# Patient Record
Sex: Female | Born: 1935
Health system: Southern US, Community
[De-identification: ages and names within clinical notes are randomized; demographics above are authoritative.]

## PROBLEM LIST (undated history)

## (undated) DIAGNOSIS — K635 Polyp of colon: Secondary | ICD-10-CM

## (undated) DIAGNOSIS — Z803 Family history of malignant neoplasm of breast: Secondary | ICD-10-CM

## (undated) DIAGNOSIS — I499 Cardiac arrhythmia, unspecified: Secondary | ICD-10-CM

## (undated) DIAGNOSIS — R0989 Other specified symptoms and signs involving the circulatory and respiratory systems: Secondary | ICD-10-CM

## (undated) DIAGNOSIS — I4719 Other supraventricular tachycardia: Secondary | ICD-10-CM

## (undated) DIAGNOSIS — E785 Hyperlipidemia, unspecified: Secondary | ICD-10-CM

## (undated) DIAGNOSIS — I10 Essential (primary) hypertension: Secondary | ICD-10-CM

## (undated) DIAGNOSIS — Z800 Family history of malignant neoplasm of digestive organs: Secondary | ICD-10-CM

## (undated) DIAGNOSIS — I4819 Other persistent atrial fibrillation: Secondary | ICD-10-CM

## (undated) DIAGNOSIS — I471 Supraventricular tachycardia: Secondary | ICD-10-CM

## (undated) DIAGNOSIS — R112 Nausea with vomiting, unspecified: Secondary | ICD-10-CM

## (undated) DIAGNOSIS — M1990 Unspecified osteoarthritis, unspecified site: Secondary | ICD-10-CM

## (undated) DIAGNOSIS — Z9889 Other specified postprocedural states: Secondary | ICD-10-CM

## (undated) DIAGNOSIS — T8859XA Other complications of anesthesia, initial encounter: Secondary | ICD-10-CM

## (undated) HISTORY — DX: Family history of malignant neoplasm of digestive organs: Z80.0

## (undated) HISTORY — DX: Other specified symptoms and signs involving the circulatory and respiratory systems: R09.89

## (undated) HISTORY — DX: Supraventricular tachycardia: I47.1

## (undated) HISTORY — DX: Essential (primary) hypertension: I10

## (undated) HISTORY — PX: MOUTH SURGERY: SHX715

## (undated) HISTORY — PX: BREAST LUMPECTOMY: SHX2

## (undated) HISTORY — DX: Polyp of colon: K63.5

## (undated) HISTORY — PX: DILATION AND CURETTAGE OF UTERUS: SHX78

## (undated) HISTORY — DX: Family history of malignant neoplasm of breast: Z80.3

## (undated) HISTORY — DX: Other persistent atrial fibrillation: I48.19

## (undated) HISTORY — DX: Other supraventricular tachycardia: I47.19

## (undated) HISTORY — DX: Hyperlipidemia, unspecified: E78.5

## (undated) SURGERY — Surgical Case
Anesthesia: *Unknown

---

## 1999-06-13 ENCOUNTER — Other Ambulatory Visit: Admission: RE | Admit: 1999-06-13 | Discharge: 1999-06-13 | Payer: Self-pay | Admitting: *Deleted

## 2003-10-31 DIAGNOSIS — R0989 Other specified symptoms and signs involving the circulatory and respiratory systems: Secondary | ICD-10-CM

## 2003-10-31 HISTORY — DX: Other specified symptoms and signs involving the circulatory and respiratory systems: R09.89

## 2004-01-18 ENCOUNTER — Other Ambulatory Visit: Admission: RE | Admit: 2004-01-18 | Discharge: 2004-01-18 | Payer: Self-pay | Admitting: *Deleted

## 2004-02-05 ENCOUNTER — Encounter: Admission: RE | Admit: 2004-02-05 | Discharge: 2004-02-05 | Payer: Self-pay | Admitting: Internal Medicine

## 2004-10-30 DIAGNOSIS — K635 Polyp of colon: Secondary | ICD-10-CM

## 2004-10-30 HISTORY — PX: COLONOSCOPY W/ POLYPECTOMY: SHX1380

## 2004-10-30 HISTORY — DX: Polyp of colon: K63.5

## 2004-12-23 ENCOUNTER — Ambulatory Visit: Payer: Self-pay | Admitting: Internal Medicine

## 2005-01-24 ENCOUNTER — Ambulatory Visit: Payer: Self-pay | Admitting: Internal Medicine

## 2005-06-22 ENCOUNTER — Ambulatory Visit: Payer: Self-pay | Admitting: Internal Medicine

## 2005-07-25 ENCOUNTER — Ambulatory Visit: Payer: Self-pay | Admitting: Internal Medicine

## 2005-08-04 ENCOUNTER — Ambulatory Visit: Payer: Self-pay | Admitting: Internal Medicine

## 2005-08-15 ENCOUNTER — Ambulatory Visit: Payer: Self-pay | Admitting: Internal Medicine

## 2005-08-18 ENCOUNTER — Encounter (INDEPENDENT_AMBULATORY_CARE_PROVIDER_SITE_OTHER): Payer: Self-pay | Admitting: *Deleted

## 2005-08-18 ENCOUNTER — Ambulatory Visit: Payer: Self-pay | Admitting: Internal Medicine

## 2006-01-25 ENCOUNTER — Ambulatory Visit: Payer: Self-pay | Admitting: Internal Medicine

## 2006-02-23 ENCOUNTER — Ambulatory Visit: Payer: Self-pay | Admitting: Internal Medicine

## 2006-03-20 ENCOUNTER — Ambulatory Visit: Payer: Self-pay | Admitting: Internal Medicine

## 2006-09-03 ENCOUNTER — Ambulatory Visit: Payer: Self-pay | Admitting: Internal Medicine

## 2007-09-05 DIAGNOSIS — Z9889 Other specified postprocedural states: Secondary | ICD-10-CM

## 2007-09-05 DIAGNOSIS — R0989 Other specified symptoms and signs involving the circulatory and respiratory systems: Secondary | ICD-10-CM

## 2007-09-10 ENCOUNTER — Ambulatory Visit: Payer: Self-pay | Admitting: Internal Medicine

## 2007-09-13 DIAGNOSIS — M810 Age-related osteoporosis without current pathological fracture: Secondary | ICD-10-CM | POA: Insufficient documentation

## 2007-10-29 ENCOUNTER — Encounter: Payer: Self-pay | Admitting: Internal Medicine

## 2007-11-21 ENCOUNTER — Ambulatory Visit: Payer: Self-pay | Admitting: Internal Medicine

## 2007-11-21 DIAGNOSIS — I10 Essential (primary) hypertension: Secondary | ICD-10-CM | POA: Insufficient documentation

## 2007-11-21 DIAGNOSIS — E782 Mixed hyperlipidemia: Secondary | ICD-10-CM

## 2007-11-21 LAB — CONVERTED CEMR LAB
Cholesterol, target level: 200 mg/dL
LDL Goal: 160 mg/dL

## 2007-11-23 LAB — CONVERTED CEMR LAB
HDL: 55.1 mg/dL (ref >39.0)
TSH: 1.13 microintl units/mL (ref 0.35–5.50)
Total CHOL/HDL Ratio: 3.6
Triglycerides: 96 mg/dL (ref 0–149)
VLDL: 19 mg/dL (ref 0–40)
Vit D, 1,25-Dihydroxy: 31 (ref 30–89)

## 2007-11-25 ENCOUNTER — Encounter (INDEPENDENT_AMBULATORY_CARE_PROVIDER_SITE_OTHER): Payer: Self-pay | Admitting: *Deleted

## 2007-12-10 ENCOUNTER — Encounter (INDEPENDENT_AMBULATORY_CARE_PROVIDER_SITE_OTHER): Payer: Self-pay | Admitting: *Deleted

## 2007-12-10 ENCOUNTER — Ambulatory Visit: Payer: Self-pay | Admitting: Internal Medicine

## 2008-03-10 ENCOUNTER — Encounter (INDEPENDENT_AMBULATORY_CARE_PROVIDER_SITE_OTHER): Payer: Self-pay | Admitting: *Deleted

## 2008-04-15 ENCOUNTER — Encounter: Payer: Self-pay | Admitting: Internal Medicine

## 2008-04-15 ENCOUNTER — Ambulatory Visit: Payer: Self-pay | Admitting: Internal Medicine

## 2008-04-27 ENCOUNTER — Encounter (INDEPENDENT_AMBULATORY_CARE_PROVIDER_SITE_OTHER): Payer: Self-pay | Admitting: *Deleted

## 2008-05-12 ENCOUNTER — Ambulatory Visit: Payer: Self-pay | Admitting: Internal Medicine

## 2008-12-15 ENCOUNTER — Ambulatory Visit: Payer: Self-pay | Admitting: Internal Medicine

## 2008-12-15 DIAGNOSIS — Z860100 Personal history of colon polyps, unspecified: Secondary | ICD-10-CM | POA: Insufficient documentation

## 2008-12-15 DIAGNOSIS — H8110 Benign paroxysmal vertigo, unspecified ear: Secondary | ICD-10-CM

## 2008-12-15 DIAGNOSIS — R002 Palpitations: Secondary | ICD-10-CM

## 2008-12-23 ENCOUNTER — Ambulatory Visit: Payer: Self-pay | Admitting: Internal Medicine

## 2009-01-04 LAB — CONVERTED CEMR LAB
ALT: 17 units/L (ref 0–35)
AST: 22 units/L (ref 0–37)
Alkaline Phosphatase: 29 units/L — ABNORMAL LOW (ref 39–117)
Basophils Relative: 0.8 % (ref 0.0–3.0)
Bilirubin, Direct: 0.2 mg/dL (ref 0.0–0.3)
Cholesterol: 195 mg/dL (ref 0–200)
Creatinine, Ser: 0.8 mg/dL (ref 0.4–1.2)
Eosinophils Relative: 4.1 % (ref 0.0–5.0)
GFR calc Af Amer: 90 mL/min
GFR calc non Af Amer: 75 mL/min
HCT: 41.1 % (ref 36.0–46.0)
HDL: 57.7 mg/dL (ref >39.0)
LDL Cholesterol: 129 mg/dL — ABNORMAL HIGH (ref 0–99)
MCV: 90.4 fL (ref 78.0–100.0)
Monocytes Absolute: 0.4 10*3/uL (ref 0.1–1.0)
Monocytes Relative: 9.2 % (ref 3.0–12.0)
Neutro Abs: 1.7 10*3/uL (ref 1.4–7.7)
Neutrophils Relative %: 38.5 % — ABNORMAL LOW (ref 43.0–77.0)
Platelets: 189 10*3/uL (ref 150–400)
Potassium: 4.9 meq/L (ref 3.5–5.1)
RDW: 12.2 % (ref 11.5–14.6)
TSH: 1.88 microintl units/mL (ref 0.35–5.50)
Total Bilirubin: 1.1 mg/dL (ref 0.3–1.2)
Total CHOL/HDL Ratio: 3.4
Total Protein: 6.3 g/dL (ref 6.0–8.3)
Triglycerides: 44 mg/dL (ref 0–149)

## 2009-01-08 ENCOUNTER — Encounter (INDEPENDENT_AMBULATORY_CARE_PROVIDER_SITE_OTHER): Payer: Self-pay | Admitting: *Deleted

## 2009-01-08 ENCOUNTER — Telehealth (INDEPENDENT_AMBULATORY_CARE_PROVIDER_SITE_OTHER): Payer: Self-pay | Admitting: *Deleted

## 2009-02-23 ENCOUNTER — Telehealth (INDEPENDENT_AMBULATORY_CARE_PROVIDER_SITE_OTHER): Payer: Self-pay | Admitting: *Deleted

## 2009-03-15 ENCOUNTER — Ambulatory Visit: Payer: Self-pay | Admitting: Internal Medicine

## 2009-03-25 LAB — CONVERTED CEMR LAB
ALT: 15 units/L (ref 0–35)
AST: 19 units/L (ref 0–37)
Albumin: 3.5 g/dL (ref 3.5–5.2)
Alkaline Phosphatase: 32 units/L — ABNORMAL LOW (ref 39–117)
Cholesterol: 161 mg/dL (ref 0–200)
LDL Cholesterol: 85 mg/dL (ref 0–99)
Total CHOL/HDL Ratio: 3
Total Protein: 6.7 g/dL (ref 6.0–8.3)

## 2009-03-26 ENCOUNTER — Encounter (INDEPENDENT_AMBULATORY_CARE_PROVIDER_SITE_OTHER): Payer: Self-pay | Admitting: *Deleted

## 2009-07-19 ENCOUNTER — Telehealth (INDEPENDENT_AMBULATORY_CARE_PROVIDER_SITE_OTHER): Payer: Self-pay | Admitting: *Deleted

## 2009-12-16 ENCOUNTER — Ambulatory Visit: Payer: Self-pay | Admitting: Internal Medicine

## 2009-12-17 ENCOUNTER — Encounter (INDEPENDENT_AMBULATORY_CARE_PROVIDER_SITE_OTHER): Payer: Self-pay | Admitting: *Deleted

## 2009-12-22 ENCOUNTER — Ambulatory Visit: Payer: Self-pay | Admitting: Internal Medicine

## 2009-12-22 LAB — CONVERTED CEMR LAB: Vit D, 25-Hydroxy: 43 ng/mL (ref 30–89)

## 2009-12-30 LAB — CONVERTED CEMR LAB
AST: 27 units/L (ref 0–37)
Alkaline Phosphatase: 31 units/L — ABNORMAL LOW (ref 39–117)
Basophils Absolute: 0.1 10*3/uL (ref 0.0–0.1)
Basophils Relative: 1.7 % (ref 0.0–3.0)
CO2: 32 meq/L (ref 19–32)
Cholesterol: 165 mg/dL (ref 0–200)
Free T4: 0.9 ng/dL (ref 0.6–1.6)
HCT: 42.4 % (ref 36.0–46.0)
HDL: 63.8 mg/dL (ref >39.00)
Hemoglobin: 13.9 g/dL (ref 12.0–15.0)
LDL Cholesterol: 86 mg/dL (ref 0–99)
MCV: 93.6 fL (ref 78.0–100.0)
Potassium: 4.8 meq/L (ref 3.5–5.1)
RBC: 4.53 M/uL (ref 3.87–5.11)
RDW: 12.5 % (ref 11.5–14.6)
VLDL: 15.2 mg/dL (ref 0.0–40.0)
WBC: 3.9 10*3/uL — ABNORMAL LOW (ref 4.5–10.5)

## 2010-01-31 ENCOUNTER — Encounter: Payer: Self-pay | Admitting: Internal Medicine

## 2010-08-19 ENCOUNTER — Telehealth (INDEPENDENT_AMBULATORY_CARE_PROVIDER_SITE_OTHER): Payer: Self-pay | Admitting: *Deleted

## 2010-11-27 LAB — CONVERTED CEMR LAB: Cholesterol, target level: 200 mg/dL

## 2010-11-29 NOTE — Progress Notes (Signed)
Summary: Refill Request  Phone Note Refill Request Message from:  Pharmacy  Refills Requested: Medication #1:  PRAVASTATIN SODIUM 20 MG TABS 1 qhs [facility] [phone]   Method Requested: Fax to Local Pharmacy Initial call taken by: Shonna Chock CMA,  August 19, 2010 4:10 PM  Follow-up for Phone Call        Paper fax was signed and faxed back Follow-up by: Shonna Chock CMA,  August 19, 2010 4:16 PM    Prescriptions: PRAVASTATIN SODIUM 20 MG TABS (PRAVASTATIN SODIUM) 1 qhs  #90 x 1   Entered by:   Shonna Chock CMA   Authorized by:   Marga Melnick MD   Signed by:   Shonna Chock CMA on 08/19/2010   Method used:   Historical   RxID:   [id number]

## 2010-11-29 NOTE — Letter (Signed)
Summary: Primary Care Consult Scheduled Letter  [facility]  [address] [place], [place] [address]   Phone: [phone]  Fax: [phone]      12/17/2009 MRN: [id number]  Molly Tucker [address] [place], [place]  [address]    Dear Ms. Tant,    We have scheduled an appointment for you.  At the recommendation of Dr. Marga Melnick, we have scheduled you for a Bone Density Scan at [facility] on 04-28-2010 at 9:00am.  Their address is [address] [place], [place] [address]. The office phone number is [phone].  If this appointment day and time is not convenient for you, please feel free to call the office of the doctor you are being referred to at the number listed above and reschedule the appointment.     It is important for you to keep your scheduled appointments. We are here to make sure you are given good patient care.   Thank you,    Renee, Patient Care Coordinator  [facility]

## 2010-11-29 NOTE — Assessment & Plan Note (Signed)
Summary: cpx/kdc   Vital Signs:  Patient profile:   77 year old female Weight:      139.6 pounds BMI:     24.43 Temp:     98.7 degrees F oral Resp:     14 per minute BP sitting:   160 / 88  (left arm) Cuff size:   large  Vitals Entered By: Shonna Chock (December 16, 2009 2:17 PM)  CC: CPX: order for fasting labs(near future), General Medical Evaluation Comments REVIEWED MED LIST, PATIENT AGREED DOSE AND INSTRUCTION CORRECT    CC:  CPX: order for fasting labs(near future) and General Medical Evaluation.  History of Present Illness: Molly Tucker is here for a UHC/Medicare physical;her  main issue is arthralgias responsive to 2 Tylenol.  Allergies (verified): No Known Drug Allergies  Past History:  Past Medical History: Osteoporosis, S/ P bisphonates X 5 yrs carotid bruit right 2005 ,Carotid Doppler: < 39% occlusion Colonic polyps, hx of,hyperplastic 2006,Dr Gessner,due 2016 Hyperlipidemia, LDL goal =< 115  Past Surgical History: gravida 3, para 2 A 1 dilation and curettage, history of Colon polypectomy, 2006, repeat due ? 2016 , Dr Leone Payor Oral Surgery 2009-2010(implants) ,Dr Dereck Leep   Family History: father CAD, died age 46 P aunts colon cancer, possible breast cancer sister breast cancer, CAD, S/P stent 2009 aunts arthritis  Social History: Never Smoked Retired Married Alcohol use-no Regular exercise-yes:  walking 30 min once daily   Review of Systems  The patient denies anorexia, fever, weight loss, weight gain, vision loss, decreased hearing, hoarseness, chest pain, syncope, dyspnea on exertion, peripheral edema, prolonged cough, headaches, hemoptysis, abdominal pain, melena, hematochezia, severe indigestion/heartburn, hematuria, incontinence, suspicious skin lesions, unusual weight change, abnormal bleeding, enlarged lymph nodes, and angioedema.         Occasional palpitations, non exertional. Bladder prolapse  treated  with pessary Rxed by Gyn.  Physical  Exam  General:  Appears younger than age,well-nourished, alert,appropriate and cooperative throughout examination Head:  Normocephalic and atraumatic without obvious abnormalities. Eyes:  No corneal or conjunctival inflammation noted. Marland Kitchen Perrla. Funduscopic exam benign, without hemorrhages, exudates or papilledema. No lid lag Ears:  External ear exam shows no significant lesions or deformities.  Otoscopic examination reveals clear canals, tympanic membranes are intact bilaterally without bulging, retraction, inflammation or discharge. Hearing is grossly normal bilaterally. Nose:  External nasal examination shows no deformity or inflammation. Nasal mucosa are pink and moist without lesions or exudates. Mouth:  Oral mucosa and oropharynx without lesions or exudates.  Teeth in  excellent  repair. Neck:  No deformities, masses, or tenderness noted.Faint bruit on R Lungs:  Normal respiratory effort, chest expands symmetrically. Lungs are clear to auscultation, no crackles or wheezes. Heart:  normal rate and irregular rhythm.  S4 with slurring  Abdomen:  Bowel sounds positive,abdomen soft and non-tender without masses, organomegaly or hernias noted. Aorta palpabble w/o AAA Genitalia:  as per Gyn Msk:  No deformity or scoliosis noted of thoracic or lumbar spine.   Pulses:  R and L carotid,radial,dorsalis pedis and posterior tibial pulses are full and equal bilaterally Extremities:  No clubbing, cyanosis, edema  noted with normal full range of motion of all joints.  OA hand changes Neurologic:  alert & oriented X3 and DTRs symmetrical and normal.  No tremor Skin:  Intact without suspicious lesions or rashes Cervical Nodes:  No lymphadenopathy noted Axillary Nodes:  No palpable lymphadenopathy Psych:  Oriented X3, memory intact for recent and remote, and normally interactive.  Impression & Recommendations:  Problem # 1:  PREVENTIVE HEALTH CARE (ICD-V70.0)  Orders: EKG w/ Interpretation  (93000) Radiology Referral (Radiology)  Problem # 2:  PALPITATIONS (ICD-785.1) Assessment: Unchanged  Orders: EKG w/ Interpretation (93000)  Her updated medication list for this problem includes:    Metoprolol Tartrate 25 Mg Tabs (Metoprolol tartrate) .Marland Kitchen... 1 two times a day if bp > 140/90  Problem # 3:  ELEVATED BLOOD PRESSURE WITHOUT DIAGNOSIS OF HYPERTENSION (ICD-796.2)  Orders: EKG w/ Interpretation (93000)  Her updated medication list for this problem includes:    Metoprolol Tartrate 25 Mg Tabs (Metoprolol tartrate) .Marland Kitchen... 1 two times a day if bp > 140/90  Problem # 4:  OSTEOPOROSIS (ICD-733.00)  Orders: Radiology Referral (Radiology)  Problem # 5:  COLONIC POLYPS, HX OF (ICD-V12.72) as per Dr Leone Payor  Complete Medication List: 1)  Asa 81mg   .... 1 by mouth qd 2)  Slo Niacin 500mg   .... Once daily 3)  Calcium 600/vitamin D 600-400 Mg-unit Tabs (Calcium carbonate-vitamin d) .Marland Kitchen.. 1 by mouth two times a day 4)  Vitamin D 1000 Unit Caps (Cholecalciferol) .Marland Kitchen.. 1 by mouth once daily 5)  Pravastatin Sodium 20 Mg Tabs (Pravastatin sodium) .Marland Kitchen.. 1 qhs 6)  Metoprolol Tartrate 25 Mg Tabs (Metoprolol tartrate) .Marland Kitchen.. 1 two times a day if bp > 140/90  Patient Instructions: 1)  Check your Blood Pressure regularly. If it is above: 140/90 ON AVERAGE  you should  start Metoprolol 25 mg two times a day . Avoid stimulants as discussed. Please schedule fasting labs: 2)  BMP ; 3)  Hepatic Panel ; 4)  Lipid Panel;free T4; 5)  TSH ;vitamin D level; 6)  CBC w/ Diff prior to visit, ICD-9: Prescriptions: METOPROLOL TARTRATE 25 MG TABS (METOPROLOL TARTRATE) 1 two times a day if BP > 140/90  #60 x 5   Entered and Authorized by:   Marga Melnick MD   Signed by:   Marga Melnick MD on 12/16/2009   Method used:   Print then Give to Patient   RxID:   [id number]

## 2010-11-29 NOTE — Medication Information (Signed)
Summary: Possible Nonadherence with Metoprolol Tartrate/[facility]  Possible Nonadherence with Metoprolol Tartrate/[facility]   Imported By: Lanelle Bal 02/04/2010 10:54:29  _____________________________________________________________________  External Attachment:    Type:   Image     Comment:   External Document

## 2011-03-07 ENCOUNTER — Other Ambulatory Visit: Payer: Self-pay | Admitting: *Deleted

## 2011-03-07 MED ORDER — PRAVASTATIN SODIUM 20 MG PO TABS: 20.0000 mg | ORAL_TABLET | Freq: Every day | ORAL | Status: DC

## 2011-08-21 ENCOUNTER — Encounter: Payer: Self-pay | Admitting: Internal Medicine

## 2011-08-22 ENCOUNTER — Ambulatory Visit (INDEPENDENT_AMBULATORY_CARE_PROVIDER_SITE_OTHER): Payer: Medicare Other | Admitting: Internal Medicine

## 2011-08-22 ENCOUNTER — Encounter: Payer: Self-pay | Admitting: Internal Medicine

## 2011-08-22 VITALS — BP 148/90 | HR 79 | Temp 98.2°F | Resp 14 | Ht 64.0 in | Wt 138.8 lb

## 2011-08-22 DIAGNOSIS — Z23 Encounter for immunization: Secondary | ICD-10-CM

## 2011-08-22 DIAGNOSIS — M810 Age-related osteoporosis without current pathological fracture: Secondary | ICD-10-CM

## 2011-08-22 DIAGNOSIS — E782 Mixed hyperlipidemia: Secondary | ICD-10-CM

## 2011-08-22 DIAGNOSIS — Z0000 Encounter for general adult medical examination without abnormal findings: Secondary | ICD-10-CM

## 2011-08-22 DIAGNOSIS — Z86010 Personal history of colonic polyps: Secondary | ICD-10-CM

## 2011-08-22 DIAGNOSIS — R030 Elevated blood-pressure reading, without diagnosis of hypertension: Secondary | ICD-10-CM

## 2011-08-22 LAB — LIPID PANEL
Cholesterol: 197 mg/dL (ref 0–200)
LDL Cholesterol: 121 mg/dL — ABNORMAL HIGH (ref 0–99)

## 2011-08-22 LAB — BASIC METABOLIC PANEL
BUN: 13 mg/dL (ref 6–23)
Calcium: 8.8 mg/dL (ref 8.4–10.5)
Chloride: 104 mEq/L (ref 96–112)
Creatinine, Ser: 0.7 mg/dL (ref 0.4–1.2)
Potassium: 3.9 mEq/L (ref 3.5–5.1)
Sodium: 141 mEq/L (ref 135–145)

## 2011-08-22 LAB — HEPATIC FUNCTION PANEL
ALT: 15 U/L (ref 0–35)
Albumin: 4 g/dL (ref 3.5–5.2)
Total Bilirubin: 1 mg/dL (ref 0.3–1.2)

## 2011-08-22 LAB — CBC WITH DIFFERENTIAL/PLATELET
Basophils Relative: 0.7 % (ref 0.0–3.0)
Eosinophils Relative: 1.4 % (ref 0.0–5.0)
HCT: 41.5 % (ref 36.0–46.0)
Lymphocytes Relative: 33.1 % (ref 12.0–46.0)
Monocytes Relative: 8.1 % (ref 3.0–12.0)
Neutro Abs: 2.5 10*3/uL (ref 1.4–7.7)
Neutrophils Relative %: 56.7 % (ref 43.0–77.0)
RBC: 4.57 Mil/uL (ref 3.87–5.11)
RDW: 13.2 % (ref 11.5–14.6)

## 2011-08-22 NOTE — Progress Notes (Signed)
Subjective:    Patient ID: Molly Tucker, female    DOB: Mar 27, 1932, 77 y.o.   MRN: [id number]  HPI Medicare Wellness Visit:  The following psychosocial & medical history were reviewed as required by Medicare.   Social history: caffeine: 2 cups / day , alcohol:  no ,  tobacco use : quit 1978  & exercise : walking 6 X/ week > 30 min.   Home & personal  safety / fall risk: no issues, activities of daily living: no , seatbelt use : yes , and smoke alarm employment : yes .  Power of Attorney/Living Will status : inplace  Vision ( as recorded per Nurse) & Hearing  evaluation :  Last Ophth exam < 12 months, no issues.Whisper acuity slightly decreased ,L >R. Orientation :oriented x 3 , memory & recall :good, spelling & math   testing: good,and mood & affect : normal . Depression / anxiety: no issues Travel history : [place] 1997  , immunization status :no Shingles; Flu declined (risk discussed) , transfusion history:  no, and preventive health surveillance ( colonoscopies, BMD , etc as per protocol/ [facility]): hyperplastic polyp 2006, Dental care:  Every 6 months . Chart reviewed &  Updated. Active issues reviewed & addressed.       Review of Systems Dyslipidemia assessment: Prior Advanced Lipid Testing: NMR 2007.   Family history of premature CAD/ MI: no , father MI @ 31.  Nutrition: no plan .   Diabetes : no . HTN: BP @ home averages 125/70s.   Weight :  stable. ROS: fatigue: no ; chest pain : no ;claudication: no; palpitations: no; abd pain/bowel changes: no ; myalgias:no;  syncope : no ; memory loss: no;skin changes: no. Note: OFF statin X 2 months    Objective:   Physical Exam Gen.: Healthy and well-nourished in appearance. Alert, appropriate and cooperative throughout exam. Head: Normocephalic without obvious abnormalities Eyes: No corneal or conjunctival inflammation noted. Pupils equal round reactive to light and accommodation.  Extraocular motion intact.  Ears: External  ear exam  reveals no significant lesions or deformities. Canals clear .TMs normal.  Nose: External nasal exam reveals no deformity or inflammation. Nasal mucosa are pink and moist. No lesions or exudates noted.  Mouth: Oral mucosa and oropharynx reveal no lesions or exudates. Teeth in good repair.Osteoma of hard palate Neck: No deformities, masses, or tenderness noted. Range of motion essentially normal. Thyroid L lobe > R w/o nodules. Lungs: Normal respiratory effort; chest expands symmetrically. Lungs are clear to auscultation without rales, wheezes, or increased work of breathing. Heart: Normal rate and rhythm. Normal S1 and S2. No gallop, click, or rub. No murmur. Abdomen: Bowel sounds normal; abdomen soft and nontender. No masses, organomegaly or hernias noted. Genitalia: Debbora Dus , NP   .                                                                                   Musculoskeletal/extremities: No deformity or scoliosis noted of  the thoracic or lumbar spine. No clubbing, cyanosis, edema  noted. Range of motion  normal .Tone & strength  Normal.Joints: OA changes RUE > LUE. Nail health  good. Vascular:  Carotid, radial artery, dorsalis pedis and  posterior tibial pulses are full and equal. Faint R carotid  Bruit present. Neurologic: Alert and oriented x3. Deep tendon reflexes symmetrical and normal.         Skin: Intact without suspicious lesions or rashes. Lymph: No cervical, axillary lymphadenopathy present. Psych: Mood and affect are normal. Normally interactive                                                                                        Assessment & Plan:  #1 Medicare Wellness Exam; criteria met ; data entered #2 Problem List reviewed ; Assessment/ Recommendations made Plan: see Orders

## 2011-08-22 NOTE — Patient Instructions (Signed)
Blood Pressure Goal  Ideally is an AVERAGE < 135/85. This AVERAGE should be calculated from @ least 5-7 BP readings taken @ different times of day on different days of week. You should not respond to isolated BP readings , but rather the AVERAGE for that week Preventive Health Care: Exercise  30-45  minutes a day, 3-4 days a week. Walking is especially valuable in preventing Osteoporosis. Eat a low-fat diet with lots of fruits and vegetables, up to 7-9 servings per day. Consume less than 30 grams of sugar per day from foods & drinks with High Fructose Corn Syrup as # 1,2,3 or #4 on label.

## 2011-08-23 LAB — VITAMIN D 25 HYDROXY (VIT D DEFICIENCY, FRACTURES): Vit D, 25-Hydroxy: 46 ng/mL (ref 30–89)

## 2011-08-31 ENCOUNTER — Ambulatory Visit (INDEPENDENT_AMBULATORY_CARE_PROVIDER_SITE_OTHER): Payer: Medicare Other

## 2011-08-31 DIAGNOSIS — Z23 Encounter for immunization: Secondary | ICD-10-CM

## 2012-01-22 ENCOUNTER — Telehealth: Payer: Self-pay | Admitting: Internal Medicine

## 2012-01-22 NOTE — Telephone Encounter (Signed)
Caller: Judea/Patient; PCP: Marga Melnick; CB#: [phone];  Calling today 01/22/12 regarding she is having nausea and diarrhea, onset around 2 AM today.  Has taken some anti-diarrhea medication and this has improved.  Still having nausea.  Has vomited about 4 times.  Has been taking some OTC nausea medication but still having nausea.  Vomiting has improved.  Afebrile. Emergent symptoms r/o by Nausea or Vomiting guidelines with exception of new onset of 3-4 episodes of vomiting or diarrhea following mild abdominal cramping.  Home care advice given.

## 2012-01-22 NOTE — Telephone Encounter (Signed)
Stay on clear liquids for 48-72 hours or until bowels are normal.This would include  jello, sherbert (NOT ice cream), Lipton's chicken noodle soup(NOT cream based soups),Gatorade Lite, flat Ginger ale (without High Fructose Corn Syrup),dry toast or crackers, baked potato.No milk , dairy or grease until bowels are formed. Align , a [employer] , daily if stools are loose. Immodium AD for frankly watery stool. To ER for increasing pain, fever or rectal bleeding

## 2012-01-23 NOTE — Telephone Encounter (Signed)
Discuss with patient  

## 2012-11-14 ENCOUNTER — Ambulatory Visit (INDEPENDENT_AMBULATORY_CARE_PROVIDER_SITE_OTHER): Payer: Medicare Other

## 2012-11-14 DIAGNOSIS — Z23 Encounter for immunization: Secondary | ICD-10-CM

## 2013-11-21 ENCOUNTER — Encounter: Payer: Self-pay | Admitting: Internal Medicine

## 2013-11-21 ENCOUNTER — Ambulatory Visit (INDEPENDENT_AMBULATORY_CARE_PROVIDER_SITE_OTHER): Payer: Medicare PPO | Admitting: Internal Medicine

## 2013-11-21 VITALS — BP 181/76 | HR 101 | Temp 98.9°F | Resp 13 | Wt 139.6 lb

## 2013-11-21 DIAGNOSIS — R0989 Other specified symptoms and signs involving the circulatory and respiratory systems: Secondary | ICD-10-CM

## 2013-11-21 DIAGNOSIS — R030 Elevated blood-pressure reading, without diagnosis of hypertension: Secondary | ICD-10-CM

## 2013-11-21 DIAGNOSIS — Z23 Encounter for immunization: Secondary | ICD-10-CM

## 2013-11-21 DIAGNOSIS — I10 Essential (primary) hypertension: Secondary | ICD-10-CM

## 2013-11-21 DIAGNOSIS — I4949 Other premature depolarization: Secondary | ICD-10-CM

## 2013-11-21 LAB — BASIC METABOLIC PANEL
BUN: 17 mg/dL (ref 6–23)
CO2: 27 mEq/L (ref 19–32)
Calcium: 9.3 mg/dL (ref 8.4–10.5)
Chloride: 104 mEq/L (ref 96–112)
Creat: 0.75 mg/dL (ref 0.50–1.10)
Glucose, Bld: 94 mg/dL (ref 70–99)
Potassium: 4.1 mEq/L (ref 3.5–5.3)
Sodium: 140 mEq/L (ref 135–145)

## 2013-11-21 MED ORDER — METOPROLOL TARTRATE 25 MG PO TABS: 25.0000 mg | ORAL_TABLET | Freq: Two times a day (BID) | ORAL | Status: DC

## 2013-11-21 NOTE — Progress Notes (Signed)
     Patient ID: Molly Tucker, female    DOB: 01-15-1932, 80 y.o.   MRN: [id number]  HPI Blood pressure range / average - reports 767M systolic at home; rare home readings.  3 caffeine beverages/day; increased chocolate since holidays; infrequent exercise currently, although reports she typically walks 30 minutes several times per day.  Non compliant with  Beta blocker anti hypertensive medication. Reports she feels she does not need it with readings in 094B systolic at home. Reports    Review of Systems  Significant headaches, epistaxis, chest pain, palpitations, exertional dyspnea, claudication, paroxysmal nocturnal dyspnea, or edema absent. No lightheadedness ,paresthesias, or other neurologic symptoms described.  Intermittent flushing on Niacin         Objective:   Physical Exam Appears healthy and well-nourished & in no acute distress  Fundi difficult to visualize  No carotid bruits are present.No neck pain distention present at 10 - 15 degrees. Thyroid normal to palpation  Heart rhythm and rate are normal with no significant murmurs or gallops.S4 with slurring. Occasional prematures(none on EKG) Repeat BP 160/80.  Chest is clear with no increased work of breathing  There is no evidence of aortic aneurysm or renal artery bruits  Abdomen soft with no organomegaly or masses. No HJR.Aorta palpable ; no AAA  No clubbing, cyanosis or edema present. Mixed arthritic joint changes  Pedal pulses are intact   No ischemic skin changes are present . Nails healthy .  Alert and oriented. Strength, tone, DTRs reflexes normal          Assessment & Plan:  See Current Assessment & Plan in Problem List under specific Diagnosis

## 2013-11-21 NOTE — Assessment & Plan Note (Signed)
Non compliance with meds. Blood pressure goals and risk discussed. EKG does not reveal hypertensive changes. It does show some changes and the P-wave morphology compared to 2012. BMET

## 2013-11-21 NOTE — Progress Notes (Signed)
   Subjective:    Patient ID: Molly Tucker, female    DOB: 1932-06-15, 81 y.o.   MRN: [id number]  HPI Reports sensation of heart rate pausing at times, described as an anxious feeling in neck.  Reports irregular home BP readings, state usually 884Z systolic. Has not been taking Metoprolol for last year; reports she does not feel she needs it with readings in 120s.  Typically walks for 30 minute several times/ week. Not exercising regularly since holidays. Reports 3 caffeine beverages/day, although has tried to reduce amounts within last couple of days. Increased chocolate over holidays; specifically denies dysphagia. Dyspepsia usually one occurrence/week, untreated.  Some flushing noted with Niacin. Specifically denies diarrhea, n/v.   Review of Systems Denies palpitations, chest pain, dyspnea, paroxymal nocturnal dyspnea, claudication.      Objective:   Physical Exam        Assessment & Plan:

## 2013-11-21 NOTE — Addendum Note (Signed)
Addended by: Modena Morrow D on: 11/21/2013 03:53 PM   Modules accepted: Orders

## 2013-11-21 NOTE — Assessment & Plan Note (Addendum)
Not documented on exam;hold on Doppler

## 2013-11-21 NOTE — Patient Instructions (Signed)
Minimal Blood Pressure Goal= AVERAGE < 140/90;  Ideal is an AVERAGE < 135/85. This AVERAGE should be calculated from @ least 5-7 BP readings taken @ different times of day on different days of week. You should not respond to isolated BP readings , but rather the AVERAGE for that week .Please bring your  blood pressure cuff to office visits to verify that it is reliable.It  can also be checked against the blood pressure device at the pharmacy. Finger or wrist cuffs are not dependable; an arm cuff is. Fill the  prescription for the BP medication if BP NOT @ goal based on  7 to 14 day average. See Dr Delman Cheadle for eye exam. To prevent palpitations or premature beats, avoid stimulants such as decongestants, diet pills, nicotine, or caffeine (coffee, tea, cola, or chocolate) to excess.

## 2013-11-21 NOTE — Progress Notes (Signed)
Pre visit review using our clinic review tool, if applicable. No additional management support is needed unless otherwise documented below in the visit note. 

## 2013-11-24 ENCOUNTER — Telehealth: Payer: Self-pay | Admitting: Internal Medicine

## 2013-11-24 NOTE — Telephone Encounter (Signed)
Relevant patient education assigned to patient using Emmi. ° °

## 2013-12-22 ENCOUNTER — Other Ambulatory Visit: Payer: Self-pay | Admitting: Internal Medicine

## 2013-12-22 ENCOUNTER — Encounter: Payer: Self-pay | Admitting: Internal Medicine

## 2013-12-22 DIAGNOSIS — I10 Essential (primary) hypertension: Secondary | ICD-10-CM

## 2015-02-22 ENCOUNTER — Ambulatory Visit (INDEPENDENT_AMBULATORY_CARE_PROVIDER_SITE_OTHER): Payer: Medicare PPO | Admitting: Internal Medicine

## 2015-02-22 ENCOUNTER — Ambulatory Visit (INDEPENDENT_AMBULATORY_CARE_PROVIDER_SITE_OTHER)
Admission: RE | Admit: 2015-02-22 | Discharge: 2015-02-22 | Disposition: A | Discharge status: Home / Self Care | Payer: Medicare PPO | Source: Ambulatory Visit | Attending: Internal Medicine | Admitting: Internal Medicine

## 2015-02-22 ENCOUNTER — Other Ambulatory Visit (INDEPENDENT_AMBULATORY_CARE_PROVIDER_SITE_OTHER): Payer: Medicare PPO

## 2015-02-22 ENCOUNTER — Encounter: Payer: Self-pay | Admitting: Internal Medicine

## 2015-02-22 VITALS — BP 142/72 | HR 98 | Temp 98.1°F | Resp 16 | Ht 64.0 in | Wt 137.8 lb

## 2015-02-22 DIAGNOSIS — R000 Tachycardia, unspecified: Secondary | ICD-10-CM

## 2015-02-22 DIAGNOSIS — R0600 Dyspnea, unspecified: Secondary | ICD-10-CM

## 2015-02-22 DIAGNOSIS — I4891 Unspecified atrial fibrillation: Secondary | ICD-10-CM

## 2015-02-22 LAB — CBC WITH DIFFERENTIAL/PLATELET
Basophils Absolute: 0 10*3/uL (ref 0.0–0.1)
Basophils Relative: 0.4 % (ref 0.0–3.0)
Eosinophils Absolute: 0 10*3/uL (ref 0.0–0.7)
Eosinophils Relative: 0.4 % (ref 0.0–5.0)
HCT: 42.9 % (ref 36.0–46.0)
HEMOGLOBIN: 14.4 g/dL (ref 12.0–15.0)
LYMPHS PCT: 20.2 % (ref 12.0–46.0)
Lymphs Abs: 1.3 10*3/uL (ref 0.7–4.0)
MCHC: 33.5 g/dL (ref 30.0–36.0)
MCV: 89 fl (ref 78.0–100.0)
MONO ABS: 0.4 10*3/uL (ref 0.1–1.0)
Monocytes Relative: 6.3 % (ref 3.0–12.0)
Neutro Abs: 4.6 10*3/uL (ref 1.4–7.7)
Neutrophils Relative %: 72.7 % (ref 43.0–77.0)
PLATELETS: 205 10*3/uL (ref 150.0–400.0)
RBC: 4.82 Mil/uL (ref 3.87–5.11)
RDW: 14.1 % (ref 11.5–15.5)
WBC: 6.3 10*3/uL (ref 4.0–10.5)

## 2015-02-22 LAB — BASIC METABOLIC PANEL
BUN: 15 mg/dL (ref 6–23)
CO2: 27 meq/L (ref 19–32)
CREATININE: 0.78 mg/dL (ref 0.40–1.20)
Calcium: 9.5 mg/dL (ref 8.4–10.5)
Chloride: 106 mEq/L (ref 96–112)
GFR: 75.31 mL/min (ref >60.00)
Glucose, Bld: 92 mg/dL (ref 70–99)
Potassium: 4.6 mEq/L (ref 3.5–5.1)
SODIUM: 140 meq/L (ref 135–145)

## 2015-02-22 LAB — T4, FREE: Free T4: 1.07 ng/dL (ref 0.60–1.60)

## 2015-02-22 LAB — TSH: TSH: 1.02 u[IU]/mL (ref 0.35–4.50)

## 2015-02-22 LAB — MAGNESIUM: Magnesium: 2.2 mg/dL (ref 1.5–2.5)

## 2015-02-22 NOTE — Progress Notes (Signed)
   Subjective:    Patient ID: Molly Tucker, female    DOB: 1932/02/02, 81 y.o.   MRN: [id number]  HPI She has had exertional dyspnea for approximately 2 weeks. 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 Once home she was the primary caregiver initially and noted progression of the exertional dyspnea. This was worse climbing stairs. She also noted intermittent skipping of her heart but this is more of a chronic issue.  Initially she did have some low back symptoms without associated neuromuscular complications. This resolved as her children have taken over as primary care providers.  Review of Systems  Chest pain,  paroxysmal nocturnal dyspnea, claudication or edema are absent. Unexplained weight loss, abdominal pain, significant dyspepsia, dysphagia, melena, rectal bleeding, or persistently small caliber stools are denied. There is no significant cough, sputum production, or wheezing. Vertigo, near syncope or imbalance denied. There is no numbness, tingling, or weakness in extremities.   No loss of control of bladder or bowels. Radicular type pain absent. No seizure stigmata.     Objective:   Physical Exam Pertinent or positive findings include: Arcus senilis is present.  Heart rhythm is rapid and irregular.  She has myriad cherry angiomas over the upper abdomen. She has venous spiders over the lower extremities distally. Pedal pulses are decreased. Mixed PIP/DIP arthritic changes present.  General appearance :adequately nourished; in no distress.Appears younger than stated age Eyes: No conjunctival inflammation or scleral icterus is present. Oral exam:  Lips and gums are healthy appearing.There is no oropharyngeal erythema or exudate noted. Dental hygiene is good. Heart:  No murmur, click, or rub . Lungs:Chest clear to auscultation; no wheezes, rhonchi,rales ,or rubs present.No increased work of breathing.    Abdomen: bowel sounds normal, soft and non-tender without masses, organomegaly or hernias noted.  No guarding or rebound.  Vascular : all pulses equal ; no bruits present. Skin:Warm & dry.  Intact without suspicious lesions or rashes ; no tenting or jaundice  Lymphatic: No lymphadenopathy is noted about the head, neck, axilla Neuro: Strength, tone & DTRs normal.         Assessment & Plan:  #1 dyspnea  #2 atrial fib, new onset See orders

## 2015-02-22 NOTE — Patient Instructions (Addendum)
  Your next office appointment will be determined based upon review of your pending labs & xrays  Those instructions will be transmitted to you by My Chart   Critical results will be called.   Followup as needed for any active or acute issue. Please report any significant change in your symptoms.    I recommend a Cardiology consultation for the atrial fib.  Please take enteric-coated aspirin 325 mg daily with breakfast.

## 2015-02-22 NOTE — Progress Notes (Signed)
Pre visit review using our clinic review tool, if applicable. No additional management support is needed unless otherwise documented below in the visit note. 

## 2015-02-23 ENCOUNTER — Telehealth: Payer: Self-pay

## 2015-02-23 ENCOUNTER — Other Ambulatory Visit: Payer: Self-pay | Admitting: Internal Medicine

## 2015-02-23 DIAGNOSIS — I4891 Unspecified atrial fibrillation: Secondary | ICD-10-CM

## 2015-02-23 DIAGNOSIS — I5031 Acute diastolic (congestive) heart failure: Secondary | ICD-10-CM

## 2015-02-23 MED ORDER — FUROSEMIDE 40 MG PO TABS: 40.0000 mg | ORAL_TABLET | Freq: Every day | ORAL | Status: DC

## 2015-02-23 NOTE — Telephone Encounter (Signed)
-----   Message from Hendricks Limes, MD sent at 02/23/2015  7:07 AM EDT ----- Furosemide 40 mg daily #30

## 2015-03-01 ENCOUNTER — Ambulatory Visit (INDEPENDENT_AMBULATORY_CARE_PROVIDER_SITE_OTHER): Payer: Medicare PPO | Admitting: Internal Medicine

## 2015-03-01 ENCOUNTER — Encounter: Payer: Self-pay | Admitting: Internal Medicine

## 2015-03-01 VITALS — BP 100/62 | HR 47 | Ht 64.0 in | Wt 133.8 lb

## 2015-03-01 DIAGNOSIS — I4891 Unspecified atrial fibrillation: Secondary | ICD-10-CM | POA: Diagnosis not present

## 2015-03-01 MED ORDER — APIXABAN 5 MG PO TABS: 5.0000 mg | ORAL_TABLET | Freq: Two times a day (BID) | ORAL | Status: DC

## 2015-03-01 MED ORDER — DILTIAZEM HCL ER COATED BEADS 120 MG PO CP24: 120.0000 mg | ORAL_CAPSULE | Freq: Every day | ORAL | Status: DC

## 2015-03-01 NOTE — Progress Notes (Signed)
ELECTROPHYSIOLOGY CONSULT NOTE  Patient ID: Molly Tucker, MRN: [id number], DOB/AGE: 79-Jun-1933 79 y.o. Admit date: (Not on file) Date of Consult: 03/01/2015  Primary Physician: Unice Cobble, MD Primary Cardiologist: new  Chief Complaint: zafib   HPI Molly Tucker is a 81 y.o. female  Referred for newly identified atrial fibrillation   She ws seen by her PCP for complaints of dyspnea and ECG demonstrated atrial fibrillation with a rapid rate and CXR showed CHF   Started on furosemide without improvement  She was largely unaware of palpiations with this issue, although she notes them for the past 50 years--wth a sense of pause.    No HTN DM/ neuro symptoms    CHADSVAS 3 ( age-82 gender-1)  She has noted a problem with L arm/shoulder with exertion that may or maynot antedate the atrial fibrillation   Past Medical History  Diagnosis Date  . Osteoporosis     S/P  biphosphonates x 5 yrs  . Carotid bruit 2005    right, carotid doppler: <39% occlusion  . Hyperplastic colonic polyp 2006    Dr. Carlean Purl, due 2016  . Hyperlipidemia     LDL goal =<120      Surgical History:  Past Surgical History  Procedure Laterality Date  . Dilation and curettage of uterus    . Mouth surgery  2009-2010    implants, Dr. Marcelyn Ditty  . Colonoscopy w/ polypectomy  2006     Home Meds: Prior to Admission medications   Medication Sig Start Date End Date Taking? Authorizing Provider  Calcium Carbonate-Vitamin D (CALCIUM 600+D) 600-400 MG-UNIT per tablet Take 1 tablet by mouth 2 (two) times daily.     Yes Historical Provider, MD  furosemide (LASIX) 40 MG tablet Take 1 tablet (40 mg total) by mouth daily. 02/23/15  Yes Hendricks Limes, MD  Multiple Vitamin (MULTIVITAMIN) tablet Take 1 tablet by mouth daily.   Yes Historical Provider, MD      Allergies: No Known Allergies  History   Social History  . Marital Status: Married    Spouse Name: N/A  . Number of Children: N/A  . Years of  Education: N/A   Occupational History  . Retired [employer]   Social History Main Topics  . Smoking status: Former Smoker    Quit date: 10/30/1976  . Smokeless tobacco: Not on file     Comment: Patient would ONLY smoke occasionally   . Alcohol Use: No  . Drug Use: No  . Sexual Activity: Not on file   Other Topics Concern  . Not on file   Social History Narrative   Regular exercise: yes: walking 30 minute once daily     Family History  Problem Relation Age of Onset  . Coronary artery disease Father     MI in 34s  . Cancer Sister     breast  . Coronary artery disease Sister     stent 2009  . Cancer Paternal Aunt     colon, possible breast cancer  . Arthritis      aunts  . Stroke Neg Hx      ROS:  Please see the history of present illness.   Mild arthritis  All other systems reviewed and negative.    Physical Exam:  Blood pressure 100/62, pulse 47, height 5\' 4"  (1.626 m), weight 133 lb 12.8 oz (60.691 kg). General: Well developed, well nourished female in no acute distress. Head: Normocephalic, atraumatic, sclera non-icteric, no xanthomas, nares are  without discharge. EENT: normal Lymph Nodes:  none Back: without scoliosis/kyphosis , no CVA tendersness Neck: Negative for carotid bruits. JVD not elevated. Lungs: Clear bilaterally to auscultation without wheezes, rales, or rhonchi. Breathing is unlabored. Heart: Rapid and irregularly irregular rhythm without murmur , rubs, or gallops appreciated. Abdomen: Soft, non-tender, non-distended with normoactive bowel sounds. No hepatomegaly. No rebound/guarding. No obvious abdominal masses. Msk:  Strength and tone appear normal for age. Extremities: No clubbing or cyanosis. No edema.  Distal pedal pulses are 2+ and equal bilaterally. Skin: Warm and Dry Neuro: Alert and oriented X 3. CN III-XII intact Grossly normal sensory and motor function . Psych:  Responds to questions appropriately with a normal affect.       Labs: Cardiac Enzymes No results for input(s): CKTOTAL, CKMB, TROPONINI in the last 72 hours. CBC Lab Results  Component Value Date   WBC 6.3 02/22/2015   HGB 14.4 02/22/2015   HCT 42.9 02/22/2015   MCV 89.0 02/22/2015   PLT 205.0 02/22/2015   PROTIME: No results for input(s): LABPROT, INR in the last 72 hours. Chemistry No results for input(s): NA, K, CL, CO2, BUN, CREATININE, CALCIUM, PROT, BILITOT, ALKPHOS, ALT, AST, GLUCOSE in the last 168 hours.  Invalid input(s): LABALBU Lipids Lab Results  Component Value Date   CHOL 197 08/22/2011   HDL 59.40 08/22/2011   LDLCALC 121* 08/22/2011   TRIG 82.0 08/22/2011   BNP No results found for: PROBNP Thyroid Function Tests: No results for input(s): TSH, T4TOTAL, T3FREE, THYROIDAB in the last 72 hours.  Invalid input(s): FREET3    Miscellaneous No results found for: DDIMER  Radiology/Studies:  Dg Chest 2 View  02/22/2015   CLINICAL DATA:  Shortness of breath.  EXAM: CHEST  2 VIEW  COMPARISON:  None.  FINDINGS: Mediastinum hilar structures normal. Cardiomegaly. Mild basilar pulmonary interstitial prominence and bilateral small pleural effusions. These findings suggest mild congestive heart failure. Mild compression fractures are noted of a mid and lower thoracic vertebral body, age undetermined. Diffuse osteopenia .  IMPRESSION: 1. Mild congestive heart failure. 2. Mild compression fractures are noted of the mid and lower thoracic vertebral body, age undetermined . Diffuse osteopenia.   Electronically Signed   By: Marcello Moores  Register   On: 02/22/2015 12:26    EKG: Atrial fibrillation at 120 Intervals-/80/31  Outpatient laboratories were reviewed with potassium of 4.6 and creatinine 0.78 and a TSH of 1.0 hemoglobin was normal ECG was reviewed   Assessment and Plan:  Atrial fibrillation with a rapid ventricular response  Congestive heart failure  She is symptomatically atrial fibrillation with a rapid rate manifesting as  dyspnea on exertion with chest x-ray evidence of congestive heart failure.  We have reviewed the physiology and treatment strategies.  Her CHADS-VASc score is 3. We'll begin her on anticoagulation. We discussed the use of the NOACs compared to Coumadin. We briefly reviewed the data of at least comparability in stroke prevention, bleeding and outcome. We discussed some of the new once wherein somewhat associated with decreased ischemic stroke risk, one to be taken daily, and has been shown to be comparable and bleeding risk to aspirin.  We also discussed bleeding associated with warfarin as well as NOACs and a wall bleeding as a complication of all these drugs intracranial bleeding is more frequently associated with warfarin then the NOACs and a GI bleeding is more commonly associated with the latter   We will start her on ELIQUIS. She will check with our pharmacist regarding cost.  We'll begin her on diltiazem for rate control. Recheck of blood pressure was 140 mm. We will have her seen in the A. fib clinic next week for further up titration of rate control  Would anticipate cardioversion 3-4 weeks.  We will undertake an echo in the interim for LV function and only size.  I will have her discontinue her diuretic and 48 hours.     Virl Axe

## 2015-03-01 NOTE — Patient Instructions (Signed)
Medication Instructions:  Your physician has recommended you make the following change in your medication:  1) STOP Lasix on Wednesday 2) START Diltiazem 120 mg daily 3) START Eliquis 5 mg twice daily  Labwork: None ordered  Testing/Procedures: Your physician has requested that you have an echocardiogram. Echocardiography is a painless test that uses sound waves to create images of your heart. It provides your doctor with information about the size and shape of your heart and how well your heart's chambers and valves are working. This procedure takes approximately one hour. There are no restrictions for this procedure.  Follow-Up: Your physician recommends that you schedule a follow-up appointment in: A Fib clinic next week with Roderic Palau, NP (to follow up if further titration for rate control is needed)   Thank you for choosing [facility]!!

## 2015-03-08 ENCOUNTER — Other Ambulatory Visit: Payer: Self-pay

## 2015-03-08 ENCOUNTER — Ambulatory Visit (HOSPITAL_COMMUNITY): Payer: Medicare PPO | Attending: Cardiovascular Disease

## 2015-03-08 DIAGNOSIS — I071 Rheumatic tricuspid insufficiency: Secondary | ICD-10-CM | POA: Diagnosis not present

## 2015-03-08 DIAGNOSIS — I4891 Unspecified atrial fibrillation: Secondary | ICD-10-CM

## 2015-03-08 DIAGNOSIS — E785 Hyperlipidemia, unspecified: Secondary | ICD-10-CM | POA: Diagnosis not present

## 2015-03-08 DIAGNOSIS — Z87891 Personal history of nicotine dependence: Secondary | ICD-10-CM | POA: Insufficient documentation

## 2015-03-10 ENCOUNTER — Encounter (HOSPITAL_COMMUNITY): Payer: Self-pay | Admitting: Nurse Practitioner

## 2015-03-10 ENCOUNTER — Ambulatory Visit (HOSPITAL_COMMUNITY)
Admission: RE | Admit: 2015-03-10 | Discharge: 2015-03-10 | Disposition: A | Discharge status: Home / Self Care | Payer: Medicare PPO | Source: Ambulatory Visit | Attending: Nurse Practitioner | Admitting: Nurse Practitioner

## 2015-03-10 VITALS — BP 110/78 | HR 91 | Ht 64.0 in | Wt 136.6 lb

## 2015-03-10 DIAGNOSIS — I481 Persistent atrial fibrillation: Secondary | ICD-10-CM | POA: Insufficient documentation

## 2015-03-10 DIAGNOSIS — I10 Essential (primary) hypertension: Secondary | ICD-10-CM | POA: Diagnosis not present

## 2015-03-10 DIAGNOSIS — I4891 Unspecified atrial fibrillation: Secondary | ICD-10-CM | POA: Diagnosis not present

## 2015-03-10 DIAGNOSIS — Z87891 Personal history of nicotine dependence: Secondary | ICD-10-CM | POA: Diagnosis not present

## 2015-03-10 NOTE — Patient Instructions (Signed)
Code is [phone]

## 2015-03-10 NOTE — Progress Notes (Signed)
Patient ID: Molly Tucker, female   DOB: 1932-07-07, 81 y.o.   MRN: [id number]     Primary Care Physician: Unice Cobble, MD Referring Physician:   [name] is a 81 y.o. female with a h/o new onset afib, however exact date of onset is unknown.Per pt, may have been going on a while. She c/o dyspnea to her PCP and EKG showed afib with RVR and CXR showed CHF. Chadsvasc score of 3, on apixaban 5 mg bid and tolerating without bleeding. Is on cardizem for rate control and has noticed some LLE since CCB started  and takes an occasional fluid pill.  Overall, she feels improved, states she is very active.Per Dr. Olin Pia note, she is to be set up for DCCV in 3-4 weeks following anticoagulation. The pt however is not very excited about having this done. BP initially elevated prior to CCB but now well controlled at 110/78. Heart rate satisfactory today.  Today, she denies symptoms of palpitations, chest pain, shortness of breath, orthopnea, PND, positve for intermittent lower extremity edema. lower extremity edema, dizziness, presyncope, syncope, or neurologic sequela. The patient is tolerating medications without difficulties and is otherwise without complaint today.   Past Medical History  Diagnosis Date  . Osteoporosis     S/P  biphosphonates x 5 yrs  . Carotid bruit 2005    right, carotid doppler: <39% occlusion  . Hyperplastic colonic polyp 2006    Dr. Carlean Purl, due 2016  . Hyperlipidemia     LDL goal =<120   Past Surgical History  Procedure Laterality Date  . Dilation and curettage of uterus    . Mouth surgery  2009-2010    implants, Dr. Marcelyn Ditty  . Colonoscopy w/ polypectomy  2006    Current Outpatient Prescriptions  Medication Sig Dispense Refill  . apixaban (ELIQUIS) 5 MG TABS tablet Take 1 tablet (5 mg total) by mouth 2 (two) times daily. 60 tablet 0  . Calcium Carbonate-Vitamin D (CALCIUM 600+D) 600-400 MG-UNIT per tablet Take 1 tablet by mouth 2 (two) times daily.      Marland Kitchen  diltiazem (CARDIZEM CD) 120 MG 24 hr capsule Take 1 capsule (120 mg total) by mouth daily. 30 capsule 3  . Multiple Vitamin (MULTIVITAMIN) tablet Take 1 tablet by mouth daily.     No current facility-administered medications for this encounter.    No Known Allergies  History   Social History  . Marital Status: Married    Spouse Name: N/A  . Number of Children: N/A  . Years of Education: N/A   Occupational History  . Retired [employer]   Social History Main Topics  . Smoking status: Former Smoker    Quit date: 10/30/1976  . Smokeless tobacco: Not on file     Comment: Patient would ONLY smoke occasionally   . Alcohol Use: No  . Drug Use: No  . Sexual Activity: Not on file   Other Topics Concern  . Not on file   Social History Narrative   Regular exercise: yes: walking 30 minute once daily    Family History  Problem Relation Age of Onset  . Coronary artery disease Father     MI in 11s  . Cancer Sister     breast  . Coronary artery disease Sister     stent 2009  . Cancer Paternal Aunt     colon, possible breast cancer  . Arthritis      aunts  . Stroke Neg Hx  ROS- All systems are reviewed and negative except as per the HPI above  Physical Exam: Filed Vitals:   03/10/15 1528  BP: 110/78  Pulse: 91  Height: 5\' 4"  (1.626 m)  Weight: 136 lb 9.6 oz (61.961 kg)    GEN- The patient is well appearing, alert and oriented x 3 today.   Head- normocephalic, atraumatic Eyes-  Sclera clear, conjunctiva pink Ears- hearing intact Oropharynx- clear Neck- supple, no JVP Lymph- no cervical lymphadenopathy Lungs- Clear to ausculation bilaterally, normal work of breathing Heart- Irregular rate and rhythm, no murmurs, rubs or gallops, PMI not laterally displaced GI- soft, NT, ND, + BS Extremities- no clubbing, cyanosis, or  trace LLE. MS- no significant deformity or atrophy Skin- no rash or lesion Psych- euthymic mood, full affect Neuro- strength and sensation are  intact  EKG- Afib at 91 bpm.  Assessment and Plan: 1. Persistent afib  Continue cardizem, don't think additional rate control is needed at this time, especially with pt experiencing LLE. May have to consider  change to BB if swelling worsens. Continue Eliquis.  Return in 2 weeks for further evaluation and need for further rate control. At that time can discuss DCCV further with pt and set up for the next week at that time if pt in agreement. Reminded not to miss any doses of DOAC.  2. HTN BP wel managed today.

## 2015-03-19 ENCOUNTER — Other Ambulatory Visit: Payer: Self-pay | Admitting: Internal Medicine

## 2015-03-25 ENCOUNTER — Encounter (HOSPITAL_COMMUNITY): Payer: Self-pay | Admitting: Nurse Practitioner

## 2015-03-25 ENCOUNTER — Other Ambulatory Visit (HOSPITAL_COMMUNITY): Payer: Self-pay | Admitting: *Deleted

## 2015-03-25 ENCOUNTER — Ambulatory Visit (HOSPITAL_COMMUNITY)
Admission: RE | Admit: 2015-03-25 | Discharge: 2015-03-25 | Disposition: A | Discharge status: Home / Self Care | Payer: Medicare PPO | Source: Ambulatory Visit | Attending: Nurse Practitioner | Admitting: Nurse Practitioner

## 2015-03-25 VITALS — BP 130/80 | HR 100 | Ht 64.0 in | Wt 136.8 lb

## 2015-03-25 DIAGNOSIS — I10 Essential (primary) hypertension: Secondary | ICD-10-CM | POA: Diagnosis not present

## 2015-03-25 DIAGNOSIS — I4891 Unspecified atrial fibrillation: Secondary | ICD-10-CM | POA: Diagnosis present

## 2015-03-25 MED ORDER — METOPROLOL SUCCINATE ER 50 MG PO TB24: 50.0000 mg | ORAL_TABLET | Freq: Every day | ORAL | Status: DC

## 2015-03-25 NOTE — Patient Instructions (Signed)
I will call you with cardioversion date and time. Any questions before then call us [phone]  We will set up follow up with [name] once cardioversion scheduled.

## 2015-03-25 NOTE — Progress Notes (Signed)
Patient ID: Molly Tucker, female   DOB: August 15, 1932, 81 y.o.   MRN: [id number]      Primary Care Physician: Unice Cobble, MD Referring Physician: Dr. Jolyn Nap   Molly Tucker is a 81 y.o. female with a h/o new onset afib, however exact date of onset is unknown.Per pt, may have been going on a while. She c/o dyspnea to her PCP and EKG showed afib with RVR and CXR showed CHF. Chadsvasc score of 3, on apixaban 5 mg bid and tolerating without bleeding. Is on cardizem for rate control and has noticed some LLE since CCB started  and takes an occasional fluid pill. Per Dr. Olin Pia note, she is to be set up for DCCV in 3-4 weeks following anticoagulation. The pt has noticed LLE since being on Cardizem and is requesting another rate control drug. She is willing to undergo DCCV but wishes to delay until the beginning of second week of June due to her husbands current state of health.  Today, she denies symptoms of palpitations, chest pain, shortness of breath, orthopnea, PND, positve for intermittent lower extremity edema, no dizziness, presyncope, syncope, or neurologic sequela.   Past Medical History  Diagnosis Date  . Osteoporosis     S/P  biphosphonates x 5 yrs  . Carotid bruit 2005    right, carotid doppler: <39% occlusion  . Hyperplastic colonic polyp 2006    Dr. Carlean Purl, due 2016  . Hyperlipidemia     LDL goal =<120   Past Surgical History  Procedure Laterality Date  . Dilation and curettage of uterus    . Mouth surgery  2009-2010    implants, Dr. Marcelyn Ditty  . Colonoscopy w/ polypectomy  2006    Current Outpatient Prescriptions  Medication Sig Dispense Refill  . apixaban (ELIQUIS) 5 MG TABS tablet Take 1 tablet (5 mg total) by mouth 2 (two) times daily. 60 tablet 0  . Calcium Carbonate-Vitamin D (CALCIUM 600+D) 600-400 MG-UNIT per tablet Take 1 tablet by mouth 2 (two) times daily.      Marland Kitchen diltiazem (CARDIZEM CD) 120 MG 24 hr capsule Take 1 capsule (120 mg total) by mouth daily.  30 capsule 3  . furosemide (LASIX) 40 MG tablet TAKE 1 TABLET (40 MG TOTAL) BY MOUTH DAILY. (Patient taking differently: as needed only) 30 tablet 5  . Multiple Vitamin (MULTIVITAMIN) tablet Take 1 tablet by mouth daily.     No current facility-administered medications for this encounter.    No Known Allergies  History   Social History  . Marital Status: Married    Spouse Name: N/A  . Number of Children: N/A  . Years of Education: N/A   Occupational History  . Retired [employer]   Social History Main Topics  . Smoking status: Former Smoker    Quit date: 10/30/1976  . Smokeless tobacco: Not on file     Comment: Patient would ONLY smoke occasionally   . Alcohol Use: No  . Drug Use: No  . Sexual Activity: Not on file   Other Topics Concern  . Not on file   Social History Narrative   Regular exercise: yes: walking 30 minute once daily    Family History  Problem Relation Age of Onset  . Coronary artery disease Father     MI in 73s  . Cancer Sister     breast  . Coronary artery disease Sister     stent 2009  . Cancer Paternal Aunt     colon, possible  breast cancer  . Arthritis      aunts  . Stroke Neg Hx     ROS- All systems are reviewed and negative except as per the HPI above  Physical Exam: Filed Vitals:   03/25/15 1513  BP: 130/80  Pulse: 100  Height: 5\' 4"  (1.626 m)  Weight: 136 lb 12.8 oz (62.052 kg)    GEN- The patient is well appearing, alert and oriented x 3 today.   Head- normocephalic, atraumatic Eyes-  Sclera clear, conjunctiva pink Ears- hearing intact Oropharynx- clear Neck- supple, no JVP Lymph- no cervical lymphadenopathy Lungs- Clear to ausculation bilaterally, normal work of breathing Heart- Irregular rate and rhythm, no murmurs, rubs or gallops, PMI not laterally displaced GI- soft, NT, ND, + BS Extremities- no clubbing, cyanosis,trace LLE. MS- no significant deformity or atrophy Skin- no rash or lesion Psych- euthymic mood, full  affect Neuro- strength and sensation are intact  EKG- Afib at 91 bpm.  Assessment and Plan: 1. Persistent afib  Change Cardizem to metoprolol succinate 50 mg daily for rate control due to c/o LLE swelling. Continue Eliquis without fail. Will schedule DCCV early June.   2. HTN BP well managed today.  F/u with [name] following cardioversion.

## 2015-03-26 ENCOUNTER — Other Ambulatory Visit (HOSPITAL_COMMUNITY): Payer: Self-pay | Admitting: *Deleted

## 2015-03-26 ENCOUNTER — Encounter (HOSPITAL_COMMUNITY): Payer: Self-pay | Admitting: *Deleted

## 2015-03-26 ENCOUNTER — Encounter (HOSPITAL_COMMUNITY): Payer: Self-pay | Admitting: Pharmacy Technician

## 2015-03-30 ENCOUNTER — Ambulatory Visit (HOSPITAL_COMMUNITY)
Admission: RE | Admit: 2015-03-30 | Discharge: 2015-03-30 | Disposition: A | Discharge status: Home / Self Care | Payer: Medicare PPO | Source: Ambulatory Visit | Attending: Nurse Practitioner | Admitting: Nurse Practitioner

## 2015-03-30 ENCOUNTER — Other Ambulatory Visit: Payer: Self-pay

## 2015-03-30 ENCOUNTER — Other Ambulatory Visit (HOSPITAL_COMMUNITY): Payer: Self-pay | Admitting: *Deleted

## 2015-03-30 DIAGNOSIS — I481 Persistent atrial fibrillation: Secondary | ICD-10-CM | POA: Diagnosis not present

## 2015-03-30 DIAGNOSIS — I4891 Unspecified atrial fibrillation: Secondary | ICD-10-CM | POA: Insufficient documentation

## 2015-03-30 LAB — CBC
HCT: 41.1 % (ref 36.0–46.0)
HEMOGLOBIN: 13.5 g/dL (ref 12.0–15.0)
MCH: 29.7 pg (ref 26.0–34.0)
MCHC: 32.8 g/dL (ref 30.0–36.0)
MCV: 90.3 fL (ref 78.0–100.0)
Platelets: 196 10*3/uL (ref 150–400)
RBC: 4.55 MIL/uL (ref 3.87–5.11)
RDW: 13.9 % (ref 11.5–15.5)
WBC: 5.1 10*3/uL (ref 4.0–10.5)

## 2015-03-30 LAB — BASIC METABOLIC PANEL
Anion gap: 7 (ref 5–15)
BUN: 14 mg/dL (ref 6–20)
CO2: 28 mmol/L (ref 22–32)
CREATININE: 0.82 mg/dL (ref 0.44–1.00)
Calcium: 9.4 mg/dL (ref 8.9–10.3)
Chloride: 106 mmol/L (ref 101–111)
GFR calc Af Amer: >60 mL/min (ref >60)
GFR calc non Af Amer: >60 mL/min (ref >60)
Glucose, Bld: 86 mg/dL (ref 65–99)
Potassium: 4.6 mmol/L (ref 3.5–5.1)
SODIUM: 141 mmol/L (ref 135–145)

## 2015-03-30 MED ORDER — DILTIAZEM HCL ER COATED BEADS 120 MG PO CP24: 120.0000 mg | ORAL_CAPSULE | Freq: Every day | ORAL | Status: DC

## 2015-03-30 MED ORDER — APIXABAN 5 MG PO TABS: 5.0000 mg | ORAL_TABLET | Freq: Two times a day (BID) | ORAL | Status: DC

## 2015-03-31 ENCOUNTER — Encounter (HOSPITAL_COMMUNITY): Payer: Self-pay | Admitting: *Deleted

## 2015-03-31 ENCOUNTER — Ambulatory Visit (HOSPITAL_COMMUNITY)
Admission: RE | Admit: 2015-03-31 | Discharge: 2015-03-31 | Disposition: A | Discharge status: Home / Self Care | Payer: Medicare PPO | Source: Ambulatory Visit | Attending: Cardiovascular Disease | Admitting: Cardiovascular Disease

## 2015-03-31 ENCOUNTER — Encounter (HOSPITAL_COMMUNITY)
Admission: RE | Disposition: A | Discharge status: Home / Self Care | Payer: Self-pay | Source: Ambulatory Visit | Attending: Cardiovascular Disease

## 2015-03-31 ENCOUNTER — Ambulatory Visit (HOSPITAL_COMMUNITY): Payer: Medicare PPO | Admitting: Anesthesiology

## 2015-03-31 DIAGNOSIS — Z79899 Other long term (current) drug therapy: Secondary | ICD-10-CM | POA: Insufficient documentation

## 2015-03-31 DIAGNOSIS — I10 Essential (primary) hypertension: Secondary | ICD-10-CM | POA: Diagnosis not present

## 2015-03-31 DIAGNOSIS — Z87891 Personal history of nicotine dependence: Secondary | ICD-10-CM | POA: Diagnosis not present

## 2015-03-31 DIAGNOSIS — I4891 Unspecified atrial fibrillation: Secondary | ICD-10-CM | POA: Diagnosis present

## 2015-03-31 DIAGNOSIS — M810 Age-related osteoporosis without current pathological fracture: Secondary | ICD-10-CM | POA: Insufficient documentation

## 2015-03-31 DIAGNOSIS — E785 Hyperlipidemia, unspecified: Secondary | ICD-10-CM | POA: Diagnosis not present

## 2015-03-31 DIAGNOSIS — I481 Persistent atrial fibrillation: Secondary | ICD-10-CM | POA: Diagnosis not present

## 2015-03-31 DIAGNOSIS — I4819 Other persistent atrial fibrillation: Secondary | ICD-10-CM | POA: Insufficient documentation

## 2015-03-31 HISTORY — PX: CARDIOVERSION: SHX1299

## 2015-03-31 SURGERY — CARDIOVERSION
Anesthesia: General

## 2015-03-31 MED ORDER — PROPOFOL 10 MG/ML IV BOLUS
INTRAVENOUS | Status: DC | PRN
Start: 2015-03-31 — End: 2015-03-31
  Administered 2015-03-31: 70 mg via INTRAVENOUS

## 2015-03-31 MED ORDER — LIDOCAINE HCL (CARDIAC) 20 MG/ML IV SOLN
INTRAVENOUS | Status: DC | PRN
  Administered 2015-03-31: 60 mg via INTRAVENOUS

## 2015-03-31 MED ORDER — SODIUM CHLORIDE 0.9 % IV SOLN
INTRAVENOUS | Status: DC
  Administered 2015-03-31: 11:00:00 via INTRAVENOUS

## 2015-03-31 NOTE — H&P (View-Only) (Signed)
Patient ID: Molly Tucker, female   DOB: 07-23-1932, 81 y.o.   MRN: [id number]      Primary Care Physician: Unice Cobble, MD Referring Physician: Dr. Jolyn Nap   Molly Tucker is a 81 y.o. female with a h/o new onset afib, however exact date of onset is unknown.Per pt, may have been going on a while. She c/o dyspnea to her PCP and EKG showed afib with RVR and CXR showed CHF. Chadsvasc score of 3, on apixaban 5 mg bid and tolerating without bleeding. Is on cardizem for rate control and has noticed some LLE since CCB started  and takes an occasional fluid pill. Per Dr. Olin Pia note, she is to be set up for DCCV in 3-4 weeks following anticoagulation. The pt has noticed LLE since being on Cardizem and is requesting another rate control drug. She is willing to undergo DCCV but wishes to delay until the beginning of second week of June due to her husbands current state of health.  Today, she denies symptoms of palpitations, chest pain, shortness of breath, orthopnea, PND, positve for intermittent lower extremity edema, no dizziness, presyncope, syncope, or neurologic sequela.   Past Medical History  Diagnosis Date  . Osteoporosis     S/P  biphosphonates x 5 yrs  . Carotid bruit 2005    right, carotid doppler: <39% occlusion  . Hyperplastic colonic polyp 2006    Dr. Carlean Purl, due 2016  . Hyperlipidemia     LDL goal =<120   Past Surgical History  Procedure Laterality Date  . Dilation and curettage of uterus    . Mouth surgery  2009-2010    implants, Dr. Marcelyn Ditty  . Colonoscopy w/ polypectomy  2006    Current Outpatient Prescriptions  Medication Sig Dispense Refill  . apixaban (ELIQUIS) 5 MG TABS tablet Take 1 tablet (5 mg total) by mouth 2 (two) times daily. 60 tablet 0  . Calcium Carbonate-Vitamin D (CALCIUM 600+D) 600-400 MG-UNIT per tablet Take 1 tablet by mouth 2 (two) times daily.      Marland Kitchen diltiazem (CARDIZEM CD) 120 MG 24 hr capsule Take 1 capsule (120 mg total) by mouth daily.  30 capsule 3  . furosemide (LASIX) 40 MG tablet TAKE 1 TABLET (40 MG TOTAL) BY MOUTH DAILY. (Patient taking differently: as needed only) 30 tablet 5  . Multiple Vitamin (MULTIVITAMIN) tablet Take 1 tablet by mouth daily.     No current facility-administered medications for this encounter.    No Known Allergies  History   Social History  . Marital Status: Married    Spouse Name: N/A  . Number of Children: N/A  . Years of Education: N/A   Occupational History  . Retired [employer]   Social History Main Topics  . Smoking status: Former Smoker    Quit date: 10/30/1976  . Smokeless tobacco: Not on file     Comment: Patient would ONLY smoke occasionally   . Alcohol Use: No  . Drug Use: No  . Sexual Activity: Not on file   Other Topics Concern  . Not on file   Social History Narrative   Regular exercise: yes: walking 30 minute once daily    Family History  Problem Relation Age of Onset  . Coronary artery disease Father     MI in 75s  . Cancer Sister     breast  . Coronary artery disease Sister     stent 2009  . Cancer Paternal Aunt     colon, possible  breast cancer  . Arthritis      aunts  . Stroke Neg Hx     ROS- All systems are reviewed and negative except as per the HPI above  Physical Exam: Filed Vitals:   03/25/15 1513  BP: 130/80  Pulse: 100  Height: 5\' 4"  (1.626 m)  Weight: 136 lb 12.8 oz (62.052 kg)    GEN- The patient is well appearing, alert and oriented x 3 today.   Head- normocephalic, atraumatic Eyes-  Sclera clear, conjunctiva pink Ears- hearing intact Oropharynx- clear Neck- supple, no JVP Lymph- no cervical lymphadenopathy Lungs- Clear to ausculation bilaterally, normal work of breathing Heart- Irregular rate and rhythm, no murmurs, rubs or gallops, PMI not laterally displaced GI- soft, NT, ND, + BS Extremities- no clubbing, cyanosis,trace LLE. MS- no significant deformity or atrophy Skin- no rash or lesion Psych- euthymic mood, full  affect Neuro- strength and sensation are intact  EKG- Afib at 91 bpm.  Assessment and Plan: 1. Persistent afib  Change Cardizem to metoprolol succinate 50 mg daily for rate control due to c/o LLE swelling. Continue Eliquis without fail. Will schedule DCCV early June.   2. HTN BP well managed today.  F/u with [name] following cardioversion.

## 2015-03-31 NOTE — Discharge Instructions (Signed)

## 2015-03-31 NOTE — Op Note (Signed)
Procedure: Electrical Cardioversion Indications:  Atrial Fibrillation  Procedure Details:  Consent: Risks of procedure as well as the alternatives and risks of each were explained to the (patient/caregiver).  Consent for procedure obtained.  Time Out: Verified patient identification, verified procedure, site/side was marked, verified correct patient position, special equipment/implants available, medications/allergies/relevent history reviewed, required imaging and test results available.  Performed  Patient placed on cardiac monitor, pulse oximetry, supplemental oxygen as necessary.  Sedation given: propofol IV, Dr. Rodman Comp Pacer pads placed anterior and posterior chest.  Cardioverted 1 time(s).  Cardioversion with synchronized biphasic 120J shock.  Evaluation: Findings: Post procedure EKG shows: atrial tachycardia, spontaneously converted to NSR after 6-64 seconds Complications: None Patient did tolerate procedure well.  Time Spent Directly with the Patient:  60 minutes   Molly Tucker 03/31/2015, 12:12 PM

## 2015-03-31 NOTE — Anesthesia Postprocedure Evaluation (Signed)
  Anesthesia Post-op Note  Patient: Molly Tucker  Procedure(s) Performed: Procedure(s): CARDIOVERSION (N/A)  Patient Location: PACU  Anesthesia Type:General  Level of Consciousness: awake and alert   Airway and Oxygen Therapy: Patient Spontanous Breathing  Post-op Pain: none  Post-op Assessment: Post-op Vital signs reviewed  Post-op Vital Signs: Reviewed  Last Vitals:  Filed Vitals:   03/31/15 1209  BP:   Pulse: 123  Temp: 36.7 C  Resp: 13    Complications: No apparent anesthesia complications

## 2015-03-31 NOTE — Anesthesia Preprocedure Evaluation (Addendum)
Anesthesia Evaluation  Patient identified by MRN, date of birth, ID band Patient awake    Reviewed: Allergy & Precautions, NPO status , Patient's Chart, lab work & pertinent test results, reviewed documented beta blocker date and time   Airway Mallampati: III  TM Distance: >3 FB Neck ROM: Full    Dental   Pulmonary former smoker,  breath sounds clear to auscultation        Cardiovascular hypertension, Pt. on home beta blockers and Pt. on medications + dysrhythmias Atrial Fibrillation Rhythm:Regular Rate:Normal  02/2015 Echo: EF 55-60%   Neuro/Psych negative neurological ROS     GI/Hepatic Neg liver ROS,   Endo/Other  negative endocrine ROS  Renal/GU negative Renal ROS     Musculoskeletal   Abdominal   Peds  Hematology negative hematology ROS (+)   Anesthesia Other Findings   Reproductive/Obstetrics                            Anesthesia Physical Anesthesia Plan  ASA: II  Anesthesia Plan: General   Post-op Pain Management:    Induction: Intravenous  Airway Management Planned: Mask and Natural Airway  Additional Equipment:   Intra-op Plan:   Post-operative Plan:   Informed Consent: I have reviewed the patients History and Physical, chart, labs and discussed the procedure including the risks, benefits and alternatives for the proposed anesthesia with the patient or authorized representative who has indicated his/her understanding and acceptance.     Plan Discussed with: CRNA  Anesthesia Plan Comments:         Anesthesia Quick Evaluation

## 2015-03-31 NOTE — Interval H&P Note (Signed)
History and Physical Interval Note:  03/31/2015 8:20 AM  Molly Tucker  has presented today for surgery, with the diagnosis of AFIB  The various methods of treatment have been discussed with the patient and family. After consideration of risks, benefits and other options for treatment, the patient has consented to  Procedure(s): CARDIOVERSION (N/A) as a surgical intervention .  The patient's history has been reviewed, patient examined, no change in status, stable for surgery.  I have reviewed the patient's chart and labs.  Questions were answered to the patient's satisfaction.     [name]

## 2015-03-31 NOTE — Transfer of Care (Signed)
Immediate Anesthesia Transfer of Care Note  Patient: Molly Tucker  Procedure(s) Performed: Procedure(s): CARDIOVERSION (N/A)  Patient Location: PACU  Anesthesia Type:MAC  Level of Consciousness: awake, alert  and oriented  Airway & Oxygen Therapy: Patient Spontanous Breathing and Patient connected to nasal cannula oxygen  Post-op Assessment: Report given to RN and Post -op Vital signs reviewed and stable  Post vital signs: Reviewed and stable  Last Vitals:  Filed Vitals:   03/31/15 1114  BP: 145/79  Pulse: 109  Temp: 36.6 C  Resp: 22    Complications: No apparent anesthesia complications

## 2015-04-01 ENCOUNTER — Encounter (HOSPITAL_COMMUNITY): Payer: Self-pay | Admitting: Cardiovascular Disease

## 2015-04-11 ENCOUNTER — Encounter: Payer: Self-pay | Admitting: Nurse Practitioner

## 2015-04-11 NOTE — Progress Notes (Signed)
Electrophysiology Office Note Date: 04/14/2015  ID:  Molly Tucker, DOB 04/26/1932, MRN [id number]  PCP: Unice Cobble, MD Electrophysiologist: Caryl Comes  CC: Follow up post cardioversion  Molly Tucker is a 81 y.o. female seen today for Dr Caryl Comes.  She was recently found to have new onset atrial fibrillation and was referred for further evaluation. She was started on Diltiazem which was changed to Metoprolol 2/2 LE edema.  She underwent cardioversion 03/31/15 and presents today for follow-up.  Since cardioversion, the patient reports doing very well.  She is symptomatically improved in SR.  She denies chest pain, palpitations, dyspnea, PND, orthopnea, nausea, vomiting, dizziness, syncope, edema, weight gain, or early satiety.  Echocardiogram 02/2015 demonstrated EF 55-60%, no RWMA, moderate TR, LA 41.   Past Medical History  Diagnosis Date  . Osteoporosis     S/P  biphosphonates x 5 yrs  . Carotid bruit 2005    right, carotid doppler: <39% occlusion  . Hyperplastic colonic polyp 2006    Dr. Carlean Purl, due 2016  . Hyperlipidemia     LDL goal =<120  . Hypertension   . Persistent atrial fibrillation   . Atrial tachycardia    Past Surgical History  Procedure Laterality Date  . Dilation and curettage of uterus    . Mouth surgery  2009-2010    implants, Dr. Marcelyn Ditty  . Colonoscopy w/ polypectomy  2006  . Cardioversion N/A 03/31/2015    Procedure: CARDIOVERSION;  Surgeon: Sanda Klein, MD;  Location: [facility];  Service: Cardiovascular;  Laterality: N/A;    Current Outpatient Prescriptions  Medication Sig Dispense Refill  . apixaban (ELIQUIS) 5 MG TABS tablet Take 1 tablet (5 mg total) by mouth 2 (two) times daily. 180 tablet 1  . Calcium Carbonate-Vitamin D (CALCIUM 600+D) 600-400 MG-UNIT per tablet Take 1 tablet by mouth 2 (two) times daily.     . metoprolol succinate (TOPROL XL) 50 MG 24 hr tablet Take 1 tablet (50 mg total) by mouth daily. Take with or immediately following  a meal. 30 tablet 1  . Multiple Vitamin (MULTIVITAMIN) tablet Take 1 tablet by mouth daily.     No current facility-administered medications for this visit.    Allergies:   Review of patient's allergies indicates no known allergies.   Social History: History   Social History  . Marital Status: Married    Spouse Name: N/A  . Number of Children: N/A  . Years of Education: N/A   Occupational History  . Retired [employer]   Social History Main Topics  . Smoking status: Former Smoker    Quit date: 10/30/1976  . Smokeless tobacco: Not on file     Comment: Patient would ONLY smoke occasionally   . Alcohol Use: No  . Drug Use: No  . Sexual Activity: Not on file   Other Topics Concern  . Not on file   Social History Narrative   Regular exercise: yes: walking 30 minute once daily    Family History: Family History  Problem Relation Age of Onset  . Coronary artery disease Father     MI in 56s  . Cancer Sister     breast  . Coronary artery disease Sister     stent 2009  . Cancer Paternal Aunt     colon, possible breast cancer  . Arthritis      aunts  . Stroke Neg Hx     Review of Systems: All other systems reviewed and are otherwise negative except  as noted above.   Physical Exam: VS:  BP 118/58 mmHg  Pulse 70  Ht 5\' 4"  (1.626 m)  Wt 133 lb (60.328 kg)  BMI 22.82 kg/m2 , BMI Body mass index is 22.82 kg/(m^2). Wt Readings from Last 3 Encounters:  04/14/15 133 lb (60.328 kg)  03/25/15 136 lb 12.8 oz (62.052 kg)  03/10/15 136 lb 9.6 oz (61.961 kg)    GEN- The patient is elderly appearing, alert and oriented x 3 today.   HEENT: normocephalic, atraumatic; sclera clear, conjunctiva pink; hearing intact; oropharynx clear; neck supple, no JVP Lymph- no cervical lymphadenopathy Lungs- Clear to ausculation bilaterally, normal work of breathing.  No wheezes, rales, rhonchi Heart- Irregular rate and rhythm, no murmurs, rubs or gallops  GI- soft, non-tender,  non-distended, bowel sounds present Extremities- no clubbing, cyanosis, or edema; DP/PT/radial pulses 2+ bilaterally MS- no significant deformity or atrophy Skin- warm and dry, no rash or lesion  Psych- euthymic mood, full affect Neuro- strength and sensation are intact   EKG:  EKG is ordered today. The ekg ordered today shows sinus rhythm with bigeminal PAC's, rate 70, normal intervals  Recent Labs: 02/22/2015: Magnesium 2.2; TSH 1.02 03/30/2015: BUN 14; Creatinine, Ser 0.82; Hemoglobin 13.5; Platelets 196; Potassium 4.6; Sodium 141    Other studies Reviewed: Additional studies/ records that were reviewed today include: Dr Olin Pia notes, echocardiogram  Assessment and Plan: 1.  Persistent atrial fibrillation Maintaining SR post cardioversion with improvement in symptoms Continue Eliquis for CHADS2VASC of at least 4 Continue Metoprolol with frequent PAC's If recurrent AF, may need to consider AAD therapy Pt is aware to call back with recurrence of AF  2.  HTN Stable No change required today   Current medicines are reviewed at length with the patient today.   The patient does not have concerns regarding her medicines.  The following changes were made today:  none  Labs/ tests ordered today include: none   Disposition:   Follow up with Dr Caryl Comes in 6 months   Signed, Chanetta Marshall, NP 04/14/2015 2:19 PM   [facility] [address] [place] [address] [phone] (office) [phone] (fax)

## 2015-04-14 ENCOUNTER — Ambulatory Visit (INDEPENDENT_AMBULATORY_CARE_PROVIDER_SITE_OTHER): Payer: Medicare PPO | Admitting: Nurse Practitioner

## 2015-04-14 ENCOUNTER — Encounter: Payer: Self-pay | Admitting: Nurse Practitioner

## 2015-04-14 VITALS — BP 118/58 | HR 70 | Ht 64.0 in | Wt 133.0 lb

## 2015-04-14 DIAGNOSIS — I481 Persistent atrial fibrillation: Secondary | ICD-10-CM | POA: Diagnosis not present

## 2015-04-14 DIAGNOSIS — I4819 Other persistent atrial fibrillation: Secondary | ICD-10-CM

## 2015-04-14 NOTE — Patient Instructions (Signed)
Your physician recommends that you continue on your current medications as directed. Please refer to the Current Medication list given to you today.  Your physician wants you to follow-up in: 6 months with Dr. Klein. You will receive a reminder letter in the mail two months in advance. If you don't receive a letter, please call our office to schedule the follow-up appointment.  

## 2015-04-26 ENCOUNTER — Other Ambulatory Visit: Payer: Self-pay

## 2015-05-04 ENCOUNTER — Encounter: Payer: Self-pay | Admitting: Internal Medicine

## 2015-05-05 MED ORDER — METOPROLOL SUCCINATE ER 50 MG PO TB24: 50.0000 mg | ORAL_TABLET | Freq: Every day | ORAL | Status: DC

## 2015-05-05 NOTE — Telephone Encounter (Signed)
Prescription sent per pt request.

## 2015-09-20 ENCOUNTER — Other Ambulatory Visit: Payer: Self-pay | Admitting: Internal Medicine

## 2015-09-20 MED ORDER — APIXABAN 5 MG PO TABS: 5.0000 mg | ORAL_TABLET | Freq: Two times a day (BID) | ORAL | Status: DC

## 2015-09-28 ENCOUNTER — Encounter: Payer: Self-pay | Admitting: Internal Medicine

## 2015-09-29 ENCOUNTER — Encounter: Payer: Self-pay | Admitting: Internal Medicine

## 2015-10-07 ENCOUNTER — Other Ambulatory Visit: Payer: Self-pay | Admitting: *Deleted

## 2015-10-07 MED ORDER — APIXABAN 5 MG PO TABS: 5.0000 mg | ORAL_TABLET | Freq: Two times a day (BID) | ORAL | Status: DC

## 2015-10-08 ENCOUNTER — Other Ambulatory Visit: Payer: Self-pay | Admitting: *Deleted

## 2015-10-08 MED ORDER — APIXABAN 5 MG PO TABS: 5.0000 mg | ORAL_TABLET | Freq: Two times a day (BID) | ORAL | Status: DC

## 2015-10-11 ENCOUNTER — Telehealth: Payer: Self-pay | Admitting: Internal Medicine

## 2015-10-11 NOTE — Telephone Encounter (Signed)
Left message for patient and advised that we can continue to check for eliquis samples to come into the office as unfortunately aspirin does not work in the same way as eliquis.  I advised in message for patient to call back Wednesday to see if samples have arrived.

## 2015-10-11 NOTE — Telephone Encounter (Signed)
New message      Patient calling the office for samples of medication:   1.  What medication and dosage are you requesting samples for?  eliquis 5mg   2.  Are you currently out of this medication? Pt will be out of rx on tomorrow.  It is 212.00 for 2 weeks of medication.  She cannot afford it.  Can she take an aspirin if she runs out of eliquis and we do not have samples?  Her mail order presc is coming. (Message sent to refill for samples and to the nurse to answer aspirin question)

## 2015-10-12 NOTE — Telephone Encounter (Signed)
Patient aware samples placed at the front desk. 

## 2015-10-15 ENCOUNTER — Telehealth: Payer: Self-pay

## 2015-10-15 NOTE — Telephone Encounter (Signed)
Made apt on 12/20 for flu shot

## 2015-10-19 ENCOUNTER — Ambulatory Visit (INDEPENDENT_AMBULATORY_CARE_PROVIDER_SITE_OTHER): Payer: Medicare PPO

## 2015-10-19 DIAGNOSIS — Z23 Encounter for immunization: Secondary | ICD-10-CM

## 2015-11-12 ENCOUNTER — Telehealth: Payer: Self-pay | Admitting: Cardiology

## 2015-11-12 NOTE — Telephone Encounter (Signed)
PT RTN CALL TO MELINDA [phone]

## 2015-11-12 NOTE — Telephone Encounter (Signed)
Spoke with her regarding her husbands labs

## 2015-11-15 ENCOUNTER — Other Ambulatory Visit: Payer: Self-pay | Admitting: Internal Medicine

## 2015-11-16 ENCOUNTER — Other Ambulatory Visit: Payer: Self-pay | Admitting: *Deleted

## 2015-11-16 ENCOUNTER — Encounter: Payer: Self-pay | Admitting: Internal Medicine

## 2015-11-16 MED ORDER — METOPROLOL SUCCINATE ER 50 MG PO TB24: 50.0000 mg | ORAL_TABLET | Freq: Every day | ORAL | Status: DC

## 2015-11-26 ENCOUNTER — Ambulatory Visit (INDEPENDENT_AMBULATORY_CARE_PROVIDER_SITE_OTHER): Payer: Medicare PPO | Admitting: Internal Medicine

## 2015-11-26 ENCOUNTER — Encounter: Payer: Self-pay | Admitting: Internal Medicine

## 2015-11-26 VITALS — BP 110/78 | HR 66 | Ht 64.0 in | Wt 135.0 lb

## 2015-11-26 DIAGNOSIS — I481 Persistent atrial fibrillation: Secondary | ICD-10-CM

## 2015-11-26 DIAGNOSIS — I4819 Other persistent atrial fibrillation: Secondary | ICD-10-CM

## 2015-11-26 DIAGNOSIS — I509 Heart failure, unspecified: Secondary | ICD-10-CM | POA: Diagnosis not present

## 2015-11-26 NOTE — Progress Notes (Signed)
      Patient Care Team: Hendricks Limes, MD as PCP - General   HPI  Molly Tucker is a 82 y.o. female Seen in follow-up for atrial fibrillation with a rapid rate. She underwent cardioversion 6/16.  She has been holding sinus rhythm and has felt great.  Ejection fraction 5/16 was normal mild left atrial enlargement.  Records and Results Reviewed   Past Medical History  Diagnosis Date  . Osteoporosis     S/P  biphosphonates x 5 yrs  . Carotid bruit 2005    right, carotid doppler: <39% occlusion  . Hyperplastic colonic polyp 2006    Dr. Carlean Purl, due 2016  . Hyperlipidemia     LDL goal =<120  . Hypertension   . Persistent atrial fibrillation ([place])   . Atrial tachycardia [facility])     Past Surgical History  Procedure Laterality Date  . Dilation and curettage of uterus    . Mouth surgery  2009-2010    implants, Dr. Marcelyn Ditty  . Colonoscopy w/ polypectomy  2006  . Cardioversion N/A 03/31/2015    Procedure: CARDIOVERSION;  Surgeon: Sanda Klein, MD;  Location: [facility];  Service: Cardiovascular;  Laterality: N/A;    Current Outpatient Prescriptions  Medication Sig Dispense Refill  . apixaban (ELIQUIS) 5 MG TABS tablet Take 1 tablet (5 mg total) by mouth 2 (two) times daily. 28 tablet 0  . Calcium Carbonate-Vitamin D (CALCIUM 600+D) 600-400 MG-UNIT per tablet Take 1 tablet by mouth 2 (two) times daily.     . metoprolol succinate (TOPROL XL) 50 MG 24 hr tablet Take 1 tablet (50 mg total) by mouth daily. Take with or immediately following a meal. 90 tablet 3  . Multiple Vitamin (MULTIVITAMIN) tablet Take 1 tablet by mouth daily.     No current facility-administered medications for this visit.    No Known Allergies    Review of Systems negative except from HPI and PMH  Physical Exam BP 110/78 mmHg  Pulse 66  Ht 5\' 4"  (1.626 m)  Wt 135 lb (61.236 kg)  BMI 23.16 kg/m2 Well developed and well nourished in no acute distress HENT normal E scleral and icterus  clear Neck Supple JVP flat; carotids brisk and full Clear to ausculation  regular rate and rhythm, no murmurs +S4Soft with active bowel sounds No clubbing cyanosis Trace Edema Alert and oriented, grossly normal motor and sensory function Skin Warm and Dry  ECG demonstrates sinus rhythm at 67 Intervals 19/07/40 PACs-frequent  Assessment and  Plan  Atrial fibrillation-persistent holding sinus rhythm  Anticoagulation with ELIQUIS  There no bleeding issues. Her weight is 61 kg we will decrease her ELIQUIS dose from 5--2.5.  I kept awaiting a long time today. She was wondering if she can follow along with Kathrynn Humble who sees her husband; I think that that would be just great. We will set that up for 6 months from now

## 2015-11-26 NOTE — Addendum Note (Signed)
Addended by: Alvis Lemmings C on: 11/26/2015 05:30 PM   Modules accepted: Orders, Medications

## 2015-11-26 NOTE — Patient Instructions (Signed)
Medication Instructions: 1) Finish your current supply of Eliquis 5 mg twice daily, then decrease to Eliquis 2.5 mg by mouth twice daily  ** just call or MyChart message [place] when your current dose of Eliquis is almost done and we will refill the new dose for you.   Labwork: - none  Procedures/Testing: - none  Follow-Up: - Your physician wants you to follow-up in: 6 months with [name], NP. You will receive a reminder letter in the mail two months in advance. If you don't receive a letter, please call our office to schedule the follow-up appointment.  Any Additional Special Instructions Will Be Listed Below (If Applicable).     If you need a refill on your cardiac medications before your next appointment, please call your pharmacy.

## 2015-12-08 ENCOUNTER — Telehealth: Payer: Self-pay

## 2015-12-08 ENCOUNTER — Encounter: Payer: Self-pay | Admitting: Internal Medicine

## 2015-12-08 NOTE — Telephone Encounter (Signed)
Message     I think my A-Fib is back - out of breath with the least exertion. Started about a week ago.  Is there a medication that I can try? I am now taking 50 mg metoprolol daily. My b.p. is 106/74 with 108 heart rate which I just took at 1:00 pm.        Thanks    Dayanne Elfering    [phone]    Received above MyChart message and called patient.  Patient st right after she saw [name], she was working in the yard and noticed she was becoming SOB. She c/o SOB on exertion and lethargy for 1 week. She st she feels the same way she did when she was first diagnosed with a-fib. She denies CP, palpitations.  Patient wants to see if there are any medication changes that can be made for her comfort. Scheduled patient with Tommye Standard, PA for evaluation on Friday, 2/10

## 2015-12-09 ENCOUNTER — Ambulatory Visit: Payer: Medicare PPO | Admitting: Physician Assistant

## 2015-12-09 NOTE — Progress Notes (Addendum)
Cardiology Office Note Date:  12/10/2015  Patient ID:  Molly Tucker, Molly Tucker September 24, 1932, MRN [id number] PCP:  Unice Cobble, MD  Electrophysiologist:  [name]   Chief Complaint: suspects back in AF  History of Present Illness: Molly Tucker is a 82 y.o. female with history of PAFib, HTN, HLD, and osteoporosis, comes to the office today for seen [name] feeling like she is back in Afib.  She denies any kind of CP, no rest or nighttime SOB, but her exertional capacity is less and more fatigued, no energy.  She says now that she has been back in SR for a while and can tell she feels so much better when in normal rhythm.  No dizziness, near syncope or syncope.  She feels her heart beat irregular, or an awareness of her heart beat, not racing does not feel it is fast, but feels it beating differently.  She was lasts seen By [name] 11/26/15, doing well at that time with recurrent symptoms since her DCCV.  PAF Hx: Newly diagnosed April 2016 Originally treated with Diltiazem changed to Metoprolol secondary to edema DCCV 03/31/15   Past Medical History  Diagnosis Date  . Osteoporosis     S/P  biphosphonates x 5 yrs  . Carotid bruit 2005    right, carotid doppler: <39% occlusion  . Hyperplastic colonic polyp 2006    Dr. Carlean Purl, due 2016  . Hyperlipidemia     LDL goal =<120  . Hypertension   . Persistent atrial fibrillation ([place])   . Atrial tachycardia [facility])     Past Surgical History  Procedure Laterality Date  . Dilation and curettage of uterus    . Mouth surgery  2009-2010    implants, Dr. Marcelyn Ditty  . Colonoscopy w/ polypectomy  2006  . Cardioversion N/A 03/31/2015    Procedure: CARDIOVERSION;  Surgeon: Sanda Klein, MD;  Location: [facility];  Service: Cardiovascular;  Laterality: N/A;    Current Outpatient Prescriptions  Medication Sig Dispense Refill  . apixaban (ELIQUIS) 5 MG TABS tablet Take 1 tablet (5 mg total) by mouth 2 (two) times daily. 60 tablet 11  .  Calcium Carbonate-Vitamin D (CALCIUM 600+D) 600-400 MG-UNIT per tablet Take 1 tablet by mouth 2 (two) times daily.     . metoprolol succinate (TOPROL XL) 50 MG 24 hr tablet Take 1 tablet (50 mg total) by mouth daily. Take with or immediately following a meal. 90 tablet 3  . Multiple Vitamin (MULTIVITAMIN) tablet Take 1 tablet by mouth daily.     No current facility-administered medications for this visit.    Allergies:   Review of patient's allergies indicates no known allergies.   Social History:  The patient  reports that she quit smoking about 39 years ago. She does not have any smokeless tobacco history on file. She reports that she does not drink alcohol or use illicit drugs.   Family History:  The patient's family history includes Cancer in her paternal aunt and sister; Coronary artery disease in her father and sister. There is no history of Stroke.  ROS:  Please see the history of present illness.    All other systems are reviewed and otherwise negative.   PHYSICAL EXAM:  VS:  BP 118/78 mmHg  Pulse 107  Ht 5\' 4"  (1.626 m)  Wt 140 lb 4 oz (63.617 kg)  BMI 24.06 kg/m2 BMI: Body mass index is 24.06 kg/(m^2). Well nourished, well developed, in no acute distress HEENT: normocephalic, atraumatic Neck: no  JVD, carotid bruits or masses Cardiac:  Irregular RR; no significant murmurs, no rubs, or gallops Lungs:  clear to auscultation bilaterally, no wheezing, rhonchi or rales Abd: soft, nontender MS: no deformity age appropriate atrophy Ext: no edema Skin: warm and dry, no rash Neuro:  No gross deficits appreciated Psych: euthymic mood, full affect   EKG:  Done today shows Afib 107bpm  03/08/15: Echocardiogram Study Conclusions - Left ventricle: The cavity size was normal. Wall thickness was normal. Systolic function was normal. The estimated ejection fraction was in the range of 55% to 60%. Wall motion was normal; there were no regional wall motion abnormalities. - Right  ventricle: The cavity size was mildly dilated. - Right atrium: The atrium was mildly dilated. - Atrial septum: No defect or patent foramen ovale was identified. - Tricuspid valve: There was moderate regurgitation. LA 19mm  Recent Labs: 02/22/2015: Magnesium 2.2; TSH 1.02 03/30/2015: BUN 14; Creatinine, Ser 0.82; Hemoglobin 13.5; Platelets 196; Potassium 4.6; Sodium 141  No results found for requested labs within last 365 days.   CrCl cannot be calculated (Patient has no serum creatinine result on file.).   Wt Readings from Last 3 Encounters:  12/10/15 140 lb 4 oz (63.617 kg)  11/26/15 135 lb (61.236 kg)  04/14/15 133 lb (60.328 kg)     Other studies reviewed: Additional studies/records reviewed today include: summarized above  ASSESSMENT AND PLAN:  1.   PAFib       CHADS2Vasc is 4 on low dose Eliquis (decreased at her last visit with age and weight)       Her weight today is 63kg rewieghed at 139lbs.  She says she tends to be 135-145 over the years, ususally about 135-138lbs.  We discussed at length today dosing of Eliquis with borderline weight at times, risk of bleeding/clotting.  She never decreased the dose, and has never had any bleeding issues on the 5mg  BID dose, and feels her comfort level is with the 5mg  BID dose with stroke/clot prevention.  She is counseled on bleeding/signs of bleeding.         Discussed rhythm control strategies, she clearly feels better in SR, and is agreeable to repeat DCCV.         She has been chronically on Eliquis without missing doses  2. HTN     Appears well controlled  Disposition: F/u with DCCV when she is ready to schedule, she needs to discuss with her sister first for a date that works for them both, maybe in the next week or so, we will see her back in a month, +/- 2weeks post DCCV.  She needs prefers to do her labs next week, rather then ave them drawn today.  She will come in Monday nest week to get her BMET/CBC done.  We will increase her  Eliquis back to 5mg  BID.  Current medicines are reviewed at length with the patient today.  The patient did not have any concerns regarding medicines.  Haywood Lasso, PA-C 12/10/2015 10:15 AM     [facility] [address] [place] [address] [phone] (office)  [phone] (fax)

## 2015-12-10 ENCOUNTER — Encounter: Payer: Self-pay | Admitting: Physician Assistant

## 2015-12-10 ENCOUNTER — Ambulatory Visit (INDEPENDENT_AMBULATORY_CARE_PROVIDER_SITE_OTHER): Payer: Medicare PPO | Admitting: Physician Assistant

## 2015-12-10 ENCOUNTER — Telehealth: Payer: Self-pay | Admitting: Internal Medicine

## 2015-12-10 VITALS — BP 118/78 | HR 107 | Ht 64.0 in | Wt 140.2 lb

## 2015-12-10 DIAGNOSIS — I10 Essential (primary) hypertension: Secondary | ICD-10-CM | POA: Diagnosis not present

## 2015-12-10 DIAGNOSIS — I4819 Other persistent atrial fibrillation: Secondary | ICD-10-CM

## 2015-12-10 DIAGNOSIS — I481 Persistent atrial fibrillation: Secondary | ICD-10-CM | POA: Diagnosis not present

## 2015-12-10 MED ORDER — APIXABAN 5 MG PO TABS: 5.0000 mg | ORAL_TABLET | Freq: Two times a day (BID) | ORAL | Status: DC

## 2015-12-10 NOTE — Telephone Encounter (Signed)
Per the patient, 2/21 or 2/22 may work for her. I have scheduled her for Tuesday 12/21/15 at 10:00 am with Dr. Stanford Breed. Will call her back with the date and time for encounter.

## 2015-12-10 NOTE — Telephone Encounter (Signed)
New message   Patient was seen in the office today     cardioversion can be done on next Thursday  2/16 if possible

## 2015-12-10 NOTE — Patient Instructions (Addendum)
Medication Instructions:  Your physician has recommended you make the following change in your medication:  1) Increase Eliquis 5 mg twice daily    Labwork: Your physician recommends that you return for lab work on Tuesday: BMP/CBC   Testing/Procedures Your physician has recommended that you have a Cardioversion (DCCV). Electrical Cardioversion uses a jolt of electricity to your heart either through paddles or wired patches attached to your chest. This is a controlled, usually prescheduled, procedure. Defibrillation is done under light anesthesia in the hospital, and you usually go home the day of the procedure. This is done to get your heart back into a normal rhythm. You are not awake for the procedure. Please see the instruction sheet given to you today.  Call back to schedule procedure---once you find date    Follow-Up: Your physician recommends that you schedule a follow-up appointment in: 4 weeks Tommye Standard, PA   Any Other Special Instructions Will Be Listed Below (If Applicable).     If you need a refill on your cardiac medications before your next appointment, please call your pharmacy.

## 2015-12-10 NOTE — Telephone Encounter (Signed)
I called Scheduling to check on 2/16 for a possible date for DCCV, however, there is no availabilty on that date. Per Scheduling, there is time on 2/15 (11 am/ 12 pm/ 1:30 pm/ 2:30 pm) or 2/20 ( 10 am/ 11 am) for DCCV. I made the patient aware of this. She will call me back today to confirm if one of these dates will work for her.

## 2015-12-10 NOTE — Telephone Encounter (Signed)
Called pt and went over cardioversion instructions, and that instructions would be left at registration for pt to retrieve when she comes for labs on 12/14/15.  Verbalizes understanding of instructions and where to pick up the instructions on her next visit.  Placed in envelope and left in proper folder.

## 2015-12-14 ENCOUNTER — Other Ambulatory Visit (INDEPENDENT_AMBULATORY_CARE_PROVIDER_SITE_OTHER): Payer: Medicare PPO | Admitting: *Deleted

## 2015-12-14 DIAGNOSIS — I481 Persistent atrial fibrillation: Secondary | ICD-10-CM | POA: Diagnosis not present

## 2015-12-14 DIAGNOSIS — I4819 Other persistent atrial fibrillation: Secondary | ICD-10-CM

## 2015-12-14 LAB — CBC WITH DIFFERENTIAL/PLATELET
Basophils Absolute: 0.1 10*3/uL (ref 0.0–0.1)
Basophils Relative: 1 % (ref 0–1)
Eosinophils Absolute: 0.1 10*3/uL (ref 0.0–0.7)
Eosinophils Relative: 2 % (ref 0–5)
HCT: 40.2 % (ref 36.0–46.0)
Hemoglobin: 13.2 g/dL (ref 12.0–15.0)
Lymphocytes Relative: 33 % (ref 12–46)
Lymphs Abs: 2 10*3/uL (ref 0.7–4.0)
MCH: 30.2 pg (ref 26.0–34.0)
MCHC: 32.8 g/dL (ref 30.0–36.0)
MCV: 92 fL (ref 78.0–100.0)
MPV: 10.7 fL (ref 8.6–12.4)
Monocytes Absolute: 0.5 10*3/uL (ref 0.1–1.0)
Monocytes Relative: 9 % (ref 3–12)
Neutro Abs: 3.4 10*3/uL (ref 1.7–7.7)
Neutrophils Relative %: 55 % (ref 43–77)
Platelets: 205 10*3/uL (ref 150–400)
RBC: 4.37 MIL/uL (ref 3.87–5.11)
RDW: 13.9 % (ref 11.5–15.5)
WBC: 6.1 10*3/uL (ref 4.0–10.5)

## 2015-12-14 LAB — BASIC METABOLIC PANEL
BUN: 16 mg/dL (ref 7–25)
CHLORIDE: 106 mmol/L (ref 98–110)
CO2: 28 mmol/L (ref 20–31)
Calcium: 9.2 mg/dL (ref 8.6–10.4)
Creat: 0.87 mg/dL (ref 0.60–0.88)
GLUCOSE: 96 mg/dL (ref 65–99)
POTASSIUM: 4.4 mmol/L (ref 3.5–5.3)
SODIUM: 139 mmol/L (ref 135–146)

## 2015-12-15 ENCOUNTER — Ambulatory Visit (INDEPENDENT_AMBULATORY_CARE_PROVIDER_SITE_OTHER): Payer: Medicare PPO | Admitting: Internal Medicine

## 2015-12-15 ENCOUNTER — Encounter: Payer: Self-pay | Admitting: Internal Medicine

## 2015-12-15 VITALS — BP 132/84 | HR 95 | Temp 98.7°F | Resp 20 | Wt 142.0 lb

## 2015-12-15 DIAGNOSIS — M79641 Pain in right hand: Secondary | ICD-10-CM | POA: Diagnosis not present

## 2015-12-15 DIAGNOSIS — I4819 Other persistent atrial fibrillation: Secondary | ICD-10-CM

## 2015-12-15 DIAGNOSIS — I481 Persistent atrial fibrillation: Secondary | ICD-10-CM | POA: Diagnosis not present

## 2015-12-15 DIAGNOSIS — M79642 Pain in left hand: Secondary | ICD-10-CM

## 2015-12-15 DIAGNOSIS — I10 Essential (primary) hypertension: Secondary | ICD-10-CM | POA: Diagnosis not present

## 2015-12-15 MED ORDER — DICLOFENAC SODIUM 1 % TD GEL: 4.0000 g | Freq: Four times a day (QID) | TRANSDERMAL | Status: DC | PRN

## 2015-12-15 NOTE — Progress Notes (Signed)
Subjective:    Patient ID: Molly Tucker, female    DOB: [birth year], 82 y.o.   MRN: [id number]  HPI  Here to f/u as unable to seee PCP (out of office), c/o 2 wks worsening bilat hand OA pain without joint effusions but causes stiffness and pain, hard to grasp as well as before. Pt denies chest pain, increased sob or doe, wheezing, orthopnea, PND, increased LE swelling, palpitations, dizziness or syncope.  Pt denies new neurological symptoms such as new headache, or facial or extremity weakness or numbness   Pt denies polydipsia, polyuria.   Past Medical History  Diagnosis Date  . Osteoporosis     S/P  biphosphonates x 5 yrs  . Carotid bruit 2005    right, carotid doppler: <39% occlusion  . Hyperplastic colonic polyp 2006    Dr. Carlean Purl, due 2016  . Hyperlipidemia     LDL goal =<120  . Hypertension   . Persistent atrial fibrillation ([place])   . Atrial tachycardia [facility])    Past Surgical History  Procedure Laterality Date  . Dilation and curettage of uterus    . Mouth surgery  2009-2010    implants, Dr. Marcelyn Ditty  . Colonoscopy w/ polypectomy  2006  . Cardioversion N/A 03/31/2015    Procedure: CARDIOVERSION;  Surgeon: Sanda Klein, MD;  Location: [facility];  Service: Cardiovascular;  Laterality: N/A;    reports that she quit smoking about 39 years ago. She does not have any smokeless tobacco history on file. She reports that she does not drink alcohol or use illicit drugs. family history includes Cancer in her paternal aunt and sister; Coronary artery disease in her father and sister. There is no history of Stroke. No Known Allergies Current Outpatient Prescriptions on File Prior to Visit  Medication Sig Dispense Refill  . acetaminophen (TYLENOL) 500 MG tablet Take 500 mg by mouth 2 (two) times daily as needed (arthritis pain).    Marland Kitchen apixaban (ELIQUIS) 5 MG TABS tablet Take 1 tablet (5 mg total) by mouth 2 (two) times daily. 60 tablet 11  . Calcium Carbonate-Vitamin D (CALCIUM  600+D) 600-400 MG-UNIT per tablet Take 1 tablet by mouth 2 (two) times daily.     Marland Kitchen Histamine Dihydrochloride ([place]STR[place] DREAM ARTHRITIS) 0.025 % CREA Apply 1 application topically 3 (three) times daily as needed (for arthritis pain).    . metoprolol succinate (TOPROL XL) 50 MG 24 hr tablet Take 1 tablet (50 mg total) by mouth daily. Take with or immediately following a meal. 90 tablet 3  . Multiple Vitamin (MULTIVITAMIN) tablet Take 1 tablet by mouth daily.     No current facility-administered medications on file prior to visit.   Review of Systems  Constitutional: Negative for unusual diaphoresis or night sweats HENT: Negative for ringing in ear or discharge Eyes: Negative for double vision or worsening visual disturbance.  Respiratory: Negative for choking and stridor.   Gastrointestinal: Negative for vomiting or other signifcant bowel change Genitourinary: Negative for hematuria or change in urine volume.  Musculoskeletal: Negative for other MSK pain or swelling Skin: Negative for color change and worsening wound.  Neurological: Negative for tremors and numbness other than noted  Psychiatric/Behavioral: Negative for decreased concentration or agitation other than above       Objective:   Physical Exam BP 132/84 mmHg  Pulse 95  Temp(Src) 98.7 F (37.1 C) (Oral)  Resp 20  Wt 142 lb (64.411 kg)  SpO2 95% VS noted,  Constitutional: Pt appears in no  significant distress HENT: Head: NCAT.  Right Ear: External ear normal.  Left Ear: External ear normal.  Eyes: . Pupils are equal, round, and reactive to light. Conjunctivae and EOM are normal Neck: Normal range of motion. Neck supple.  Cardiovascular: Normal rate and irregular rhythm.   Pulmonary/Chest: Effort normal and breath sounds without rales or wheezing.  Abd:  Soft, NT, ND, + BS Neurological: Pt is alert. Not confused , motor grossly intact Skin: Skin is warm. No rash, no LE edema Psychiatric: Pt behavior is normal. No  agitation.  Marked bilat hand OA bony changes to all finger joints    Assessment & Plan:

## 2015-12-15 NOTE — Progress Notes (Signed)
Pre visit review using our clinic review tool, if applicable. No additional management support is needed unless otherwise documented below in the visit note. 

## 2015-12-15 NOTE — Patient Instructions (Signed)
Please take all new medication as prescribed - the voltaren gel as needed, or the continued use of the [place] Dream cream  Please continue all other medications as before, and refills have been done if requested.  Please have the pharmacy call with any other refills you may need.  Please keep your appointments with your specialists as you may have planned

## 2015-12-16 ENCOUNTER — Telehealth: Payer: Self-pay | Admitting: *Deleted

## 2015-12-16 NOTE — Telephone Encounter (Signed)
-----   Message from [facility], [place] sent at 12/15/2015  7:15 PM EST ----- Please let the patient know her labs look good.  Thanks [employer]

## 2015-12-20 ENCOUNTER — Other Ambulatory Visit: Payer: Self-pay | Admitting: Physician Assistant

## 2015-12-20 MED ORDER — SODIUM CHLORIDE 0.9% FLUSH: 3.0000 mL | INTRAVENOUS | Status: DC | PRN

## 2015-12-20 MED ORDER — SODIUM CHLORIDE 0.9% FLUSH: 3.0000 mL | Freq: Two times a day (BID) | INTRAVENOUS | Status: DC

## 2015-12-20 MED ORDER — SODIUM CHLORIDE 0.9 % IV SOLN: 250.0000 mL | INTRAVENOUS | Status: DC

## 2015-12-20 NOTE — Assessment & Plan Note (Signed)
Mild to mod uncontrolled, tylenol not working, for volt gel topical prn,  to f/u any worsening symptoms or concerns

## 2015-12-20 NOTE — Assessment & Plan Note (Signed)
stable overall by history and exam, recent data reviewed with pt, and pt to continue medical treatment as before,  to f/u any worsening symptoms or concerns BP Readings from Last 3 Encounters:  12/15/15 132/84  12/10/15 118/78  11/26/15 110/78

## 2015-12-20 NOTE — Assessment & Plan Note (Signed)
Stable rate, cont to monitor,  to f/u any worsening symptoms or concerns

## 2015-12-21 ENCOUNTER — Encounter (HOSPITAL_COMMUNITY): Payer: Self-pay | Admitting: Critical Care Medicine

## 2015-12-21 ENCOUNTER — Ambulatory Visit (HOSPITAL_COMMUNITY): Payer: Medicare PPO | Admitting: Critical Care Medicine

## 2015-12-21 ENCOUNTER — Telehealth: Payer: Self-pay | Admitting: Physician Assistant

## 2015-12-21 ENCOUNTER — Encounter (HOSPITAL_COMMUNITY)
Admission: RE | Disposition: A | Discharge status: Home / Self Care | Payer: Self-pay | Source: Ambulatory Visit | Attending: Cardiology

## 2015-12-21 ENCOUNTER — Ambulatory Visit (HOSPITAL_COMMUNITY)
Admission: RE | Admit: 2015-12-21 | Discharge: 2015-12-21 | Disposition: A | Discharge status: Home / Self Care | Payer: Medicare PPO | Source: Ambulatory Visit | Attending: Cardiology | Admitting: Cardiology

## 2015-12-21 DIAGNOSIS — I481 Persistent atrial fibrillation: Secondary | ICD-10-CM | POA: Diagnosis not present

## 2015-12-21 DIAGNOSIS — I480 Paroxysmal atrial fibrillation: Secondary | ICD-10-CM

## 2015-12-21 DIAGNOSIS — Z7901 Long term (current) use of anticoagulants: Secondary | ICD-10-CM | POA: Diagnosis not present

## 2015-12-21 DIAGNOSIS — I10 Essential (primary) hypertension: Secondary | ICD-10-CM | POA: Diagnosis not present

## 2015-12-21 DIAGNOSIS — Z87891 Personal history of nicotine dependence: Secondary | ICD-10-CM | POA: Insufficient documentation

## 2015-12-21 DIAGNOSIS — E785 Hyperlipidemia, unspecified: Secondary | ICD-10-CM | POA: Diagnosis not present

## 2015-12-21 DIAGNOSIS — Z79899 Other long term (current) drug therapy: Secondary | ICD-10-CM | POA: Insufficient documentation

## 2015-12-21 HISTORY — PX: CARDIOVERSION: SHX1299

## 2015-12-21 SURGERY — CARDIOVERSION
Anesthesia: General

## 2015-12-21 MED ORDER — LIDOCAINE HCL (CARDIAC) 20 MG/ML IV SOLN
INTRAVENOUS | Status: DC | PRN
  Administered 2015-12-21: 50 mg via INTRATRACHEAL

## 2015-12-21 MED ORDER — PROPOFOL 10 MG/ML IV BOLUS
INTRAVENOUS | Status: DC | PRN
  Administered 2015-12-21: 80 mg via INTRAVENOUS

## 2015-12-21 MED ORDER — SODIUM CHLORIDE 0.9 % IV SOLN
INTRAVENOUS | Status: DC | PRN
  Administered 2015-12-21: 10:00:00 via INTRAVENOUS

## 2015-12-21 NOTE — Discharge Instructions (Signed)
Monitored Anesthesia Care °Monitored anesthesia care is an anesthesia service for a medical procedure. Anesthesia is the loss of the ability to feel pain. It is produced by medicines called anesthetics. It may affect a small area of your body (local anesthesia), a large area of your body (regional anesthesia), or your entire body (general anesthesia). The need for monitored anesthesia care depends your procedure, your condition, and the potential need for regional or general anesthesia. It is often provided during procedures where:  °· General anesthesia may be needed if there are complications. This is because you need special care when you are under general anesthesia.   °· You will be under local or regional anesthesia. This is so that you are able to have higher levels of anesthesia if needed.   °· You will receive calming medicines (sedatives). This is especially the case if sedatives are given to put you in a semi-conscious state of relaxation (deep sedation). This is because the amount of sedative needed to produce this state can be hard to predict. Too much of a sedative can produce general anesthesia. °Monitored anesthesia care is performed by one or more health care providers who have special training in all types of anesthesia. You will need to meet with these health care providers before your procedure. During this meeting, they will ask you about your medical history. They will also give you instructions to follow. (For example, you will need to stop eating and drinking before your procedure. You may also need to stop or change medicines you are taking.) During your procedure, your health care providers will stay with you. They will:  °· Watch your condition. This includes watching your blood pressure, breathing, and level of pain.   °· Diagnose and treat problems that occur.   °· Give medicines if they are needed. These may include calming medicines (sedatives) and anesthetics.   °· Make sure you are  comfortable.   °Having monitored anesthesia care does not necessarily mean that you will be under anesthesia. It does mean that your health care providers will be able to manage anesthesia if you need it or if it occurs. It also means that you will be able to have a different type of anesthesia than you are having if you need it. When your procedure is complete, your health care providers will continue to watch your condition. They will make sure any medicines wear off before you are allowed to go home.  °  °This information is not intended to replace advice given to you by your health care provider. Make sure you discuss any questions you have with your health care provider. °  °Document Released: 07/12/2005 Document Revised: 11/06/2014 Document Reviewed: 11/27/2012 °Elsevier Interactive Patient Education ©2016 Elsevier Inc. °Electrical Cardioversion, Care After °Refer to this sheet in the next few weeks. These instructions provide you with information on caring for yourself after your procedure. Your health care provider may also give you more specific instructions. Your treatment has been planned according to current medical practices, but problems sometimes occur. Call your health care provider if you have any problems or questions after your procedure. °WHAT TO EXPECT AFTER THE PROCEDURE °After your procedure, it is typical to have the following sensations: °· Some redness on the skin where the shocks were delivered. If this is tender, a sunburn lotion or hydrocortisone cream may help. °· Possible return of an abnormal heart rhythm within hours or days after the procedure. °HOME CARE INSTRUCTIONS °· Take medicines only as directed by your health care provider.   Be sure you understand how and when to take your medicine. °· Learn how to feel your pulse and check it often. °· Limit your activity for 48 hours after the procedure or as directed by your health care provider. °· Avoid or minimize caffeine and other  stimulants as directed by your health care provider. °SEEK MEDICAL CARE IF: °· You feel like your heart is beating too fast or your pulse is not regular. °· You have any questions about your medicines. °· You have bleeding that will not stop. °SEEK IMMEDIATE MEDICAL CARE IF: °· You are dizzy or feel faint. °· It is hard to breathe or you feel short of breath. °· There is a change in discomfort in your chest. °· Your speech is slurred or you have trouble moving an arm or leg on one side of your body. °· You get a serious muscle cramp that does not go away. °· Your fingers or toes turn cold or blue. °  °This information is not intended to replace advice given to you by your health care provider. Make sure you discuss any questions you have with your health care provider. °  °Document Released: 08/06/2013 Document Revised: 11/06/2014 Document Reviewed: 08/06/2013 °Elsevier Interactive Patient Education ©2016 Elsevier Inc. ° °

## 2015-12-21 NOTE — Anesthesia Procedure Notes (Signed)
Procedure Name: MAC Date/Time: 12/21/2015 9:59 AM Performed by: Merrilyn Puma B Pre-anesthesia Checklist: Patient identified, Emergency Drugs available, Suction available, Patient being monitored and Timeout performed Patient Re-evaluated:Patient Re-evaluated prior to inductionOxygen Delivery Method: Ambu bag Intubation Type: IV induction Placement Confirmation: breath sounds checked- equal and bilateral Dental Injury: Teeth and Oropharynx as per pre-operative assessment

## 2015-12-21 NOTE — Transfer of Care (Signed)
Immediate Anesthesia Transfer of Care Note  Patient: Molly Tucker  Procedure(s) Performed: Procedure(s): CARDIOVERSION (N/A)  Patient Location: Endoscopy Unit  Anesthesia Type:MAC  Level of Consciousness: awake, alert  and oriented  Airway & Oxygen Therapy: Patient Spontanous Breathing  Post-op Assessment: Report given to RN, Post -op Vital signs reviewed and stable and Patient moving all extremities X 4  Post vital signs: stable  Last Vitals:  Filed Vitals:   12/21/15 0851  BP: 144/105  Pulse: 98  Temp: 36.6 C  Resp: 17   HR 70, RR 19, BP 99991111  Complications: No apparent anesthesia complications

## 2015-12-21 NOTE — Telephone Encounter (Signed)
Spoke with patient yesterday, patient reported some on/off nausea for a week or so, no vomiting, fever or symptoms of illness, no changes in bowel/stool, no other symptoms, scheduled for her DCCV , she is asked to monitor her symptom, she reports feeling well otherwise, no SOB, thinks she is probably still in AF.  She confirms taking her Eliquis 5mg  BID without interruption for months.  Discussed with Endo RN early this morning patient's reports of some recent nausea, she stated she would discuss with the patient, notify anesthesia and MD.

## 2015-12-21 NOTE — H&P (Signed)
[name]  12/10/2015 9:00 AM  Office Visit  MRN:  [id number]   Description: Female DOB: Jan 29, 1932  Provider: Baldwin Jamaica, PA-C  Department: [facility] [facility]       Vital Signs  Most recent update: 12/10/2015 9:11 AM by Dionicio Stall, RN    BP Pulse Ht Wt BMI    118/78 mmHg 107 5\' 4"  (1.626 m) 140 lb 4 oz (63.617 kg) 24.06 kg/m2      Progress Notes      Baldwin Jamaica, PA-C at 12/09/2015 3:30 PM     Status: Addendum       Expand All Collapse All      Cardiology Office Note Date: 12/10/2015  Patient ID: Molly, Tucker 05-23-1932, MRN [id number] PCP: Unice Cobble, MD Electrophysiologist: [name]  Chief Complaint: suspects back in AF  History of Present Illness: Molly Tucker is a 82 y.o. female with history of PAFib, HTN, HLD, and osteoporosis, comes to the office today for seen [name] feeling like she is back in Afib. She denies any kind of CP, no rest or nighttime SOB, but her exertional capacity is less and more fatigued, no energy. She says now that she has been back in SR for a while and can tell she feels so much better when in normal rhythm. No dizziness, near syncope or syncope. She feels her heart beat irregular, or an awareness of her heart beat, not racing does not feel it is fast, but feels it beating differently.  She was lasts seen By [name] 11/26/15, doing well at that time with recurrent symptoms since her DCCV.  PAF Hx: Newly diagnosed April 2016 Originally treated with Diltiazem changed to Metoprolol secondary to edema DCCV 03/31/15   Past Medical History  Diagnosis Date  . Osteoporosis     S/P biphosphonates x 5 yrs  . Carotid bruit 2005    right, carotid doppler: <39% occlusion  . Hyperplastic colonic polyp 2006    Dr. Carlean Purl, due 2016  . Hyperlipidemia     LDL goal =<120  . Hypertension   . Persistent atrial fibrillation ([place])   . Atrial tachycardia [facility])      Past Surgical History  Procedure Laterality Date  . Dilation and curettage of uterus    . Mouth surgery  2009-2010    implants, Dr. Marcelyn Ditty  . Colonoscopy w/ polypectomy  2006  . Cardioversion N/A 03/31/2015    Procedure: CARDIOVERSION; Surgeon: Sanda Klein, MD; Location: [facility]; Service: Cardiovascular; Laterality: N/A;    Current Outpatient Prescriptions  Medication Sig Dispense Refill  . apixaban (ELIQUIS) 5 MG TABS tablet Take 1 tablet (5 mg total) by mouth 2 (two) times daily. 60 tablet 11  . Calcium Carbonate-Vitamin D (CALCIUM 600+D) 600-400 MG-UNIT per tablet Take 1 tablet by mouth 2 (two) times daily.     . metoprolol succinate (TOPROL XL) 50 MG 24 hr tablet Take 1 tablet (50 mg total) by mouth daily. Take with or immediately following a meal. 90 tablet 3  . Multiple Vitamin (MULTIVITAMIN) tablet Take 1 tablet by mouth daily.     No current facility-administered medications for this visit.    Allergies: Review of patient's allergies indicates no known allergies.   Social History: The patient  reports that she quit smoking about 39 years ago. She does not have any smokeless tobacco history on file. She reports that she does not drink alcohol or use illicit drugs.   Family History:  The patient's family history includes Cancer in her paternal aunt and sister; Coronary artery disease in her father and sister. There is no history of Stroke.  ROS: Please see the history of present illness.  All other systems are reviewed and otherwise negative.   PHYSICAL EXAM:  VS: BP 118/78 mmHg  Pulse 107  Ht 5\' 4"  (1.626 m)  Wt 140 lb 4 oz (63.617 kg)  BMI 24.06 kg/m2 BMI: Body mass index is 24.06 kg/(m^2). Well nourished, well developed, in no acute distress  HEENT: normocephalic, atraumatic  Neck: no JVD, carotid bruits or masses Cardiac: Irregular RR; no significant murmurs, no rubs, or  gallops Lungs: clear to auscultation bilaterally, no wheezing, rhonchi or rales  Abd: soft, nontender MS: no deformity age appropriate atrophy Ext: no edema  Skin: warm and dry, no rash Neuro: No gross deficits appreciated Psych: euthymic mood, full affect   EKG: Done today shows Afib 107bpm  03/08/15: Echocardiogram Study Conclusions - Left ventricle: The cavity size was normal. Wall thickness was normal. Systolic function was normal. The estimated ejection fraction was in the range of 55% to 60%. Wall motion was normal; there were no regional wall motion abnormalities. - Right ventricle: The cavity size was mildly dilated. - Right atrium: The atrium was mildly dilated. - Atrial septum: No defect or patent foramen ovale was identified. - Tricuspid valve: There was moderate regurgitation. LA 23mm  Recent Labs: 02/22/2015: Magnesium 2.2; TSH 1.02 03/30/2015: BUN 14; Creatinine, Ser 0.82; Hemoglobin 13.5; Platelets 196; Potassium 4.6; Sodium 141  No results found for requested labs within last 365 days.   CrCl cannot be calculated (Patient has no serum creatinine result on file.).   Wt Readings from Last 3 Encounters:  12/10/15 140 lb 4 oz (63.617 kg)  11/26/15 135 lb (61.236 kg)  04/14/15 133 lb (60.328 kg)     Other studies reviewed: Additional studies/records reviewed today include: summarized above  ASSESSMENT AND PLAN:  1. PAFib  CHADS2Vasc is 4 on low dose Eliquis (decreased at her last visit with age and weight)  Her weight today is 63kg rewieghed at 139lbs. She says she tends to be 135-145 over the years, ususally about 135-138lbs. We discussed at length today dosing of Eliquis with borderline weight at times, risk of bleeding/clotting. She never decreased the dose, and has never had any bleeding issues on the 5mg  BID dose, and feels her comfort level is with the 5mg  BID dose with stroke/clot prevention. She is counseled on  bleeding/signs of bleeding.  Discussed rhythm control strategies, she clearly feels better in SR, and is agreeable to repeat DCCV.  She has been chronically on Eliquis without missing doses  2. HTN  Appears well controlled  Disposition: F/u with DCCV when she is ready to schedule, she needs to discuss with her sister first for a date that works for them both, maybe in the next week or so, we will see her back in a month, +/- 2weeks post DCCV. She needs prefers to do her labs next week, rather then ave them drawn today. She will come in Monday nest week to get her BMET/CBC done. We will increase her Eliquis back to 5mg  BID.  Current medicines are reviewed at length with the patient today. The patient did not have any concerns regarding medicines.  Haywood Lasso, PA-C 12/10/2015 10:15 AM   [facility] [address] [place] [address] [phone] (office)  [phone] (fax)  For DCCV; no changes. Kirk Ruths

## 2015-12-21 NOTE — Anesthesia Postprocedure Evaluation (Signed)
Anesthesia Post Note  Patient: Molly Tucker  Procedure(s) Performed: Procedure(s) (LRB): CARDIOVERSION (N/A)  Patient location during evaluation: Endoscopy Anesthesia Type: MAC Level of consciousness: awake and alert and oriented Pain management: pain level controlled Vital Signs Assessment: post-procedure vital signs reviewed and stable Respiratory status: spontaneous breathing, nonlabored ventilation and respiratory function stable Cardiovascular status: blood pressure returned to baseline and stable Postop Assessment: no signs of nausea or vomiting Anesthetic complications: no    Last Vitals:  Filed Vitals:   12/21/15 1020 12/21/15 1030  BP: 108/60 137/61  Pulse: 65 64  Temp:    Resp: 14 17    Last Pain: There were no vitals filed for this visit.               Avalin Briley A.

## 2015-12-21 NOTE — Procedures (Signed)
Electrical Cardioversion Procedure Note CHRISHANA ZWIEG [id number] 09-19-1932  Procedure: Electrical Cardioversion Indications:  Atrial Fibrillation  Procedure Details Consent: Risks of procedure as well as the alternatives and risks of each were explained to the (patient/caregiver).  Consent for procedure obtained. Time Out: Verified patient identification, verified procedure, site/side was marked, verified correct patient position, special equipment/implants available, medications/allergies/relevent history reviewed, required imaging and test results available.  Performed  Patient placed on cardiac monitor, pulse oximetry, supplemental oxygen as necessary.  Sedation given: Patient sedated by anesthesia with lidocaine 50 mg and diprovan 80 mg IV. Pacer pads placed anterior and posterior chest.  Cardioverted 1 time(s).  Cardioverted at 120J.  Evaluation Findings: Post procedure EKG shows: NSR Complications: None Patient did tolerate procedure well.   Kirk Ruths 12/21/2015, 9:39 AM

## 2015-12-21 NOTE — Anesthesia Preprocedure Evaluation (Addendum)
Anesthesia Evaluation  Patient identified by MRN, date of birth, ID band Patient awake    Reviewed: Allergy & Precautions, NPO status , Patient's Chart, lab work & pertinent test results, reviewed documented beta blocker date and time   Airway Mallampati: I  TM Distance: >3 FB Neck ROM: Full    Dental no notable dental hx. (+) Dental Advisory Given, Teeth Intact   Pulmonary former smoker,    Pulmonary exam normal breath sounds clear to auscultation       Cardiovascular hypertension, Pt. on medications and Pt. on home beta blockers Normal cardiovascular exam+ dysrhythmias Atrial Fibrillation  Rhythm:Irregular Rate:Tachycardia     Neuro/Psych    GI/Hepatic   Endo/Other    Renal/GU      Musculoskeletal   Abdominal   Peds  Hematology   Anesthesia Other Findings   Reproductive/Obstetrics                          Anesthesia Physical Anesthesia Plan  ASA: II  Anesthesia Plan: General   Post-op Pain Management:    Induction: Intravenous  Airway Management Planned: Mask  Additional Equipment:   Intra-op Plan:   Post-operative Plan:   Informed Consent: I have reviewed the patients History and Physical, chart, labs and discussed the procedure including the risks, benefits and alternatives for the proposed anesthesia with the patient or authorized representative who has indicated his/her understanding and acceptance.   Dental advisory given  Plan Discussed with: Anesthesiologist, Surgeon and CRNA  Anesthesia Plan Comments:        Anesthesia Quick Evaluation

## 2015-12-22 ENCOUNTER — Encounter (HOSPITAL_COMMUNITY): Payer: Self-pay | Admitting: Cardiology

## 2015-12-23 ENCOUNTER — Telehealth: Payer: Self-pay | Admitting: Internal Medicine

## 2015-12-23 NOTE — Telephone Encounter (Signed)
Calling stating that she had a cardioversion on Tues and last night her feet were swollen.  States she was on her feet ALL day yesterday and admits to eating in restaurant and had more sodium than usual.  She weighted last PM and wt was 147 lbs.  She elevated her feet and this morning swelling is down.  Her Wt this morning is 142 lbs.  Advised that the swelling was not related to the cardioversion but a combination of her being on her feet all day and eating more salt.  She has not taken her BP since Tues but feels like her HR is regular.  Spoke w/Renee Sunday Spillers who agrees and suggests she weigh herself every morning, decrease salt intake and keep legs elevated. Advised pt to also take and record her BP and to let us know if the swelling doesn't decrease, BP is elevated or has SOB.  Also made her a follow up appointment with Tommye Standard on 3/21 at 10:00.  She verbalizes understanding and will call if necessary.

## 2015-12-23 NOTE — Telephone Encounter (Signed)
Pt had Cardioversion on Tuesday. Last night her feet and legs were really swollen,this morning they are down some.Is this something to be concerned about?

## 2016-01-17 NOTE — Progress Notes (Signed)
Cardiology Office Note Date:  01/18/2016  Patient ID:  Molly Tucker, Molly Tucker 05-18-1932, MRN [id number] PCP:  Binnie Rail, MD  Electrophysiologist:  [name]   Chief Complaint: suspects back in AF  History of Present Illness: Molly Tucker is a 82 y.o. female with history of PAFib, HTN, HLD, and osteoporosis, comes to the office today for seen [name] s/p her second DCCV.  She is feeling well.  She again remarks how much better she feels in rhythm.  She mentions for 2 days post DCCV she had swelling to her lower legs, wore support stockings and resolved without recurrence.  She denies any kind of CP, palpitations or SOB, no dizziness, near syncope or syncope, no symptoms of orthopnea or PND.  She denies any bleeding or signs of bleeding on the Eliquis.  PAF Hx: Newly diagnosed April 2016 Originally treated with Diltiazem changed to Metoprolol secondary to edema No AAD tx as of yet DCCV 03/31/15 DCCV 12/21/15   Past Medical History  Diagnosis Date  . Osteoporosis     S/P  biphosphonates x 5 yrs  . Carotid bruit 2005    right, carotid doppler: <39% occlusion  . Hyperplastic colonic polyp 2006    Dr. Carlean Purl, due 2016  . Hyperlipidemia     LDL goal =<120  . Hypertension   . Persistent atrial fibrillation ([place])   . Atrial tachycardia [facility])     Past Surgical History  Procedure Laterality Date  . Dilation and curettage of uterus    . Mouth surgery  2009-2010    implants, Dr. Marcelyn Ditty  . Colonoscopy w/ polypectomy  2006  . Cardioversion N/A 03/31/2015    Procedure: CARDIOVERSION;  Surgeon: Sanda Klein, MD;  Location: [place];  Service: Cardiovascular;  Laterality: N/A;  . Cardioversion N/A 12/21/2015    Procedure: CARDIOVERSION;  Surgeon: Lelon Perla, MD;  Location: [facility] ENDOSCOPY;  Service: Cardiovascular;  Laterality: N/A;    Current Outpatient Prescriptions  Medication Sig Dispense Refill  . acetaminophen (TYLENOL) 500 MG tablet Take 500 mg by mouth 2 (two)  times daily as needed (arthritis pain).    Marland Kitchen apixaban (ELIQUIS) 5 MG TABS tablet Take 1 tablet (5 mg total) by mouth 2 (two) times daily. 60 tablet 11  . Calcium Carbonate-Vitamin D (CALCIUM 600+D) 600-400 MG-UNIT per tablet Take 1 tablet by mouth 2 (two) times daily.     Marland Kitchen Histamine Dihydrochloride (AUSTRALIAN DREAM ARTHRITIS) 0.025 % CREA Apply 1 application topically 3 (three) times daily as needed (for arthritis pain).    . metoprolol succinate (TOPROL XL) 50 MG 24 hr tablet Take 1 tablet (50 mg total) by mouth daily. Take with or immediately following a meal. 90 tablet 3  . Multiple Vitamin (MULTIVITAMIN) tablet Take 1 tablet by mouth daily.     Current Facility-Administered Medications  Medication Dose Route Frequency Provider Last Rate Last Dose  . 0.9 %  sodium chloride infusion  250 mL Intravenous Continuous Blayne Garlick Dyane Dustman, PA-C      . sodium chloride flush (NS) 0.9 % injection 3 mL  3 mL Intravenous Q12H Martyn Timme Dyane Dustman, PA-C      . sodium chloride flush (NS) 0.9 % injection 3 mL  3 mL Intravenous PRN Baldwin Jamaica, PA-C        Allergies:   Review of patient's allergies indicates no known allergies.   Social History:  The patient  reports that she quit smoking about 39 years ago. She does  not have any smokeless tobacco history on file. She reports that she does not drink alcohol or use illicit drugs.   Family History:  The patient's family history includes Cancer in her paternal aunt and sister; Coronary artery disease in her father and sister. There is no history of Stroke.  ROS:  Please see the history of present illness.    All other systems are reviewed and otherwise negative.   PHYSICAL EXAM:  VS:  BP 130/70 mmHg  Pulse 68  Ht 5\' 4"  (1.626 m)  Wt 136 lb (61.689 kg)  BMI 23.33 kg/m2 BMI: Body mass index is 23.33 kg/(m^2). Well nourished, well developed, in no acute distress HEENT: normocephalic, atraumatic Neck: no JVD, carotid bruits or masses Cardiac:  RRR,  extrasystoles noted,  no significant murmurs, no rubs, or gallops Lungs:  clear to auscultation bilaterally, no wheezing, rhonchi or rales Abd: soft, nontender MS: no deformity age appropriate atrophy Ext: no edema Skin: warm and dry, no rash Neuro:  No gross deficits appreciated Psych: euthymic mood, full affect   EKG:  Done today shows SR, APCs (bigeminal)  03/08/15: Echocardiogram Study Conclusions - Left ventricle: The cavity size was normal. Wall thickness was normal. Systolic function was normal. The estimated ejection fraction was in the range of 55% to 60%. Wall motion was normal; there were no regional wall motion abnormalities. - Right ventricle: The cavity size was mildly dilated. - Right atrium: The atrium was mildly dilated. - Atrial septum: No defect or patent foramen ovale was identified. - Tricuspid valve: There was moderate regurgitation. LA 52mm  Recent Labs: 02/22/2015: Magnesium 2.2; TSH 1.02 12/14/2015: BUN 16; Creat 0.87; Hemoglobin 13.2; Platelets 205; Potassium 4.4; Sodium 139  No results found for requested labs within last 365 days.   CrCl cannot be calculated (Patient has no serum creatinine result on file.).   Wt Readings from Last 3 Encounters:  01/18/16 136 lb (61.689 kg)  12/21/15 142 lb (64.411 kg)  12/15/15 142 lb (64.411 kg)     Other studies reviewed: Additional studies/records reviewed today include: summarized above  ASSESSMENT AND PLAN:  1.   PAFib       CHADS2Vasc is 4 on Eliquis        DCCV 12/21/15       SR today       she clearly feels better in SR          2. HTN     Appears well controlled  Disposition: F/u in 71months, here or with the AF clinic sooner if needed.   Current medicines are reviewed at length with the patient today.  The patient did not have any concerns regarding medicines.  Haywood Lasso, PA-C 01/18/2016 10:51 AM     [facility] [address] [place]  [id number] [phone] (office)  [phone] (fax)

## 2016-01-18 ENCOUNTER — Ambulatory Visit (INDEPENDENT_AMBULATORY_CARE_PROVIDER_SITE_OTHER): Payer: Medicare PPO | Admitting: Physician Assistant

## 2016-01-18 ENCOUNTER — Encounter: Payer: Self-pay | Admitting: Physician Assistant

## 2016-01-18 VITALS — BP 130/70 | HR 68 | Ht 64.0 in | Wt 136.0 lb

## 2016-01-18 DIAGNOSIS — I10 Essential (primary) hypertension: Secondary | ICD-10-CM | POA: Diagnosis not present

## 2016-01-18 DIAGNOSIS — I481 Persistent atrial fibrillation: Secondary | ICD-10-CM | POA: Diagnosis not present

## 2016-01-18 DIAGNOSIS — I4819 Other persistent atrial fibrillation: Secondary | ICD-10-CM

## 2016-01-18 NOTE — Patient Instructions (Addendum)
Medication Instructions:   Your physician recommends that you continue on your current medications as directed. Please refer to the Current Medication list given to you today.   If you need a refill on your cardiac medications before your next appointment, please call your pharmacy.  Labwork:  NONE ORDER TODAY    Testing/Procedures:  NONE ORDER TODAY    Follow-Up:  Your physician wants you to follow-up in:  [place] will receive a reminder letter in the mail two months in advance. If you don't receive a letter, please call our office to schedule the follow-up appointment.  \   Any Other Special Instructions Will Be Listed Below (If Applicable).

## 2016-02-01 ENCOUNTER — Ambulatory Visit (INDEPENDENT_AMBULATORY_CARE_PROVIDER_SITE_OTHER): Payer: Medicare PPO | Admitting: Internal Medicine

## 2016-02-01 ENCOUNTER — Encounter: Payer: Self-pay | Admitting: Internal Medicine

## 2016-02-01 VITALS — BP 140/82 | HR 43 | Temp 98.2°F | Resp 16 | Wt 136.0 lb

## 2016-02-01 DIAGNOSIS — M19041 Primary osteoarthritis, right hand: Secondary | ICD-10-CM | POA: Diagnosis not present

## 2016-02-01 DIAGNOSIS — M19049 Primary osteoarthritis, unspecified hand: Secondary | ICD-10-CM | POA: Insufficient documentation

## 2016-02-01 DIAGNOSIS — Z0000 Encounter for general adult medical examination without abnormal findings: Secondary | ICD-10-CM | POA: Diagnosis not present

## 2016-02-01 DIAGNOSIS — M810 Age-related osteoporosis without current pathological fracture: Secondary | ICD-10-CM

## 2016-02-01 DIAGNOSIS — I10 Essential (primary) hypertension: Secondary | ICD-10-CM | POA: Diagnosis not present

## 2016-02-01 DIAGNOSIS — I481 Persistent atrial fibrillation: Secondary | ICD-10-CM

## 2016-02-01 DIAGNOSIS — M19042 Primary osteoarthritis, left hand: Secondary | ICD-10-CM

## 2016-02-01 DIAGNOSIS — I4819 Other persistent atrial fibrillation: Secondary | ICD-10-CM

## 2016-02-01 NOTE — Assessment & Plan Note (Addendum)
typically well controlled at home On metoprolol -  Continue Recent cmp reviewed

## 2016-02-01 NOTE — Assessment & Plan Note (Signed)
Tylenol as needed Can use otc topical creams Sample of diclofenac ointment given to try

## 2016-02-01 NOTE — Progress Notes (Signed)
Subjective:    Patient ID: Molly Tucker, female    DOB: 1932-07-26, 82 y.o.   MRN: [id number]  HPI Here for medicare wellness exam/physical exam.   She does have hand arthritis.  She takes tylenol as needed, which does help.    I have personally reviewed and have noted 1.The patient's medical and social history 2.Their use of alcohol, tobacco or illicit drugs 3.Their current medications and supplements 4.The patient's functional ability including ADL's, fall risks, home safety risks and                 hearing or visual impairment. 5.Diet and physical activities 6.Evidence for depression or mood disorders 7.Care team reviewed and updated (available in snapshot)   Are there smokers in your home (other than you)? No  Risk Factors Exercise: walks dog, mows yard Dietary issues discussed: eats out a lot  Cardiac risk factors: advanced age (older than 43 for men, 36 for women), hypertension, hyperlipidemia  Depression Screen  Have you felt down, depressed or hopeless? No  Have you felt little interest or pleasure in doing things?  No Activities of Daily Living In your present state of health, do you have any difficulty performing the following activities?:  Driving? No Managing money?  No Feeding yourself? No Getting from bed to chair? No Climbing a flight of stairs? No Preparing food and eating?: No Bathing or showering? No Getting dressed: No Getting to/using the toilet? No Moving around from place to place: No In the past year have you fallen or had a near fall?: No   Are you sexually active?  No  Do you have more than one partner?  N/A  Hearing Difficulties: No Do you often ask people to speak up or repeat themselves? No Do you experience ringing or noises in your ears? No Do you have difficulty understanding soft or whispered voices? No Vision:              Any change in vision:  No  Up to date with eye exam: Yes Memory:  Do you feel that you have a problem with memory? No  Do you often misplace items? No  Do you feel safe at home?  Yes  Cognitive Testing  Alert, Orientated? Yes  Normal Appearance? Yes  Recall of three objects?  Yes  Can perform simple calculations? Yes  Displays appropriate judgment? Yes  Can read the correct time from a watch face? Yes   Advanced Directives have been discussed with the patient? Yes, in place  Medications and allergies reviewed with patient and updated if appropriate.  Patient Active Problem List   Diagnosis Date Noted  . Bilateral hand pain 12/15/2015  . Persistent atrial fibrillation ([place])   . COLONIC POLYPS, HX OF 12/15/2008  . HYPERLIPIDEMIA 11/21/2007  . HTN (hypertension) 11/21/2007  . OSTEOPOROSIS 09/13/2007  . CAROTID BRUIT, RIGHT 09/05/2007    Current Outpatient Prescriptions on File Prior to Visit  Medication Sig Dispense Refill  . acetaminophen (TYLENOL) 500 MG tablet Take 500 mg by mouth 2 (two) times daily as needed (arthritis pain).    Marland Kitchen apixaban (ELIQUIS) 5 MG TABS tablet Take 1 tablet (5 mg total) by mouth 2 (two) times daily. 60 tablet 11  . Calcium Carbonate-Vitamin D (CALCIUM 600+D) 600-400 MG-UNIT per tablet Take 1 tablet by mouth 2 (two) times daily.     . metoprolol succinate (TOPROL XL) 50 MG 24 hr tablet Take 1 tablet (50 mg total) by mouth daily.  Take with or immediately following a meal. 90 tablet 3  . Multiple Vitamin (MULTIVITAMIN) tablet Take 1 tablet by mouth daily.     No current facility-administered medications on file prior to visit.    Past Medical History  Diagnosis Date  . Osteoporosis     S/P  biphosphonates x 5 yrs  . Carotid bruit 2005    right, carotid doppler: <39% occlusion  . Hyperplastic colonic polyp 2006    Dr. Carlean Purl, due 2016  . Hyperlipidemia     LDL goal =<120  . Hypertension   . Persistent atrial fibrillation ([place])   . Atrial tachycardia [facility])     Past  Surgical History  Procedure Laterality Date  . Dilation and curettage of uterus    . Mouth surgery  2009-2010    implants, Dr. Marcelyn Ditty  . Colonoscopy w/ polypectomy  2006  . Cardioversion N/A 03/31/2015    Procedure: CARDIOVERSION;  Surgeon: Sanda Klein, MD;  Location: [place];  Service: Cardiovascular;  Laterality: N/A;  . Cardioversion N/A 12/21/2015    Procedure: CARDIOVERSION;  Surgeon: Lelon Perla, MD;  Location: [facility] ENDOSCOPY;  Service: Cardiovascular;  Laterality: N/A;    Social History   Social History  . Marital Status: Married    Spouse Name: N/A  . Number of Children: N/A  . Years of Education: N/A   Occupational History  . Retired [employer]   Social History Main Topics  . Smoking status: Former Smoker    Quit date: 10/30/1976  . Smokeless tobacco: None     Comment: Patient would ONLY smoke occasionally   . Alcohol Use: No  . Drug Use: No  . Sexual Activity: Not Asked   Other Topics Concern  . None   Social History Narrative   Regular exercise: yes: walking 30 minute once daily    Family History  Problem Relation Age of Onset  . Coronary artery disease Father     MI in 62s  . Cancer Sister     breast  . Coronary artery disease Sister     stent 2009  . Cancer Paternal Aunt     colon, possible breast cancer  . Arthritis      aunts  . Stroke Neg Hx     Review of Systems  Constitutional: Negative for fever, chills, appetite change, fatigue and unexpected weight change.  HENT: Negative for hearing loss and tinnitus.   Eyes: Negative for visual disturbance.  Respiratory: Negative for cough, shortness of breath and wheezing.   Cardiovascular: Negative for chest pain, palpitations and leg swelling.  Gastrointestinal: Negative for nausea, abdominal pain, diarrhea, constipation and blood in stool.       No gerd  Genitourinary: Negative for dysuria and hematuria.  Musculoskeletal: Positive for arthralgias (hands only). Negative for myalgias and  back pain.  Skin: Negative for color change and rash.  Neurological: Negative for dizziness, light-headedness and headaches.  Psychiatric/Behavioral: Negative for sleep disturbance and dysphoric mood. The patient is not nervous/anxious.        Objective:   Filed Vitals:   02/01/16 0833  BP: 140/82  Pulse: 43  Temp: 98.2 F (36.8 C)  Resp: 16   Filed Weights   02/01/16 0833  Weight: 136 lb (61.689 kg)   Body mass index is 23.33 kg/(m^2).   Physical Exam Constitutional: She appears well-developed and well-nourished. No distress.  HENT:  Head: Normocephalic and atraumatic.  Right Ear: External ear normal. Normal ear canal and TM Left Ear: External  ear normal.  Normal ear canal and TM Mouth/Throat: Oropharynx is clear and moist.  Eyes: Conjunctivae and EOM are normal.  Neck: Neck supple. No tracheal deviation present. No thyromegaly present.  No carotid bruit  Cardiovascular: Normal rate, regular rhythm - bigeminy and normal heart sounds.   No murmur heard.  trace edema. Pulmonary/Chest: Effort normal and breath sounds normal. No respiratory distress. She has no wheezes. She has no rales.  Breast: deferred by patient Abdominal: Soft. She exhibits no distension. There is no tenderness.  Lymphadenopathy: She has no cervical adenopathy.  Skin: Skin is warm and dry. She is not diaphoretic.  Psychiatric: She has a normal mood and affect. Her behavior is normal.       Assessment & Plan:   Wellness Exam Immunizations - deferred shingles vaccine, deferred pneumonia vaccine Colonoscopy - not needed at this age 54 - not needed at this age, deferred dexa - she declined 8 - does not see gyn Eye exam - up to date Hearing loss - none Memory concerns/difficulties - none Independent of ADLs - fully independent   Physical exam: Screening blood work - reviewed recent blood work, deferred additional blood work Immunizations - deferred shingles vaccine, deferred pneumonia  vaccine Colonoscopy - not needed at this age 52 - not needed at this age, deferred dexa - she declined Gyn - does not see gyn Eye exam - up to date EKG - done by cardiology Exercise  --  Walks dogs,does some yard work and house work. Continue to be active  Weight --  Good, not overweight Skin  - no concerns Substance  - none   See Problem List for Assessment and Plan of chronic medical problems.  Follow up annually

## 2016-02-01 NOTE — Patient Instructions (Addendum)
Molly Tucker , Thank you for taking time to come for your Medicare Wellness Visit. I appreciate your ongoing commitment to your health goals. Please review the following plan we discussed and let me know if I can assist you in the future.   These are the goals we discussed: Goals    None      This is a list of the screening recommended for you and due dates:  Health Maintenance  Topic Date Due  . Shingles Vaccine  12/25/1993  . DEXA scan (bone density measurement)  12/25/1998  . Pneumonia vaccines (1 of 2 - PCV13) 10/30/2017*  . Flu Shot  05/30/2016  . Tetanus Vaccine  08/21/2021  *Topic was postponed. The date shown is not the original due date.     All other Health Maintenance issues reviewed.   All recommended immunizations and age-appropriate screenings are up-to-date or discussed.  No immunizations administered today.   Medications reviewed and updated.  No changes recommended at this time.   Please followup annually  Health Maintenance, Female Adopting a healthy lifestyle and getting preventive care can go a long way to promote health and wellness. Talk with your health care provider about what schedule of regular examinations is right for you. This is a good chance for you to check in with your provider about disease prevention and staying healthy. In between checkups, there are plenty of things you can do on your own. Experts have done a lot of research about which lifestyle changes and preventive measures are most likely to keep you healthy. Ask your health care provider for more information. WEIGHT AND DIET  Eat a healthy diet  Be sure to include plenty of vegetables, fruits, low-fat dairy products, and lean protein.  Do not eat a lot of foods high in solid fats, added sugars, or salt.  Get regular exercise. This is one of the most important things you can do for your health.  Most adults should exercise for at least 150 minutes each week. The exercise should  increase your heart rate and make you sweat (moderate-intensity exercise).  Most adults should also do strengthening exercises at least twice a week. This is in addition to the moderate-intensity exercise.  Maintain a healthy weight  Body mass index (BMI) is a measurement that can be used to identify possible weight problems. It estimates body fat based on height and weight. Your health care provider can help determine your BMI and help you achieve or maintain a healthy weight.  For females 52 years of age and older:   A BMI below 18.5 is considered underweight.  A BMI of 18.5 to 24.9 is normal.  A BMI of 25 to 29.9 is considered overweight.  A BMI of 30 and above is considered obese.  Watch levels of cholesterol and blood lipids  You should start having your blood tested for lipids and cholesterol at 80 years of age, then have this test every 5 years.  You may need to have your cholesterol levels checked more often if:  Your lipid or cholesterol levels are high.  You are older than 80 years of age.  You are at high risk for heart disease.  CANCER SCREENING   Lung Cancer  Lung cancer screening is recommended for adults 82-63 years old who are at high risk for lung cancer because of a history of smoking.  A yearly low-dose CT scan of the lungs is recommended for people who:  Currently smoke.  Have quit within  the past 15 years.  Have at least a 30-pack-year history of smoking. A pack year is smoking an average of one pack of cigarettes a day for 1 year.  Yearly screening should continue until it has been 15 years since you quit.  Yearly screening should stop if you develop a health problem that would prevent you from having lung cancer treatment.  Breast Cancer  Practice breast self-awareness. This means understanding how your breasts normally appear and feel.  It also means doing regular breast self-exams. Let your health care provider know about any changes, no  matter how small.  If you are in your 20s or 30s, you should have a clinical breast exam (CBE) by a health care provider every 1-3 years as part of a regular health exam.  If you are 61 or older, have a CBE every year. Also consider having a breast X-ray (mammogram) every year.  If you have a family history of breast cancer, talk to your health care provider about genetic screening.  If you are at high risk for breast cancer, talk to your health care provider about having an MRI and a mammogram every year.  Breast cancer gene (BRCA) assessment is recommended for women who have family members with BRCA-related cancers. BRCA-related cancers include:  Breast.  Ovarian.  Tubal.  Peritoneal cancers.  Results of the assessment will determine the need for genetic counseling and BRCA1 and BRCA2 testing. Cervical Cancer Your health care provider may recommend that you be screened regularly for cancer of the pelvic organs (ovaries, uterus, and vagina). This screening involves a pelvic examination, including checking for microscopic changes to the surface of your cervix (Pap test). You may be encouraged to have this screening done every 3 years, beginning at age 6.  For women ages 32-65, health care providers may recommend pelvic exams and Pap testing every 3 years, or they may recommend the Pap and pelvic exam, combined with testing for human papilloma virus (HPV), every 5 years. Some types of HPV increase your risk of cervical cancer. Testing for HPV may also be done on women of any age with unclear Pap test results.  Other health care providers may not recommend any screening for nonpregnant women who are considered low risk for pelvic cancer and who do not have symptoms. Ask your health care provider if a screening pelvic exam is right for you.  If you have had past treatment for cervical cancer or a condition that could lead to cancer, you need Pap tests and screening for cancer for at least  20 years after your treatment. If Pap tests have been discontinued, your risk factors (such as having a new sexual partner) need to be reassessed to determine if screening should resume. Some women have medical problems that increase the chance of getting cervical cancer. In these cases, your health care provider may recommend more frequent screening and Pap tests. Colorectal Cancer  This type of cancer can be detected and often prevented.  Routine colorectal cancer screening usually begins at 80 years of age and continues through 80 years of age.  Your health care provider may recommend screening at an earlier age if you have risk factors for colon cancer.  Your health care provider may also recommend using home test kits to check for hidden blood in the stool.  A small camera at the end of a tube can be used to examine your colon directly (sigmoidoscopy or colonoscopy). This is done to check for the earliest  forms of colorectal cancer.  Routine screening usually begins at age 49.  Direct examination of the colon should be repeated every 5-10 years through 80 years of age. However, you may need to be screened more often if early forms of precancerous polyps or small growths are found. Skin Cancer  Check your skin from head to toe regularly.  Tell your health care provider about any new moles or changes in moles, especially if there is a change in a mole's shape or color.  Also tell your health care provider if you have a mole that is larger than the size of a pencil eraser.  Always use sunscreen. Apply sunscreen liberally and repeatedly throughout the day.  Protect yourself by wearing long sleeves, pants, a wide-brimmed hat, and sunglasses whenever you are outside. HEART DISEASE, DIABETES, AND HIGH BLOOD PRESSURE   High blood pressure causes heart disease and increases the risk of stroke. High blood pressure is more likely to develop in:  People who have blood pressure in the high end  of the normal range (130-139/85-89 mm Hg).  People who are overweight or obese.  People who are African American.  If you are 30-53 years of age, have your blood pressure checked every 3-5 years. If you are 10 years of age or older, have your blood pressure checked every year. You should have your blood pressure measured twice--once when you are at a hospital or clinic, and once when you are not at a hospital or clinic. Record the average of the two measurements. To check your blood pressure when you are not at a hospital or clinic, you can use:  An automated blood pressure machine at a pharmacy.  A home blood pressure monitor.  If you are between 20 years and 78 years old, ask your health care provider if you should take aspirin to prevent strokes.  Have regular diabetes screenings. This involves taking a blood sample to check your fasting blood sugar level.  If you are at a normal weight and have a low risk for diabetes, have this test once every three years after 80 years of age.  If you are overweight and have a high risk for diabetes, consider being tested at a younger age or more often. PREVENTING INFECTION  Hepatitis B  If you have a higher risk for hepatitis B, you should be screened for this virus. You are considered at high risk for hepatitis B if:  You were born in a country where hepatitis B is common. Ask your health care provider which countries are considered high risk.  Your parents were born in a high-risk country, and you have not been immunized against hepatitis B (hepatitis B vaccine).  You have HIV or AIDS.  You use needles to inject street drugs.  You live with someone who has hepatitis B.  You have had sex with someone who has hepatitis B.  You get hemodialysis treatment.  You take certain medicines for conditions, including cancer, organ transplantation, and autoimmune conditions. Hepatitis C  Blood testing is recommended for:  Everyone born from  [birth year] through 1963.  Anyone with known risk factors for hepatitis C. Sexually transmitted infections (STIs)  You should be screened for sexually transmitted infections (STIs) including gonorrhea and chlamydia if:  You are sexually active and are younger than 80 years of age.  You are older than 80 years of age and your health care provider tells you that you are at risk for this type of infection.  Your sexual activity has changed since you were last screened and you are at an increased risk for chlamydia or gonorrhea. Ask your health care provider if you are at risk.  If you do not have HIV, but are at risk, it may be recommended that you take a prescription medicine daily to prevent HIV infection. This is called pre-exposure prophylaxis (PrEP). You are considered at risk if:  You are sexually active and do not regularly use condoms or know the HIV status of your partner(s).  You take drugs by injection.  You are sexually active with a partner who has HIV. Talk with your health care provider about whether you are at high risk of being infected with HIV. If you choose to begin PrEP, you should first be tested for HIV. You should then be tested every 3 months for as long as you are taking PrEP.  PREGNANCY   If you are premenopausal and you may become pregnant, ask your health care provider about preconception counseling.  If you may become pregnant, take 400 to 800 micrograms (mcg) of folic acid every day.  If you want to prevent pregnancy, talk to your health care provider about birth control (contraception). OSTEOPOROSIS AND MENOPAUSE   Osteoporosis is a disease in which the bones lose minerals and strength with aging. This can result in serious bone fractures. Your risk for osteoporosis can be identified using a bone density scan.  If you are 25 years of age or older, or if you are at risk for osteoporosis and fractures, ask your health care provider if you should be screened.  Ask  your health care provider whether you should take a calcium or vitamin D supplement to lower your risk for osteoporosis.  Menopause may have certain physical symptoms and risks.  Hormone replacement therapy may reduce some of these symptoms and risks. Talk to your health care provider about whether hormone replacement therapy is right for you.  HOME CARE INSTRUCTIONS   Schedule regular health, dental, and eye exams.  Stay current with your immunizations.   Do not use any tobacco products including cigarettes, chewing tobacco, or electronic cigarettes.  If you are pregnant, do not drink alcohol.  If you are breastfeeding, limit how much and how often you drink alcohol.  Limit alcohol intake to no more than 1 drink per day for nonpregnant women. One drink equals 12 ounces of beer, 5 ounces of wine, or 1 ounces of hard liquor.  Do not use street drugs.  Do not share needles.  Ask your health care provider for help if you need support or information about quitting drugs.  Tell your health care provider if you often feel depressed.  Tell your health care provider if you have ever been abused or do not feel safe at home.   This information is not intended to replace advice given to you by your health care provider. Make sure you discuss any questions you have with your health care provider.   Document Released: 05/01/2011 Document Revised: 11/06/2014 Document Reviewed: 09/17/2013 Elsevier Interactive Patient Education [employer].

## 2016-02-01 NOTE — Assessment & Plan Note (Signed)
S/p 2 conversions  Following with cardiology On eliquis and metoprolol Rate controlled

## 2016-02-01 NOTE — Assessment & Plan Note (Signed)
Declined dexa Taking calcium, vitamin d Walking some for exercise, very active

## 2016-02-01 NOTE — Progress Notes (Signed)
Pre visit review using our clinic review tool, if applicable. No additional management support is needed unless otherwise documented below in the visit note. 

## 2016-05-24 ENCOUNTER — Encounter: Payer: Self-pay | Admitting: *Deleted

## 2016-06-13 ENCOUNTER — Ambulatory Visit (INDEPENDENT_AMBULATORY_CARE_PROVIDER_SITE_OTHER): Payer: Medicare PPO | Admitting: Nurse Practitioner

## 2016-06-13 ENCOUNTER — Encounter: Payer: Self-pay | Admitting: Nurse Practitioner

## 2016-06-13 VITALS — BP 130/80 | HR 60 | Ht 64.0 in | Wt 141.0 lb

## 2016-06-13 DIAGNOSIS — Z7901 Long term (current) use of anticoagulants: Secondary | ICD-10-CM | POA: Diagnosis not present

## 2016-06-13 DIAGNOSIS — I480 Paroxysmal atrial fibrillation: Secondary | ICD-10-CM | POA: Diagnosis not present

## 2016-06-13 DIAGNOSIS — I10 Essential (primary) hypertension: Secondary | ICD-10-CM

## 2016-06-13 NOTE — Patient Instructions (Addendum)
We will be checking the following labs today - NONE   Medication Instructions:    Continue with your current medicines.    Ok to hold your Eliquis Sunday evening, Monday Am and PM and Tuesday Am.  Restart Tuesday PM    Testing/Procedures To Be Arranged:  N/A  Follow-Up:   See [name] in 6 months    Other Special Instructions:   N/A    If you need a refill on your cardiac medications before your next appointment, please call your pharmacy.   Call the [place] office at [phone] if you have any questions, problems or concerns.

## 2016-06-13 NOTE — Progress Notes (Signed)
CARDIOLOGY OFFICE NOTE  Date:  06/13/2016    Molly Tucker Date of Birth: 1932-05-20 Medical Record [id number]  PCP:  Binnie Rail, MD  Cardiologist:  Caryl Comes    Chief Complaint  Patient presents with  . Atrial Fibrillation    5 month check - seen for [name]    History of Present Illness: Molly Tucker is a 82 y.o. female who presents today for a 5 month check. Seen for [name].  She has a history of PAF, HTN, HLD, and osteoporosis. She has had prior cardioversions back to NSR - with improvement in symptoms. Cardioverted in June of 2016 and again in February of 2017.   Comes in today. Here alone. She says she is doing ok. No real awareness of any palpitations. BP good at home - sometimes even low. Not symptomatic with this. No chest pain. Breathing is good. Needs a back tooth extracted - has a crack in it. Remains on her Eliquis.   Past Medical History:  Diagnosis Date  . Atrial tachycardia ([place])   . Carotid bruit 2005   right, carotid doppler: <39% occlusion  . Hyperlipidemia    LDL goal =<120  . Hyperplastic colonic polyp 2006   Dr. Carlean Purl, due 2016  . Hypertension   . Osteoporosis    S/P  biphosphonates x 5 yrs  . Persistent atrial fibrillation [facility])     Past Surgical History:  Procedure Laterality Date  . CARDIOVERSION N/A 03/31/2015   Procedure: CARDIOVERSION;  Surgeon: Sanda Klein, MD;  Location: [facility];  Service: Cardiovascular;  Laterality: N/A;  . CARDIOVERSION N/A 12/21/2015   Procedure: CARDIOVERSION;  Surgeon: Lelon Perla, MD;  Location: [facility];  Service: Cardiovascular;  Laterality: N/A;  . COLONOSCOPY W/ POLYPECTOMY  2006  . DILATION AND CURETTAGE OF UTERUS    . MOUTH SURGERY  2009-2010   implants, Dr. Marcelyn Ditty     Medications: Current Outpatient Prescriptions  Medication Sig Dispense Refill  . acetaminophen (TYLENOL) 500 MG tablet Take 500 mg by mouth 2 (two) times daily as needed (arthritis pain).    Marland Kitchen apixaban  (ELIQUIS) 5 MG TABS tablet Take 1 tablet (5 mg total) by mouth 2 (two) times daily. 60 tablet 11  . metoprolol succinate (TOPROL XL) 50 MG 24 hr tablet Take 1 tablet (50 mg total) by mouth daily. Take with or immediately following a meal. 90 tablet 3  . Multiple Minerals-Vitamins (CAL-MAG-ZINC-D PO) Take 1 tablet by mouth every evening.    . Multiple Vitamin (MULTIVITAMIN) tablet Take 1 tablet by mouth daily.     No current facility-administered medications for this visit.     Allergies: No Known Allergies  Social History: The patient  reports that she quit smoking about 39 years ago. She does not have any smokeless tobacco history on file. She reports that she does not drink alcohol or use drugs.   Family History: The patient's family history includes Cancer in her paternal aunt and sister; Coronary artery disease in her father and sister.   Review of Systems: Please see the history of present illness.   Otherwise, the review of systems is positive for none.   All other systems are reviewed and negative.   Physical Exam: VS:  BP 130/80   Pulse 60   Ht 5\' 4"  (1.626 m)   Wt 141 lb (64 kg)   SpO2 96% Comment: at rest  BMI 24.20 kg/m  .  BMI Body mass index  is 24.2 kg/m.  Wt Readings from Last 3 Encounters:  06/13/16 141 lb (64 kg)  02/01/16 136 lb (61.7 kg)  01/18/16 136 lb (61.7 kg)    General: Pleasant. Well developed, well nourished and in no acute distress.   HEENT: Normal.  Neck: Supple, no JVD, carotid bruits, or masses noted.  Cardiac: Irregular rhythm today. Rate is fine. No edema.  Respiratory:  Lungs are clear to auscultation bilaterally with normal work of breathing.  GI: Soft and nontender.  MS: No deformity or atrophy. Gait and ROM intact.  Skin: Warm and dry. Color is normal.  Neuro:  Strength and sensation are intact and no gross focal deficits noted.  Psych: Alert, appropriate and with normal affect.   LABORATORY DATA:  EKG:  EKG is ordered today. This  shows  Lab Results  Component Value Date   WBC 6.1 12/14/2015   HGB 13.2 12/14/2015   HCT 40.2 12/14/2015   PLT 205 12/14/2015   GLUCOSE 96 12/14/2015   CHOL 197 08/22/2011   TRIG 82.0 08/22/2011   HDL 59.40 08/22/2011   LDLCALC 121 (H) 08/22/2011   ALT 15 08/22/2011   AST 22 08/22/2011   NA 139 12/14/2015   K 4.4 12/14/2015   CL 106 12/14/2015   CREATININE 0.87 12/14/2015   BUN 16 12/14/2015   CO2 28 12/14/2015   TSH 1.02 02/22/2015   HGBA1C 5.8 12/22/2009    BNP (last 3 results) No results for input(s): BNP in the last 8760 hours.  ProBNP (last 3 results) No results for input(s): PROBNP in the last 8760 hours.   Other Studies Reviewed Today:  Echo Study Conclusions 02/2015  - Left ventricle: The cavity size was normal. Wall thickness was   normal. Systolic function was normal. The estimated ejection   fraction was in the range of 55% to 60%. Wall motion was normal;   there were no regional wall motion abnormalities. - Right ventricle: The cavity size was mildly dilated. - Right atrium: The atrium was mildly dilated. - Atrial septum: No defect or patent foramen ovale was identified. - Tricuspid valve: There was moderate regurgitation.   Assessment/Plan: 1. PAF - prior cardioversion x 2 - checking EKG today - she has PACs on EKG today. Remains in NSR.   2. Chronic anticoagulation with Eliquis  3. Need for dental extraction - will hold Sunday pm, Monday and Tuesday Am and then restart Tuesday PM.   Current medicines are reviewed with the patient today.  The patient does not have concerns regarding medicines other than what has been noted above.  The following changes have been made:  See above.  Labs/ tests ordered today include:    Orders Placed This Encounter  Procedures  . EKG 12-Lead     Disposition:   FU with [name] in 6 months.    Patient is agreeable to this plan and will call if any problems develop in the interim.   Signed: Burtis Junes, RN, ANP-C 06/13/2016 3:04 PM  [facility] [address] [place]  [address] Phone: [phone] Fax: [phone]

## 2016-07-22 ENCOUNTER — Other Ambulatory Visit: Payer: Self-pay | Admitting: Internal Medicine

## 2016-10-10 ENCOUNTER — Encounter: Payer: Self-pay | Admitting: Nurse Practitioner

## 2016-11-08 ENCOUNTER — Other Ambulatory Visit: Payer: Medicare PPO | Admitting: *Deleted

## 2016-11-08 ENCOUNTER — Other Ambulatory Visit: Payer: Self-pay | Admitting: Nurse Practitioner

## 2016-11-08 DIAGNOSIS — Z7901 Long term (current) use of anticoagulants: Secondary | ICD-10-CM

## 2016-11-08 DIAGNOSIS — I480 Paroxysmal atrial fibrillation: Secondary | ICD-10-CM

## 2016-11-08 MED ORDER — METOPROLOL SUCCINATE ER 50 MG PO TB24: 50.0000 mg | ORAL_TABLET | Freq: Every day | ORAL | 3 refills | Status: DC

## 2016-11-08 NOTE — Progress Notes (Signed)
Seen with her husband today - she needs her Metoprolol refilled. Will get her follow up labs for her Eliquis as well.  I will see back in 4 months when I see her husband.   She is otherwise doing well.  Burtis Junes, RN, [facility] [address] [place], [place]  [address] [phone]

## 2016-11-09 ENCOUNTER — Telehealth: Payer: Self-pay

## 2016-11-09 DIAGNOSIS — I10 Essential (primary) hypertension: Secondary | ICD-10-CM

## 2016-11-09 LAB — BASIC METABOLIC PANEL
BUN/Creatinine Ratio: 16 (ref 12–28)
BUN: 28 mg/dL — ABNORMAL HIGH (ref 8–27)
CO2: 22 mmol/L (ref 18–29)
Calcium: 9.2 mg/dL (ref 8.7–10.3)
Chloride: 104 mmol/L (ref 96–106)
Creatinine, Ser: 1.8 mg/dL — ABNORMAL HIGH (ref 0.57–1.00)
GFR calc Af Amer: 30 mL/min/{1.73_m2} — ABNORMAL LOW (ref >59)
GFR calc non Af Amer: 26 mL/min/{1.73_m2} — ABNORMAL LOW (ref >59)
Glucose: 140 mg/dL — ABNORMAL HIGH (ref 65–99)
Potassium: 4.7 mmol/L (ref 3.5–5.2)
Sodium: 141 mmol/L (ref 134–144)

## 2016-11-09 LAB — CBC
Hematocrit: 40 % (ref 34.0–46.6)
Hemoglobin: 13.2 g/dL (ref 11.1–15.9)
MCH: 30.1 pg (ref 26.6–33.0)
MCHC: 33 g/dL (ref 31.5–35.7)
MCV: 91 fL (ref 79–97)
Platelets: 217 10*3/uL (ref 150–379)
RBC: 4.39 x10E6/uL (ref 3.77–5.28)
RDW: 13.8 % (ref 12.3–15.4)
WBC: 6.7 10*3/uL (ref 3.4–10.8)

## 2016-11-09 NOTE — Telephone Encounter (Signed)
Informed patient of results and verbal understanding expressed.  BMET scheduled next Wednesday. Patient understands to avoid NSAIDs and increase water intake.  She agrees with treatment plan.  Letter from [place] with ostomy care information both mailed to patient and placed at front desk for pick-up.

## 2016-11-09 NOTE — Telephone Encounter (Signed)
-----   Message from Burtis Junes, NP sent at 11/09/2016  7:51 AM EST ----- Ok to report. Husband had labs as well. Labs are stable but she has worsening kidney function - needs to avoid NSAIDs, increase water. I would like to recheck a BMET in one week - if kidney function stays like this - will need to be on a lower dose of Eliquis.

## 2016-11-15 ENCOUNTER — Other Ambulatory Visit: Payer: Medicare PPO

## 2016-11-21 ENCOUNTER — Other Ambulatory Visit: Payer: Medicare PPO | Admitting: *Deleted

## 2016-11-21 DIAGNOSIS — I10 Essential (primary) hypertension: Secondary | ICD-10-CM

## 2016-11-22 LAB — BASIC METABOLIC PANEL
BUN/Creatinine Ratio: 19 (ref 12–28)
BUN: 13 mg/dL (ref 8–27)
CO2: 24 mmol/L (ref 18–29)
Calcium: 9.1 mg/dL (ref 8.7–10.3)
Chloride: 103 mmol/L (ref 96–106)
Creatinine, Ser: 0.69 mg/dL (ref 0.57–1.00)
GFR calc Af Amer: 94 mL/min/{1.73_m2} (ref >59)
GFR calc non Af Amer: 81 mL/min/{1.73_m2} (ref >59)
Glucose: 116 mg/dL — ABNORMAL HIGH (ref 65–99)
Potassium: 4.7 mmol/L (ref 3.5–5.2)
Sodium: 141 mmol/L (ref 134–144)

## 2016-12-18 ENCOUNTER — Encounter: Payer: Self-pay | Admitting: Nurse Practitioner

## 2016-12-18 ENCOUNTER — Telehealth: Payer: Self-pay | Admitting: *Deleted

## 2016-12-18 NOTE — Telephone Encounter (Signed)
S/w pt about my chart question.  Scheduled pt appointment for tomorrow.  Pt agreeable to plan.  Pt does not have any symptoms left of GI bug.  Pt stated if [name] wanted to do a cardioversion, pt cannot do right now.  Son is having surgery Thursday and pt has to watch grandkids before and after surgery.  Will discuss in more detail at [facility] tomorrow.  Will send to [place] to [place].

## 2016-12-19 ENCOUNTER — Ambulatory Visit (INDEPENDENT_AMBULATORY_CARE_PROVIDER_SITE_OTHER): Payer: Medicare PPO | Admitting: Nurse Practitioner

## 2016-12-19 ENCOUNTER — Encounter: Payer: Self-pay | Admitting: Nurse Practitioner

## 2016-12-19 VITALS — BP 140/80 | HR 92 | Ht 64.0 in | Wt 144.8 lb

## 2016-12-19 DIAGNOSIS — I480 Paroxysmal atrial fibrillation: Secondary | ICD-10-CM | POA: Diagnosis not present

## 2016-12-19 MED ORDER — METOPROLOL SUCCINATE ER 25 MG PO TB24: 25.0000 mg | ORAL_TABLET | Freq: Every day | ORAL | 6 refills | Status: DC

## 2016-12-19 NOTE — Progress Notes (Signed)
CARDIOLOGY OFFICE NOTE  Date:  12/19/2016    Molly Tucker Date of Birth: 19-Feb-1932 Medical Record [id number]  PCP:  Binnie Rail, MD  Cardiologist:  Ree Shay  No chief complaint on file.   History of Present Illness: Molly Tucker is a 83 y.o. female who presents today for a work in visit. Seen for [name].  She has a history of PAF, HTN, HLD, and osteoporosis. She has had prior cardioversions back to NSR - with improvement in symptoms. Cardioverted in June of 2016 and again in February of 2017.   I last saw her back in August - was doing well.   Called earlier this week - felt to be back in AF. Asked to come back in so we could get an EKG.   Comes in today. Here alone. She had the stomach bug weekend before last - then noted that she felt like she was out of rhythm. She is short of breath with activity. No passing out. No chest pain. Lots of stress at home right now - sister (who usually sits with [name]) is out of town. Son having a carotid on Thursday and can't drive so she will be picking up grandkids, etc. Her life is pretty hectic until after March 1st. BP is ok.    Past Medical History:  Diagnosis Date  . Atrial tachycardia ([place])   . Carotid bruit 2005   right, carotid doppler: <39% occlusion  . Hyperlipidemia    LDL goal =<120  . Hyperplastic colonic polyp 2006   Dr. Carlean Purl, due 2016  . Hypertension   . Osteoporosis    S/P  biphosphonates x 5 yrs  . Persistent atrial fibrillation [facility])     Past Surgical History:  Procedure Laterality Date  . CARDIOVERSION N/A 03/31/2015   Procedure: CARDIOVERSION;  Surgeon: Sanda Klein, MD;  Location: [facility];  Service: Cardiovascular;  Laterality: N/A;  . CARDIOVERSION N/A 12/21/2015   Procedure: CARDIOVERSION;  Surgeon: Lelon Perla, MD;  Location: [facility];  Service: Cardiovascular;  Laterality: N/A;  . COLONOSCOPY W/ POLYPECTOMY  2006  . DILATION AND CURETTAGE OF UTERUS    . MOUTH  SURGERY  2009-2010   implants, Dr. Marcelyn Ditty     Medications: Current Outpatient Prescriptions  Medication Sig Dispense Refill  . acetaminophen (TYLENOL) 500 MG tablet Take 500 mg by mouth 2 (two) times daily as needed (arthritis pain).    Marland Kitchen ELIQUIS 5 MG TABS tablet TAKE 1 TABLET (5 MG TOTAL) BY MOUTH 2 (TWO) TIMES DAILY. 180 tablet 3  . metoprolol succinate (TOPROL XL) 50 MG 24 hr tablet Take 1 tablet (50 mg total) by mouth daily. Take with or immediately following a meal. 90 tablet 3  . Multiple Minerals-Vitamins (CAL-MAG-ZINC-D PO) Take 1 tablet by mouth every evening.    . Multiple Vitamin (MULTIVITAMIN) tablet Take 1 tablet by mouth daily.     No current facility-administered medications for this visit.     Allergies: No Known Allergies  Social History: The patient  reports that she quit smoking about 40 years ago. She has never used smokeless tobacco. She reports that she does not drink alcohol or use drugs.   Family History: The patient's family history includes Cancer in her paternal aunt and sister; Coronary artery disease in her father and sister.   Review of Systems: Please see the history of present illness.   Otherwise, the review of systems is positive for none.  All other systems are reviewed and negative.   Physical Exam: VS:  BP 140/80   Pulse 92   Ht 5\' 4"  (1.626 m)   Wt 144 lb 12.8 oz (65.7 kg)   BMI 24.85 kg/m  .  BMI Body mass index is 24.85 kg/m.  Wt Readings from Last 3 Encounters:  12/19/16 144 lb 12.8 oz (65.7 kg)  06/13/16 141 lb (64 kg)  02/01/16 136 lb (61.7 kg)    General: Pleasant. Well developed, well nourished and in no acute distress.   HEENT: Normal.  Neck: Supple, no JVD, carotid bruits, or masses noted.  Cardiac: Irregular irregular rhythm. Rate is ok - a little fast. No murmurs, rubs, or gallops. No edema.  Respiratory:  Lungs are clear to auscultation bilaterally with normal work of breathing.  GI: Soft and nontender.  MS: No  deformity or atrophy. Gait and ROM intact.  Skin: Warm and dry. Color is normal.  Neuro:  Strength and sensation are intact and no gross focal deficits noted.  Psych: Alert, appropriate and with normal affect.   LABORATORY DATA:  EKG:  EKG is ordered today. This demonstrates AF with a rate of 92 - reviewed with [name].  Lab Results  Component Value Date   WBC 6.7 11/08/2016   HGB 13.2 12/14/2015   HCT 40.0 11/08/2016   PLT 217 11/08/2016   GLUCOSE 116 (H) 11/21/2016   CHOL 197 08/22/2011   TRIG 82.0 08/22/2011   HDL 59.40 08/22/2011   LDLCALC 121 (H) 08/22/2011   ALT 15 08/22/2011   AST 22 08/22/2011   NA 141 11/21/2016   K 4.7 11/21/2016   CL 103 11/21/2016   CREATININE 0.69 11/21/2016   BUN 13 11/21/2016   CO2 24 11/21/2016   TSH 1.02 02/22/2015   HGBA1C 5.8 12/22/2009    BNP (last 3 results) No results for input(s): BNP in the last 8760 hours.  ProBNP (last 3 results) No results for input(s): PROBNP in the last 8760 hours.   Other Studies Reviewed Today:  Echo Study Conclusions 02/2015  - Left ventricle: The cavity size was normal. Wall thickness was normal. Systolic function was normal. The estimated ejection fraction was in the range of 55% to 60%. Wall motion was normal; there were no regional wall motion abnormalities. - Right ventricle: The cavity size was mildly dilated. - Right atrium: The atrium was mildly dilated. - Atrial septum: No defect or patent foramen ovale was identified. - Tricuspid valve: There was moderate regurgitation.   Assessment/Plan: 1. PAF - prior cardioversion x 2 - last back in February of 2017. Discussed with [name] - will hold on AAD therapy for now. Will arrange cardioversion after March 1st. Will add Toprol 25 mg in the AM. BMET today.   2. Chronic anticoagulation with Eliquis - no missed doses. She will continue. No falls noted.   3. Recent stomach bug - check BMET today - this is resolved.   Current  medicines are reviewed with the patient today.  The patient does not have concerns regarding medicines other than what has been noted above.  The following changes have been made:  See above.  Labs/ tests ordered today include:   No orders of the defined types were placed in this encounter.    Disposition:   FU with me in early March with EKG.  Patient is agreeable to this plan and will call if any problems develop in the interim.   Signed: [name], NP  12/19/2016 3:42 PM  [facility] [address] [place], [place]  [address] Phone: [phone] Fax: [phone]

## 2016-12-19 NOTE — Patient Instructions (Signed)
We will be checking the following labs today - BMET   Medication Instructions:    Continue with your current medicines. BUT  I am adding Metoprolol 25 mg to take in the AM - continue the 50 mg in the PM    Testing/Procedures To Be Arranged:  N/A  Follow-Up:   See me on March 5th with EKG - will most likely plan cardioversion at that time.    Other Special Instructions:   N/A    If you need a refill on your cardiac medications before your next appointment, please call your pharmacy.   Call the [place] office at [phone] if you have any questions, problems or concerns.

## 2016-12-20 LAB — BASIC METABOLIC PANEL
BUN/Creatinine Ratio: 19 (ref 12–28)
BUN: 16 mg/dL (ref 8–27)
CO2: 25 mmol/L (ref 18–29)
Calcium: 9 mg/dL (ref 8.7–10.3)
Chloride: 103 mmol/L (ref 96–106)
Creatinine, Ser: 0.83 mg/dL (ref 0.57–1.00)
GFR calc Af Amer: 76 (ref >59)
GFR calc non Af Amer: 66 (ref >59)
Glucose: 86 mg/dL (ref 65–99)
Potassium: 5 mmol/L (ref 3.5–5.2)
Sodium: 142 mmol/L (ref 134–144)

## 2016-12-21 ENCOUNTER — Encounter: Payer: Self-pay | Admitting: Nurse Practitioner

## 2017-01-01 ENCOUNTER — Encounter: Payer: Self-pay | Admitting: Nurse Practitioner

## 2017-01-01 ENCOUNTER — Other Ambulatory Visit: Payer: Self-pay | Admitting: Nurse Practitioner

## 2017-01-01 ENCOUNTER — Ambulatory Visit (INDEPENDENT_AMBULATORY_CARE_PROVIDER_SITE_OTHER): Payer: Medicare PPO | Admitting: Nurse Practitioner

## 2017-01-01 VITALS — BP 148/100 | HR 99 | Ht 64.0 in | Wt 150.8 lb

## 2017-01-01 DIAGNOSIS — I4819 Other persistent atrial fibrillation: Secondary | ICD-10-CM

## 2017-01-01 DIAGNOSIS — I481 Persistent atrial fibrillation: Secondary | ICD-10-CM

## 2017-01-01 MED ORDER — FUROSEMIDE 20 MG PO TABS: 20.0000 mg | ORAL_TABLET | Freq: Every day | ORAL | 6 refills | Status: DC | PRN

## 2017-01-01 NOTE — Progress Notes (Signed)
   CARDIOLOGY OFFICE NOTE  Date:  01/01/2017    Molly Tucker Date of Birth: 05/20/1932 Medical Record #[id number]  PCP:  Stacy J Burns, MD  Cardiologist:  [facility]  Chief Complaint  Patient presents with  . Atrial Fibrillation    Follow up visit - seen for Dr. Klein    History of Present Illness: Molly Tucker is a 83 y.o. female who presents today for a follow up visit. Seen for Dr. Klein.  She has a history of PAF, HTN, HLD, and osteoporosis. She has had prior cardioversions back to NSR - with improvement in symptoms. Cardioverted in June of 2016 and again in February of 2017.   I last saw her back in August - was doing well.   Called towards the end of last month - felt to be back in AF. Asked to come back in so we could get an EKG. She was back in AF by EKG. Lots of stress at home - her sister (who usually sits with Jack) was out of town. Son was having a carotid and could not drive for a month so she will be picking up grandkids, etc. Her life was pretty hectic until after March 1st. Discussed with Dr. Klein - we opted to increase her Toprol and hold on AAD therapy for now.   Comes in today. Here alone. She is tired. She has more swelling. Weight is up. She admits she is getting too much salt - her and Jack eat out most meals - she had country ham yesterday. She wants to now proceed with cardioversion. Sister now available to provide her transportation. Son got thru his carotid surgery.  No missed doses of eliquis.   Past Medical History:  Diagnosis Date  . Atrial tachycardia ([facility])   . Carotid bruit 2005   right, carotid doppler: <39% occlusion  . Hyperlipidemia    LDL goal =<120  . Hyperplastic colonic polyp 2006   Dr. Gessner, due 2016  . Hypertension   . Osteoporosis    S/P  biphosphonates x 5 yrs  . Persistent atrial fibrillation ([facility])     Past Surgical History:  Procedure Laterality Date  . CARDIOVERSION N/A 03/31/2015   Procedure:  CARDIOVERSION;  Surgeon: Mihai Croitoru, MD;  Location: [facility];  Service: Cardiovascular;  Laterality: N/A;  . CARDIOVERSION N/A 12/21/2015   Procedure: CARDIOVERSION;  Surgeon: Brian S Crenshaw, MD;  Location: [facility];  Service: Cardiovascular;  Laterality: N/A;  . COLONOSCOPY W/ POLYPECTOMY  2006  . DILATION AND CURETTAGE OF UTERUS    . MOUTH SURGERY  2009-2010   implants, Dr. Wm Brown     Medications: Current Outpatient Prescriptions  Medication Sig Dispense Refill  . acetaminophen (TYLENOL) 500 MG tablet Take 500 mg by mouth 2 (two) times daily as needed (arthritis pain).    . ELIQUIS 5 MG TABS tablet TAKE 1 TABLET (5 MG TOTAL) BY MOUTH 2 (TWO) TIMES DAILY. 180 tablet 3  . metoprolol succinate (TOPROL XL) 25 MG 24 hr tablet Take 1 tablet (25 mg total) by mouth daily. 25 mg in the AM and 50 mg in the PM 30 tablet 6  . metoprolol succinate (TOPROL XL) 50 MG 24 hr tablet Take 1 tablet (50 mg total) by mouth daily. Take with or immediately following a meal. 90 tablet 3  . Multiple Minerals-Vitamins (CAL-MAG-ZINC-D PO) Take 1 tablet by mouth every evening.    . Multiple Vitamin (MULTIVITAMIN) tablet Take 1 tablet by   mouth daily.    . furosemide (LASIX) 20 MG tablet Take 1 tablet (20 mg total) by mouth daily as needed for edema. 30 tablet 6   No current facility-administered medications for this visit.     Allergies: No Known Allergies  Social History: The patient  reports that she quit smoking about 40 years ago. She has never used smokeless tobacco. She reports that she does not drink alcohol or use drugs.   Family History: The patient's family history includes Cancer in her paternal aunt and sister; Coronary artery disease in her father and sister.   Review of Systems: Please see the history of present illness.   Otherwise, the review of systems is positive for none.   All other systems are reviewed and negative.   Physical Exam: VS:  BP (!) 148/100   Pulse 99   Ht  5' 4" (1.626 m)   Wt 150 lb 12.8 oz (68.4 kg)   BMI 25.88 kg/m  .  BMI Body mass index is 25.88 kg/m.  Wt Readings from Last 3 Encounters:  01/01/17 150 lb 12.8 oz (68.4 kg)  12/19/16 144 lb 12.8 oz (65.7 kg)  06/13/16 141 lb (64 kg)    General: Pleasant. Well developed, well nourished and in no acute distress.   HEENT: Normal.  Neck: Supple, no JVD, carotid bruits, or masses noted.  Cardiac: Irregular irregular rhythm. Rate fair. +1 edema.  Respiratory:  Lungs are clear to auscultation bilaterally with normal work of breathing.  GI: Soft and nontender.  MS: No deformity or atrophy. Gait and ROM intact.  Skin: Warm and dry. Color is normal.  Neuro:  Strength and sensation are intact and no gross focal deficits noted.  Psych: Alert, appropriate and with normal affect.   LABORATORY DATA:  EKG:  EKG is ordered today. This demonstrates atrial fib with VR of 99.  Lab Results  Component Value Date   WBC 6.7 11/08/2016   HGB 13.2 12/14/2015   HCT 40.0 11/08/2016   PLT 217 11/08/2016   GLUCOSE 86 12/19/2016   CHOL 197 08/22/2011   TRIG 82.0 08/22/2011   HDL 59.40 08/22/2011   LDLCALC 121 (H) 08/22/2011   ALT 15 08/22/2011   AST 22 08/22/2011   NA 142 12/19/2016   K 5.0 12/19/2016   CL 103 12/19/2016   CREATININE 0.83 12/19/2016   BUN 16 12/19/2016   CO2 25 12/19/2016   TSH 1.02 02/22/2015   HGBA1C 5.8 12/22/2009    BNP (last 3 results) No results for input(s): BNP in the last 8760 hours.  ProBNP (last 3 results) No results for input(s): PROBNP in the last 8760 hours.   Other Studies Reviewed Today:  Echo Study Conclusions 02/2015  - Left ventricle: The cavity size was normal. Wall thickness was normal. Systolic function was normal. The estimated ejection fraction was in the range of 55% to 60%.Wall motion was normal; there were no regional wall motion abnormalities. - Right ventricle: The cavity size was mildly dilated. - Right atrium: The atrium was  mildly dilated. - Atrial septum: No defect or patent foramen ovale was identified. - Tricuspid valve: There was moderate regurgitation.   Assessment/Plan:  1. PAF - prior cardioversion x 2 - last back in February of 2017. Discussed previously with Dr. Klein - to hold on AAD therapy for now. Toprol increased at last visit. She is ready to proceed with cardioversion - no availability until Tuesday, March 13th with Dr. Skains. Procedure reviewed and she is   willing to proceed.   2. Chronic anticoagulation with Eliquis - no missed doses. She will continue. No falls noted.   3. Probable diastolic HF - her weight is up - she has more swelling on exam - adding low dose Lasix today. Needs to restrict her salt.   Current medicines are reviewed with the patient today.  The patient does not have concerns regarding medicines other than what has been noted above.  The following changes have been made:  See above.  Labs/ tests ordered today include:    Orders Placed This Encounter  Procedures  . Basic metabolic panel  . CBC  . Protime-INR  . APTT  . EKG 12-Lead      Disposition:   FU with me in 3 weeks.   Patient is agreeable to this plan and will call if any problems develop in the interim.   Signed: Mayley Lish, NP  01/01/2017 4:15 PM  [facility] [address] , [place]  [address] Phone: [phone] Fax: [phone]        

## 2017-01-01 NOTE — Patient Instructions (Addendum)
We will be checking the following labs today - BMET, CBC, PT, PTT   Medication Instructions:    Continue with your current medicines. BUT  I am adding Lasix 20 mg to take as needed for swelling - do use for the next few days - I sent this to the drug store.     Testing/Procedures To Be Arranged:  Cardioversion  Follow-Up:   See me in about 3 weeks with EKG    Other Special Instructions:   Try to cut back on your salt  Your provider has recommended a cardioversion.   You are scheduled for a cardioversion on Tuesday, March 13th at [facility] with Dr. Marlou Porch or associates. Please go to [facility] [place] Stay at Tuesday, March 13th at 12:30PM.  Enter through the [place] not have any food or drink after midnight on Monday.  You may take your medicines with a sip of water on the day of your procedure.  You will need someone to drive you home following your procedure.   Call the [place] office at [phone] if you have any questions, problems or concerns.     Electrical Cardioversion Electrical cardioversion is the delivery of a jolt of electricity to change the rhythm of the heart. Sticky patches or metal paddles are placed on the chest to deliver the electricity from a device. This is done to restore a normal rhythm. A rhythm that is too fast or not regular keeps the heart from pumping well. Electrical cardioversion is done in an emergency if:  There is low or no blood pressure as a result of the heart rhythm.  Normal rhythm must be restored as fast as possible to protect the brain and heart from further damage.  It may save a life. Cardioversion may be done for heart rhythms that are not immediately life threatening, such as atrial fibrillation or flutter, in which:  The heart is beating too fast or is not regular.  Medicine to change the rhythm has not worked.  It is safe to wait in order to allow time for  preparation. Symptoms of the abnormal rhythm are bothersome. The risk of stroke and other serious problems can be reduced.  LET [facility] CARE PROVIDER KNOW ABOUT:  Any allergies you have. All medicines you are taking, including vitamins, herbs, eye drops, creams, and over-the-counter medicines. Previous problems you or members of your family have had with the use of anesthetics.  Any blood disorders you have.  Previous surgeries you have had.  Medical conditions you have.   RISKS AND COMPLICATIONS  Generally, this is a safe procedure. However, problems can occur and include:  Breathing problems related to the anesthetic used. A blood clot that breaks free and travels to other parts of your body. This could cause a stroke or other problems. The risk of this is lowered by use of blood-thinning medicine (anticoagulant) prior to the procedure. Cardiac arrest (rare).   BEFORE THE PROCEDURE  You may have tests to detect blood clots in your heart and to evaluate heart function. You may start taking anticoagulants so your blood does not clot as easily.  Medicines may be given to help stabilize your heart rate and rhythm.   PROCEDURE You will be given medicine through an IV tube to reduce discomfort and make you sleepy (sedative).  An electrical shock will be delivered.   AFTER THE PROCEDURE Your heart rhythm will be watched to make  sure it does not change. You will need someone to drive you home.         If you need a refill on your cardiac medications before your next appointment, please call your pharmacy.   Call the [place] office at [phone] if you have any questions, problems or concerns.

## 2017-01-02 LAB — BASIC METABOLIC PANEL
BUN/Creatinine Ratio: 17 (ref 12–28)
BUN: 15 mg/dL (ref 8–27)
CO2: 24 mmol/L (ref 18–29)
Calcium: 9 mg/dL (ref 8.7–10.3)
Chloride: 104 mmol/L (ref 96–106)
Creatinine, Ser: 0.86 mg/dL (ref 0.57–1.00)
GFR calc Af Amer: 72 mL/min/{1.73_m2} (ref >59)
GFR calc non Af Amer: 63 mL/min/{1.73_m2} (ref >59)
Glucose: 92 mg/dL (ref 65–99)
Potassium: 4.8 mmol/L (ref 3.5–5.2)
Sodium: 143 mmol/L (ref 134–144)

## 2017-01-02 LAB — PROTIME-INR
INR: 1 (ref 0.8–1.2)
Prothrombin Time: 10.9 s (ref 9.1–12.0)

## 2017-01-02 LAB — CBC
Hematocrit: 41 % (ref 34.0–46.6)
Hemoglobin: 13.6 g/dL (ref 11.1–15.9)
MCH: 29.8 pg (ref 26.6–33.0)
MCHC: 33.2 g/dL (ref 31.5–35.7)
MCV: 90 fL (ref 79–97)
Platelets: 228 10*3/uL (ref 150–379)
RBC: 4.57 x10E6/uL (ref 3.77–5.28)
RDW: 14 % (ref 12.3–15.4)
WBC: 5.7 10*3/uL (ref 3.4–10.8)

## 2017-01-02 LAB — APTT: aPTT: 27 s (ref 24–33)

## 2017-01-09 ENCOUNTER — Ambulatory Visit (HOSPITAL_COMMUNITY): Payer: Medicare PPO | Admitting: Anesthesiology

## 2017-01-09 ENCOUNTER — Ambulatory Visit (HOSPITAL_COMMUNITY)
Admission: RE | Admit: 2017-01-09 | Discharge: 2017-01-09 | Disposition: A | Discharge status: Home / Self Care | Payer: Medicare PPO | Source: Ambulatory Visit | Attending: Cardiology | Admitting: Cardiology

## 2017-01-09 ENCOUNTER — Encounter (HOSPITAL_COMMUNITY): Payer: Self-pay | Admitting: *Deleted

## 2017-01-09 ENCOUNTER — Encounter: Payer: Self-pay | Admitting: Nurse Practitioner

## 2017-01-09 ENCOUNTER — Encounter (HOSPITAL_COMMUNITY)
Admission: RE | Disposition: A | Discharge status: Home / Self Care | Payer: Self-pay | Source: Ambulatory Visit | Attending: Cardiology

## 2017-01-09 DIAGNOSIS — I481 Persistent atrial fibrillation: Secondary | ICD-10-CM | POA: Insufficient documentation

## 2017-01-09 DIAGNOSIS — I10 Essential (primary) hypertension: Secondary | ICD-10-CM | POA: Diagnosis not present

## 2017-01-09 DIAGNOSIS — Z7901 Long term (current) use of anticoagulants: Secondary | ICD-10-CM | POA: Insufficient documentation

## 2017-01-09 DIAGNOSIS — Z87891 Personal history of nicotine dependence: Secondary | ICD-10-CM | POA: Diagnosis not present

## 2017-01-09 DIAGNOSIS — Z8249 Family history of ischemic heart disease and other diseases of the circulatory system: Secondary | ICD-10-CM | POA: Insufficient documentation

## 2017-01-09 DIAGNOSIS — I480 Paroxysmal atrial fibrillation: Secondary | ICD-10-CM | POA: Diagnosis not present

## 2017-01-09 DIAGNOSIS — Z79899 Other long term (current) drug therapy: Secondary | ICD-10-CM | POA: Insufficient documentation

## 2017-01-09 DIAGNOSIS — I4891 Unspecified atrial fibrillation: Secondary | ICD-10-CM | POA: Diagnosis present

## 2017-01-09 HISTORY — PX: CARDIOVERSION: SHX1299

## 2017-01-09 SURGERY — CARDIOVERSION
Anesthesia: General

## 2017-01-09 MED ORDER — PROPOFOL 10 MG/ML IV BOLUS
INTRAVENOUS | Status: DC | PRN
  Administered 2017-01-09: 50 mg via INTRAVENOUS

## 2017-01-09 MED ORDER — LIDOCAINE HCL (CARDIAC) 20 MG/ML IV SOLN
INTRAVENOUS | Status: DC | PRN
  Administered 2017-01-09: 40 mg via INTRAVENOUS

## 2017-01-09 MED ORDER — SODIUM CHLORIDE 0.9 % IV SOLN
INTRAVENOUS | Status: DC
  Administered 2017-01-09: 13:00:00 via INTRAVENOUS

## 2017-01-09 NOTE — Transfer of Care (Signed)
Immediate Anesthesia Transfer of Care Note  Patient: Molly Tucker  Procedure(s) Performed: Procedure(s): CARDIOVERSION (N/A)  Patient Location: Endoscopy Unit  Anesthesia Type:General  Level of Consciousness: awake, alert , oriented and patient cooperative  Airway & Oxygen Therapy: Patient Spontanous Breathing and Patient connected to nasal cannula oxygen  Post-op Assessment: Report given to RN and Post -op Vital signs reviewed and stable  Post vital signs: Reviewed and stable  Last Vitals:  Vitals:   01/09/17 1308  BP: (!) 153/66  Pulse: 100  Resp: 17  Temp: 36.4 C    Last Pain:  Vitals:   01/09/17 1308  TempSrc: Oral         Complications: No apparent anesthesia complications

## 2017-01-09 NOTE — Interval H&P Note (Signed)
History and Physical Interval Note:  01/09/2017 1:21 PM  Molly Tucker  has presented today for surgery, with the diagnosis of AFIB  The various methods of treatment have been discussed with the patient and family. After consideration of risks, benefits and other options for treatment, the patient has consented to  Procedure(s): CARDIOVERSION (N/A) as a surgical intervention .  The patient's history has been reviewed, patient examined, no change in status, stable for surgery.  I have reviewed the patient's chart and labs.  Questions were answered to the patient's satisfaction.     [employer]

## 2017-01-09 NOTE — CV Procedure (Signed)
    Electrical Cardioversion Procedure Note Molly Tucker [id number] 08/10/1932  Procedure: Electrical Cardioversion Indications:  Atrial Fibrillation  Time Out: Verified patient identification, verified procedure,medications/allergies/relevent history reviewed, required imaging and test results available.  Performed  Procedure Details  The patient was NPO after midnight. Anesthesia was administered at the beside  by Dr. Ola Spurr with propofol.  Cardioversion was performed with synchronized biphasic defibrillation via AP pads with 120 joules.  1 attempt(s) were performed.  The patient converted to normal sinus rhythm. The patient tolerated the procedure well   IMPRESSION:  Successful cardioversion of atrial fibrillation    Molly Tucker 01/09/2017, 1:36 PM

## 2017-01-09 NOTE — Anesthesia Preprocedure Evaluation (Addendum)
Anesthesia Evaluation  Patient identified by MRN, date of birth, ID band Patient awake    Reviewed: Allergy & Precautions, H&P , NPO status , Patient's Chart, lab work & pertinent test results, reviewed documented beta blocker date and time   Airway Mallampati: II  TM Distance: >3 FB Neck ROM: Full    Dental no notable dental hx. (+) Teeth Intact, Dental Advisory Given   Pulmonary neg pulmonary ROS, former smoker,    Pulmonary exam normal breath sounds clear to auscultation       Cardiovascular hypertension, Pt. on medications and Pt. on home beta blockers + dysrhythmias Atrial Fibrillation  Rhythm:Irregular Rate:Normal     Neuro/Psych negative neurological ROS  negative psych ROS   GI/Hepatic negative GI ROS, Neg liver ROS,   Endo/Other  negative endocrine ROS  Renal/GU negative Renal ROS  negative genitourinary   Musculoskeletal  (+) Arthritis , Osteoarthritis,    Abdominal   Peds  Hematology negative hematology ROS (+)   Anesthesia Other Findings   Reproductive/Obstetrics negative OB ROS                            Anesthesia Physical Anesthesia Plan  ASA: III  Anesthesia Plan: General   Post-op Pain Management:    Induction: Intravenous  Airway Management Planned: Mask  Additional Equipment:   Intra-op Plan:   Post-operative Plan:   Informed Consent: I have reviewed the patients History and Physical, chart, labs and discussed the procedure including the risks, benefits and alternatives for the proposed anesthesia with the patient or authorized representative who has indicated his/her understanding and acceptance.   Dental advisory given  Plan Discussed with: CRNA  Anesthesia Plan Comments:         Anesthesia Quick Evaluation

## 2017-01-09 NOTE — Anesthesia Postprocedure Evaluation (Signed)
Anesthesia Post Note  Patient: Molly Tucker  Procedure(s) Performed: Procedure(s) (LRB): CARDIOVERSION (N/A)  Patient location during evaluation: PACU Anesthesia Type: General Level of consciousness: awake and alert Pain management: pain level controlled Vital Signs Assessment: post-procedure vital signs reviewed and stable Respiratory status: spontaneous breathing, nonlabored ventilation and respiratory function stable Cardiovascular status: blood pressure returned to baseline and stable Postop Assessment: no signs of nausea or vomiting Anesthetic complications: no       Last Vitals:  Vitals:   01/09/17 1355 01/09/17 1400  BP: (!) 98/50 131/60  Pulse: 67 (!) 57  Resp: 18 (!) 22  Temp:      Last Pain:  Vitals:   01/09/17 1340  TempSrc: Oral                 Molly Tucker,W. EDMOND

## 2017-01-09 NOTE — H&P (View-Only) (Signed)
CARDIOLOGY OFFICE NOTE  Date:  01/01/2017    Molly Tucker Date of Birth: [birth year] Medical Record #[id number]  PCP:  Binnie Rail, MD  Cardiologist:  Ree Shay  Chief Complaint  Patient presents with  . Atrial Fibrillation    Follow up visit - seen for [name]    History of Present Illness: Molly Tucker is a 83 y.o. female who presents today for a follow up visit. Seen for [name].  She has a history of PAF, HTN, HLD, and osteoporosis. She has had prior cardioversions back to NSR - with improvement in symptoms. Cardioverted in June of 2016 and again in February of 2017.   I last saw her back in August - was doing well.   Called towards the end of last month - felt to be back in AF. Asked to come back in so we could get an EKG. She was back in AF by EKG. Lots of stress at home - her sister (who usually sits with Molly Tucker) was out of town. Son was having a carotid and could not drive for a month so she will be picking up grandkids, etc. Her life was pretty hectic until after March 1st. Discussed with [name] - we opted to increase her Toprol and hold on AAD therapy for now.   Comes in today. Here alone. She is tired. She has more swelling. Weight is up. She admits she is getting too much salt - her and Molly Tucker eat out most meals - she had country ham yesterday. She wants to now proceed with cardioversion. Sister now available to provide her transportation. Son got thru his carotid surgery.  No missed doses of eliquis.   Past Medical History:  Diagnosis Date  . Atrial tachycardia ([place])   . Carotid bruit 2005   right, carotid doppler: <39% occlusion  . Hyperlipidemia    LDL goal =<120  . Hyperplastic colonic polyp 2006   Dr. Carlean Purl, due 2016  . Hypertension   . Osteoporosis    S/P  biphosphonates x 5 yrs  . Persistent atrial fibrillation [facility])     Past Surgical History:  Procedure Laterality Date  . CARDIOVERSION N/A 03/31/2015   Procedure:  CARDIOVERSION;  Surgeon: Sanda Klein, MD;  Location: [facility];  Service: Cardiovascular;  Laterality: N/A;  . CARDIOVERSION N/A 12/21/2015   Procedure: CARDIOVERSION;  Surgeon: Lelon Perla, MD;  Location: [place];  Service: Cardiovascular;  Laterality: N/A;  . COLONOSCOPY W/ POLYPECTOMY  2006  . DILATION AND CURETTAGE OF UTERUS    . MOUTH SURGERY  2009-2010   implants, Dr. Marcelyn Ditty     Medications: Current Outpatient Prescriptions  Medication Sig Dispense Refill  . acetaminophen (TYLENOL) 500 MG tablet Take 500 mg by mouth 2 (two) times daily as needed (arthritis pain).    Marland Kitchen ELIQUIS 5 MG TABS tablet TAKE 1 TABLET (5 MG TOTAL) BY MOUTH 2 (TWO) TIMES DAILY. 180 tablet 3  . metoprolol succinate (TOPROL XL) 25 MG 24 hr tablet Take 1 tablet (25 mg total) by mouth daily. 25 mg in the AM and 50 mg in the PM 30 tablet 6  . metoprolol succinate (TOPROL XL) 50 MG 24 hr tablet Take 1 tablet (50 mg total) by mouth daily. Take with or immediately following a meal. 90 tablet 3  . Multiple Minerals-Vitamins (CAL-MAG-ZINC-D PO) Take 1 tablet by mouth every evening.    . Multiple Vitamin (MULTIVITAMIN) tablet Take 1 tablet by  mouth daily.    . furosemide (LASIX) 20 MG tablet Take 1 tablet (20 mg total) by mouth daily as needed for edema. 30 tablet 6   No current facility-administered medications for this visit.     Allergies: No Known Allergies  Social History: The patient  reports that she quit smoking about 40 years ago. She has never used smokeless tobacco. She reports that she does not drink alcohol or use drugs.   Family History: The patient's family history includes Cancer in her paternal aunt and sister; Coronary artery disease in her father and sister.   Review of Systems: Please see the history of present illness.   Otherwise, the review of systems is positive for none.   All other systems are reviewed and negative.   Physical Exam: VS:  BP (!) 148/100   Pulse 99   Ht  5\' 4"  (1.626 m)   Wt 150 lb 12.8 oz (68.4 kg)   BMI 25.88 kg/m  .  BMI Body mass index is 25.88 kg/m.  Wt Readings from Last 3 Encounters:  01/01/17 150 lb 12.8 oz (68.4 kg)  12/19/16 144 lb 12.8 oz (65.7 kg)  06/13/16 141 lb (64 kg)    General: Pleasant. Well developed, well nourished and in no acute distress.   HEENT: Normal.  Neck: Supple, no JVD, carotid bruits, or masses noted.  Cardiac: Irregular irregular rhythm. Rate fair. +1 edema.  Respiratory:  Lungs are clear to auscultation bilaterally with normal work of breathing.  GI: Soft and nontender.  MS: No deformity or atrophy. Gait and ROM intact.  Skin: Warm and dry. Color is normal.  Neuro:  Strength and sensation are intact and no gross focal deficits noted.  Psych: Alert, appropriate and with normal affect.   LABORATORY DATA:  EKG:  EKG is ordered today. This demonstrates atrial fib with VR of 99.  Lab Results  Component Value Date   WBC 6.7 11/08/2016   HGB 13.2 12/14/2015   HCT 40.0 11/08/2016   PLT 217 11/08/2016   GLUCOSE 86 12/19/2016   CHOL 197 08/22/2011   TRIG 82.0 08/22/2011   HDL 59.40 08/22/2011   LDLCALC 121 (H) 08/22/2011   ALT 15 08/22/2011   AST 22 08/22/2011   NA 142 12/19/2016   K 5.0 12/19/2016   CL 103 12/19/2016   CREATININE 0.83 12/19/2016   BUN 16 12/19/2016   CO2 25 12/19/2016   TSH 1.02 02/22/2015   HGBA1C 5.8 12/22/2009    BNP (last 3 results) No results for input(s): BNP in the last 8760 hours.  ProBNP (last 3 results) No results for input(s): PROBNP in the last 8760 hours.   Other Studies Reviewed Today:  Echo Study Conclusions 02/2015  - Left ventricle: The cavity size was normal. Wall thickness was normal. Systolic function was normal. The estimated ejection fraction was in the range of 55% to 60%.Wall motion was normal; there were no regional wall motion abnormalities. - Right ventricle: The cavity size was mildly dilated. - Right atrium: The atrium was  mildly dilated. - Atrial septum: No defect or patent foramen ovale was identified. - Tricuspid valve: There was moderate regurgitation.   Assessment/Plan:  1. PAF - prior cardioversion x 2 - last back in February of 2017. Discussed previously with [name] - to hold on AAD therapy for now. Toprol increased at last visit. She is ready to proceed with cardioversion - no availability until Tuesday, March 13th with Dr. Marlou Porch. Procedure reviewed and she is  willing to proceed.   2. Chronic anticoagulation with Eliquis - no missed doses. She will continue. No falls noted.   3. Probable diastolic HF - her weight is up - she has more swelling on exam - adding low dose Lasix today. Needs to restrict her salt.   Current medicines are reviewed with the patient today.  The patient does not have concerns regarding medicines other than what has been noted above.  The following changes have been made:  See above.  Labs/ tests ordered today include:    Orders Placed This Encounter  Procedures  . Basic metabolic panel  . CBC  . Protime-INR  . APTT  . EKG 12-Lead      Disposition:   FU with me in 3 weeks.   Patient is agreeable to this plan and will call if any problems develop in the interim.   SignedTruitt Merle, NP  01/01/2017 4:15 PM  [facility] [address] [place], [place]  [address] Phone: [phone] Fax: [phone]

## 2017-01-09 NOTE — Discharge Instructions (Signed)
Electrical Cardioversion, Care After °This sheet gives you information about how to care for yourself after your procedure. Your health care provider may also give you more specific instructions. If you have problems or questions, contact your health care provider. °What can I expect after the procedure? °After the procedure, it is common to have: °· Some redness on the skin where the shocks were given. °Follow these instructions at home: °· Do not drive for 24 hours if you were given a medicine to help you relax (sedative). °· Take over-the-counter and prescription medicines only as told by your health care provider. °· Ask your health care provider how to check your pulse. Check it often. °· Rest for 48 hours after the procedure or as told by your health care provider. °· Avoid or limit your caffeine use as told by your health care provider. °Contact a health care provider if: °· You feel like your heart is beating too quickly or your pulse is not regular. °· You have a serious muscle cramp that does not go away. °Get help right away if: °· You have discomfort in your chest. °· You are dizzy or you feel faint. °· You have trouble breathing or you are short of breath. °· Your speech is slurred. °· You have trouble moving an arm or leg on one side of your body. °· Your fingers or toes turn cold or blue. °This information is not intended to replace advice given to you by your health care provider. Make sure you discuss any questions you have with your health care provider. °Document Released: 08/06/2013 Document Revised: 05/19/2016 Document Reviewed: 04/21/2016 °Elsevier Interactive Patient Education © 2017 Elsevier Inc. ° °

## 2017-01-10 ENCOUNTER — Encounter (HOSPITAL_COMMUNITY): Payer: Self-pay | Admitting: Cardiology

## 2017-01-10 ENCOUNTER — Other Ambulatory Visit: Payer: Self-pay | Admitting: Nurse Practitioner

## 2017-01-10 MED ORDER — METOPROLOL SUCCINATE ER 25 MG PO TB24: 25.0000 mg | ORAL_TABLET | Freq: Every day | ORAL | 6 refills | Status: DC

## 2017-01-11 ENCOUNTER — Ambulatory Visit (HOSPITAL_COMMUNITY)
Admission: RE | Admit: 2017-01-11 | Discharge: 2017-01-11 | Disposition: A | Discharge status: Home / Self Care | Payer: Medicare PPO | Source: Ambulatory Visit | Attending: Nurse Practitioner | Admitting: Nurse Practitioner

## 2017-01-11 ENCOUNTER — Encounter (HOSPITAL_COMMUNITY): Payer: Self-pay | Admitting: Nurse Practitioner

## 2017-01-11 ENCOUNTER — Telehealth: Payer: Self-pay

## 2017-01-11 ENCOUNTER — Encounter: Payer: Self-pay | Admitting: Nurse Practitioner

## 2017-01-11 VITALS — BP 122/74 | HR 71 | Ht 64.0 in | Wt 153.8 lb

## 2017-01-11 DIAGNOSIS — Z87891 Personal history of nicotine dependence: Secondary | ICD-10-CM | POA: Insufficient documentation

## 2017-01-11 DIAGNOSIS — I480 Paroxysmal atrial fibrillation: Secondary | ICD-10-CM | POA: Insufficient documentation

## 2017-01-11 DIAGNOSIS — Z86010 Personal history of colonic polyps: Secondary | ICD-10-CM | POA: Diagnosis not present

## 2017-01-11 DIAGNOSIS — E785 Hyperlipidemia, unspecified: Secondary | ICD-10-CM | POA: Diagnosis not present

## 2017-01-11 DIAGNOSIS — M810 Age-related osteoporosis without current pathological fracture: Secondary | ICD-10-CM | POA: Diagnosis not present

## 2017-01-11 DIAGNOSIS — I10 Essential (primary) hypertension: Secondary | ICD-10-CM | POA: Insufficient documentation

## 2017-01-11 NOTE — Telephone Encounter (Signed)
Molly Tucker  to Burtis Junes, NP       01/11/17 11:13 AM  Marykay Lex [name] - I'm pretty sure I am back in A-fib - still have the same symptoms, shortness of breath with exertion, tired, etc. Had edema in ankles last night (down this morning) Really don't feel any better. Should I come by for an EKG to make sure?  thanks    Calling patient about her message. Left message for patient to call back. A. FIB clinic has a couple of openings this afternoon. Will see if patient can come in today to be evaluated.

## 2017-01-11 NOTE — Progress Notes (Signed)
Primary Care Physician: Binnie Rail, MD Referring Physician: [facility]   Molly Tucker is a 83 y.o. female with a h/o paroxysmal afib that is in the afib clinic. She had a cardioversion 3/13 and asked to be seen for feeling that she was back in afib due to feeling winded and fatigued. She is in NR today with pac's, which are chronic for pt. Her weight is up 3 lbs since last visit and ankles are visibly swollen. She takes lasix as needed but has to take every 3 days on a fairly regular basis. She feels that her baseline weight at home is around 145. Extra metoprolol was added prior to last cardioversion.Continues on eliquis 5 mg bid.  Today, she denies symptoms of palpitations, chest pain, shortness of breath, orthopnea, PND, lower extremity edema, dizziness, presyncope, syncope, or neurologic sequela. The patient is tolerating medications without difficulties and is otherwise without complaint today.   Past Medical History:  Diagnosis Date  . Atrial tachycardia ([place])   . Carotid bruit 2005   right, carotid doppler: <39% occlusion  . Hyperlipidemia    LDL goal =<120  . Hyperplastic colonic polyp 2006   Dr. Carlean Purl, due 2016  . Hypertension   . Osteoporosis    S/P  biphosphonates x 5 yrs  . Persistent atrial fibrillation [facility])    Past Surgical History:  Procedure Laterality Date  . CARDIOVERSION N/A 03/31/2015   Procedure: CARDIOVERSION;  Surgeon: Sanda Klein, MD;  Location: [facility];  Service: Cardiovascular;  Laterality: N/A;  . CARDIOVERSION N/A 12/21/2015   Procedure: CARDIOVERSION;  Surgeon: Lelon Perla, MD;  Location: [facility];  Service: Cardiovascular;  Laterality: N/A;  . CARDIOVERSION N/A 01/09/2017   Procedure: CARDIOVERSION;  Surgeon: Jerline Pain, MD;  Location: [facility];  Service: Cardiovascular;  Laterality: N/A;  . COLONOSCOPY W/ POLYPECTOMY  2006  . DILATION AND CURETTAGE OF UTERUS    . MOUTH SURGERY  2009-2010   implants, Dr. Marcelyn Ditty     Current Outpatient Prescriptions  Medication Sig Dispense Refill  . acetaminophen (TYLENOL) 500 MG tablet Take 500 mg by mouth 2 (two) times daily as needed (arthritis pain).    Marland Kitchen ELIQUIS 5 MG TABS tablet TAKE 1 TABLET (5 MG TOTAL) BY MOUTH 2 (TWO) TIMES DAILY. 180 tablet 3  . furosemide (LASIX) 20 MG tablet Take 1 tablet (20 mg total) by mouth daily as needed for edema. 30 tablet 6  . metoprolol succinate (TOPROL XL) 50 MG 24 hr tablet Take 1 tablet (50 mg total) by mouth daily. Take with or immediately following a meal. 90 tablet 3  . Multiple Minerals-Vitamins (CAL-MAG-ZINC-D PO) Take 1 tablet by mouth every evening.    . Multiple Vitamin (MULTIVITAMIN) tablet Take 1 tablet by mouth daily.     No current facility-administered medications for this encounter.     No Known Allergies  Social History   Social History  . Marital status: Married    Spouse name: N/A  . Number of children: N/A  . Years of education: N/A   Occupational History  . Retired [employer]   Social History Main Topics  . Smoking status: Former Smoker    Quit date: 10/30/1976  . Smokeless tobacco: Never Used     Comment: Patient would ONLY smoke occasionally   . Alcohol use No  . Drug use: No  . Sexual activity: Not on file   Other Topics Concern  . Not on file   Social History  Narrative   Regular exercise: yes: walking 30 minute once daily    Family History  Problem Relation Age of Onset  . Coronary artery disease Father     MI in 77s  . Cancer Sister     breast  . Coronary artery disease Sister     stent 2009  . Cancer Paternal Aunt     colon, possible breast cancer  . Arthritis      aunts  . Stroke Neg Hx     ROS- All systems are reviewed and negative except as per the HPI above  Physical Exam: Vitals:   01/11/17 1438  BP: 122/74  Pulse: 71  Weight: 153 lb 12.8 oz (69.8 kg)  Height: 5\' 4"  (1.626 m)   Wt Readings from Last 3 Encounters:  01/11/17 153 lb 12.8 oz (69.8 kg)   01/09/17 150 lb (68 kg)  01/01/17 150 lb 12.8 oz (68.4 kg)    Labs: Lab Results  Component Value Date   NA 143 01/01/2017   K 4.8 01/01/2017   CL 104 01/01/2017   CO2 24 01/01/2017   GLUCOSE 92 01/01/2017   BUN 15 01/01/2017   CREATININE 0.86 01/01/2017   CALCIUM 9.0 01/01/2017   MG 2.2 02/22/2015   Lab Results  Component Value Date   INR 1.0 01/01/2017   Lab Results  Component Value Date   CHOL 197 08/22/2011   HDL 59.40 08/22/2011   LDLCALC 121 (H) 08/22/2011   TRIG 82.0 08/22/2011     GEN- The patient is well appearing, alert and oriented x 3 today.   Head- normocephalic, atraumatic Eyes-  Sclera clear, conjunctiva pink Ears- hearing intact Oropharynx- clear Neck- supple, no JVP Lymph- no cervical lymphadenopathy Lungs- Clear to ausculation bilaterally, normal work of breathing Heart- Mildly irregular rate and rhythm,(pac's) no murmurs, rubs or gallops, PMI not laterally displaced GI- soft, NT, ND, + BS Extremities- no clubbing, cyanosis, or + for edema 2+, more so around ankles MS- no significant deformity or atrophy Skin- no rash or lesion Psych- euthymic mood, full affect Neuro- strength and sensation are intact  EKG-Sr with bigeminal PAC's with pr int 160 ms, qrs int 76 ms, qtc 417 ms Epic records reviewed    Assessment and Plan: 1. Paroxysmal afib Back in SR but  does not feel as well as usual after cardioversion in SR May be slightly fluid overloaded- lasix 20 mg qd x 3 days then qod until back to baseline weight or until she sees [name] f/u 3/28  Fatigue could be from extra metoprolol,  try to go back to 50 mg at hs and stop the 25 mg am dose If returns to afib within a short period of time antiarrythmic may need to be considered Continue eliquis 5 mg bid   F/u [name], NP 3/28 afib clinic as needed  Butch Penny C. Parley Pidcock, [facility] [address] [place], [place] [address] [phone]

## 2017-01-11 NOTE — Patient Instructions (Signed)
Your physician has recommended you make the following change in your medication:  1)Take 20mg  of Lasix for the next 3 days then monitor weights daily 2) 50mg  in evening of metoprolol only -- stop the 25mg  in the morning

## 2017-01-11 NOTE — Telephone Encounter (Signed)
Called patient again about scheduling appointment. Patient was agreeable to appointment time today 2:30.

## 2017-01-12 NOTE — Progress Notes (Signed)
Thanks so much!  Maritssa Haughton 

## 2017-01-23 NOTE — Progress Notes (Signed)
CARDIOLOGY OFFICE NOTE  Date:  01/24/2017    Molly Tucker Date of Birth: 1931/12/20 Medical Record #[id number]  PCP:  Binnie Rail, MD  Cardiologist:  Ree Shay    Chief Complaint  Patient presents with  . Atrial Fibrillation    Follow up visit - seen for Dr. Caryl Tucker    History of Present Illness: Molly Tucker is a 83 y.o. female who presents today for a follow up visit. Seen for Dr. Caryl Tucker.  She has a history of PAF, HTN, HLD, and osteoporosis. She has had prior cardioversions back to NSR - with improvement in symptoms. Cardioverted in June of 2016, February of 2017 and lastly in March of 2018.   I last saw her back in August - was doing well.   Called towards the end of last month - felt to be back in AF. Asked to come back in so we could get an EKG. She was back in AF by EKG. Lots of stress at home - her sister (who usually sits with [name]) was out of town. Son was having a carotid and could not drive for a month so she will be picking up grandkids, etc. Her life was pretty hectic until after March 1st. Discussed with Dr. Caryl Tucker - we opted to increase her Toprol and hold on AAD therapy for now. We ended up referring her on for cardioversion which was successful at restoring NSR.   Then seen in the AF clinic - she had sent a My Chart message - thought she was back in AF but she was not. Some fatigue. Medicines adjusted.   Tucker in today. Here alone. She is doing well. Remains in rhythm. She is happy with how she is doing. She is back working in the yard. Her swelling has resolved. She used the Lasix for about a week with good results. Energy level is good. BP is better at home.   Past Medical History:  Diagnosis Date  . Atrial tachycardia ([place])   . Carotid bruit 2005   right, carotid doppler: <39% occlusion  . Hyperlipidemia    LDL goal =<120  . Hyperplastic colonic polyp 2006   Dr. Carlean Purl, due 2016  . Hypertension   . Osteoporosis    S/P  biphosphonates  x 5 yrs  . Persistent atrial fibrillation [facility])     Past Surgical History:  Procedure Laterality Date  . CARDIOVERSION N/A 03/31/2015   Procedure: CARDIOVERSION;  Surgeon: Sanda Klein, MD;  Location: [facility];  Service: Cardiovascular;  Laterality: N/A;  . CARDIOVERSION N/A 12/21/2015   Procedure: CARDIOVERSION;  Surgeon: Lelon Perla, MD;  Location: [facility] ENDOSCOPY;  Service: Cardiovascular;  Laterality: N/A;  . CARDIOVERSION N/A 01/09/2017   Procedure: CARDIOVERSION;  Surgeon: Jerline Pain, MD;  Location: [place];  Service: Cardiovascular;  Laterality: N/A;  . COLONOSCOPY W/ POLYPECTOMY  2006  . DILATION AND CURETTAGE OF UTERUS    . MOUTH SURGERY  2009-2010   implants, Dr. Marcelyn Ditty     Medications: Current Outpatient Prescriptions  Medication Sig Dispense Refill  . acetaminophen (TYLENOL) 500 MG tablet Take 500 mg by mouth 2 (two) times daily as needed (arthritis pain).    Marland Kitchen ELIQUIS 5 MG TABS tablet TAKE 1 TABLET (5 MG TOTAL) BY MOUTH 2 (TWO) TIMES DAILY. 180 tablet 3  . furosemide (LASIX) 20 MG tablet Take 1 tablet (20 mg total) by mouth daily as needed for edema. 30 tablet 6  . metoprolol  succinate (TOPROL XL) 50 MG 24 hr tablet Take 1 tablet (50 mg total) by mouth daily. Take with or immediately following a meal. 90 tablet 3  . Multiple Minerals-Vitamins (CAL-MAG-ZINC-D PO) Take 1 tablet by mouth every evening.    . Multiple Vitamin (MULTIVITAMIN) tablet Take 1 tablet by mouth daily.     No current facility-administered medications for this visit.     Allergies: No Known Allergies  Social History: The patient  reports that she quit smoking about 40 years ago. She has never used smokeless tobacco. She reports that she does not drink alcohol or use drugs.   Family History: The patient's family history includes Cancer in her paternal aunt and sister; Coronary artery disease in her father and sister.   Review of Systems: Please see the history of present illness.    Otherwise, the review of systems is positive for none.   All other systems are reviewed and negative.   Physical Exam: VS:  BP (!) 160/80   Pulse 69   Ht 5\' 4"  (1.626 m)   Wt 141 lb 12.8 oz (64.3 kg)   BMI 24.34 kg/m  .  BMI Body mass index is 24.34 kg/m.  Wt Readings from Last 3 Encounters:  01/24/17 141 lb 12.8 oz (64.3 kg)  01/11/17 153 lb 12.8 oz (69.8 kg)  01/09/17 150 lb (68 kg)    General: Pleasant. Well developed, well nourished and in no acute distress.  Her weight is back down.  HEENT: Normal.  Neck: Supple, no JVD, carotid bruits, or masses noted.  Cardiac: Regular rate and rhythm. No murmurs, rubs, or gallops. No edema.  Respiratory:  Lungs are clear to auscultation bilaterally with normal work of breathing.  GI: Soft and nontender.  MS: No deformity or atrophy. Gait and ROM intact.  Skin: Warm and dry. Color is normal.  Neuro:  Strength and sensation are intact and no gross focal deficits noted.  Psych: Alert, appropriate and with normal affect.   LABORATORY DATA:  EKG:  EKG is ordered today. This demonstrates NSR.  Lab Results  Component Value Date   WBC 5.7 01/01/2017   HGB 13.2 12/14/2015   HCT 41.0 01/01/2017   PLT 228 01/01/2017   GLUCOSE 92 01/01/2017   CHOL 197 08/22/2011   TRIG 82.0 08/22/2011   HDL 59.40 08/22/2011   LDLCALC 121 (H) 08/22/2011   ALT 15 08/22/2011   AST 22 08/22/2011   NA 143 01/01/2017   K 4.8 01/01/2017   CL 104 01/01/2017   CREATININE 0.86 01/01/2017   BUN 15 01/01/2017   CO2 24 01/01/2017   TSH 1.02 02/22/2015   INR 1.0 01/01/2017   HGBA1C 5.8 12/22/2009    BNP (last 3 results) No results for input(s): BNP in the last 8760 hours.  ProBNP (last 3 results) No results for input(s): PROBNP in the last 8760 hours.   Other Studies Reviewed Today:  Echo Study Conclusions 02/2015  - Left ventricle: The cavity size was normal. Wall thickness was normal. Systolic function was normal. The estimated  ejection fraction was in the range of 55% to 60%.Wall motion was normal; there were no regional wall motion abnormalities. - Right ventricle: The cavity size was mildly dilated. - Right atrium: The atrium was mildly dilated. - Atrial septum: No defect or patent foramen ovale was identified. - Tricuspid valve: There was moderate regurgitation.   Assessment/Plan:  1. PAF - has now had cardioversion x 3 - most recently earlier this month. She  is doing well and remains in NSR. Discussed previously with Dr. Caryl Tucker - to hold on AAD therapy for now. I have left her on her current regimen.   2. Chronic anticoagulation with Eliquis - no missed doses. No problems noted. No falls noted.   3. Probable diastolic HF - in response to her AF. She was given prn Lasix to use. She had excellent response. Repeat BMET today.   Current medicines are reviewed with the patient today.  The patient does not have concerns regarding medicines other than what has been noted above.  The following changes have been made:  See above.  Labs/ tests ordered today include:    Orders Placed This Encounter  Procedures  . Basic metabolic panel  . EKG 12-Lead     Disposition:   FU with me in 6 months.   Patient is agreeable to this plan and will call if any problems develop in the interim.   SignedTruitt Merle, NP  01/24/2017 4:02 PM  [facility] [address] [place], [place]  [address] Phone: [phone] Fax: [phone]

## 2017-01-24 ENCOUNTER — Ambulatory Visit (INDEPENDENT_AMBULATORY_CARE_PROVIDER_SITE_OTHER): Payer: Medicare PPO | Admitting: Nurse Practitioner

## 2017-01-24 ENCOUNTER — Encounter: Payer: Self-pay | Admitting: Nurse Practitioner

## 2017-01-24 VITALS — BP 160/80 | HR 69 | Ht 64.0 in | Wt 141.8 lb

## 2017-01-24 DIAGNOSIS — I480 Paroxysmal atrial fibrillation: Secondary | ICD-10-CM

## 2017-01-24 DIAGNOSIS — Z9889 Other specified postprocedural states: Secondary | ICD-10-CM | POA: Diagnosis not present

## 2017-01-24 NOTE — Patient Instructions (Addendum)
We will be checking the following labs today - BMET  Medication Instructions:    Continue with your current medicines.     Testing/Procedures To Be Arranged:  N/A  Follow-Up:   See me in 6 months.     Other Special Instructions:   N/A    If you need a refill on your cardiac medications before your next appointment, please call your pharmacy.   Call the [place] office at [phone] if you have any questions, problems or concerns.

## 2017-01-25 LAB — BASIC METABOLIC PANEL
BUN/Creatinine Ratio: 22 (ref 12–28)
BUN: 17 mg/dL (ref 8–27)
CO2: 26 mmol/L (ref 18–29)
Calcium: 9.5 mg/dL (ref 8.7–10.3)
Chloride: 102 mmol/L (ref 96–106)
Creatinine, Ser: 0.76 mg/dL (ref 0.57–1.00)
GFR calc Af Amer: 84 mL/min/{1.73_m2} (ref >59)
GFR calc non Af Amer: 73 mL/min/{1.73_m2} (ref >59)
Glucose: 93 mg/dL (ref 65–99)
Potassium: 5 mmol/L (ref 3.5–5.2)
Sodium: 141 mmol/L (ref 134–144)

## 2017-02-04 ENCOUNTER — Encounter: Payer: Self-pay | Admitting: Nurse Practitioner

## 2017-03-07 ENCOUNTER — Ambulatory Visit: Payer: Medicare PPO | Admitting: Nurse Practitioner

## 2017-04-05 NOTE — Patient Instructions (Addendum)
Molly Tucker , Thank you for taking time to come for your Medicare Wellness Visit. I appreciate your ongoing commitment to your health goals. Please review the following plan we discussed and let me know if I can assist you in the future.   These are the goals we discussed: Goals    None      This is a list of the screening recommended for you and due dates:  Health Maintenance  Topic Date Due  . DEXA scan (bone density measurement)  12/25/1998  . Pneumonia vaccines (1 of 2 - PCV13) 10/30/2017*  . Flu Shot  05/30/2017  . Tetanus Vaccine  08/21/2021  *Topic was postponed. The date shown is not the original due date.     Test(s) ordered today. Your results will be released to [place] (or called to you) after review, usually within 72hours after test completion. If any changes need to be made, you will be notified at that same time.  All other Health Maintenance issues reviewed.   All recommended immunizations and age-appropriate screenings are up-to-date or discussed.  No immunizations administered today.   Medications reviewed and updated.  No changes recommended at this time.   Please followup in one year   Health Maintenance, Female Adopting a healthy lifestyle and getting preventive care can go a long way to promote health and wellness. Talk with your health care provider about what schedule of regular examinations is right for you. This is a good chance for you to check in with your provider about disease prevention and staying healthy. In between checkups, there are plenty of things you can do on your own. Experts have done a lot of research about which lifestyle changes and preventive measures are most likely to keep you healthy. Ask your health care provider for more information. Weight and diet Eat a healthy diet  Be sure to include plenty of vegetables, fruits, low-fat dairy products, and lean protein.  Do not eat a lot of foods high in solid fats, added sugars, or  salt.  Get regular exercise. This is one of the most important things you can do for your health. ? Most adults should exercise for at least 150 minutes each week. The exercise should increase your heart rate and make you sweat (moderate-intensity exercise). ? Most adults should also do strengthening exercises at least twice a week. This is in addition to the moderate-intensity exercise.  Maintain a healthy weight  Body mass index (BMI) is a measurement that can be used to identify possible weight problems. It estimates body fat based on height and weight. Your health care provider can help determine your BMI and help you achieve or maintain a healthy weight.  For females 48 years of age and older: ? A BMI below 18.5 is considered underweight. ? A BMI of 18.5 to 24.9 is normal. ? A BMI of 25 to 29.9 is considered overweight. ? A BMI of 30 and above is considered obese.  Watch levels of cholesterol and blood lipids  You should start having your blood tested for lipids and cholesterol at 81 years of age, then have this test every 5 years.  You may need to have your cholesterol levels checked more often if: ? Your lipid or cholesterol levels are high. ? You are older than 81 years of age. ? You are at high risk for heart disease.  Cancer screening Lung Cancer  Lung cancer screening is recommended for adults 80-68 years old who are at high  risk for lung cancer because of a history of smoking.  A yearly low-dose CT scan of the lungs is recommended for people who: ? Currently smoke. ? Have quit within the past 15 years. ? Have at least a 30-pack-year history of smoking. A pack year is smoking an average of one pack of cigarettes a day for 1 year.  Yearly screening should continue until it has been 15 years since you quit.  Yearly screening should stop if you develop a health problem that would prevent you from having lung cancer treatment.  Breast Cancer  Practice breast  self-awareness. This means understanding how your breasts normally appear and feel.  It also means doing regular breast self-exams. Let your health care provider know about any changes, no matter how small.  If you are in your 20s or 30s, you should have a clinical breast exam (CBE) by a health care provider every 1-3 years as part of a regular health exam.  If you are 74 or older, have a CBE every year. Also consider having a breast X-ray (mammogram) every year.  If you have a family history of breast cancer, talk to your health care provider about genetic screening.  If you are at high risk for breast cancer, talk to your health care provider about having an MRI and a mammogram every year.  Breast cancer gene (BRCA) assessment is recommended for women who have family members with BRCA-related cancers. BRCA-related cancers include: ? Breast. ? Ovarian. ? Tubal. ? Peritoneal cancers.  Results of the assessment will determine the need for genetic counseling and BRCA1 and BRCA2 testing.  Cervical Cancer Your health care provider may recommend that you be screened regularly for cancer of the pelvic organs (ovaries, uterus, and vagina). This screening involves a pelvic examination, including checking for microscopic changes to the surface of your cervix (Pap test). You may be encouraged to have this screening done every 3 years, beginning at age 5.  For women ages 41-65, health care providers may recommend pelvic exams and Pap testing every 3 years, or they may recommend the Pap and pelvic exam, combined with testing for human papilloma virus (HPV), every 5 years. Some types of HPV increase your risk of cervical cancer. Testing for HPV may also be done on women of any age with unclear Pap test results.  Other health care providers may not recommend any screening for nonpregnant women who are considered low risk for pelvic cancer and who do not have symptoms. Ask your health care provider if a  screening pelvic exam is right for you.  If you have had past treatment for cervical cancer or a condition that could lead to cancer, you need Pap tests and screening for cancer for at least 20 years after your treatment. If Pap tests have been discontinued, your risk factors (such as having a new sexual partner) need to be reassessed to determine if screening should resume. Some women have medical problems that increase the chance of getting cervical cancer. In these cases, your health care provider may recommend more frequent screening and Pap tests.  Colorectal Cancer  This type of cancer can be detected and often prevented.  Routine colorectal cancer screening usually begins at 81 years of age and continues through 81 years of age.  Your health care provider may recommend screening at an earlier age if you have risk factors for colon cancer.  Your health care provider may also recommend using home test kits to check for hidden  blood in the stool.  A small camera at the end of a tube can be used to examine your colon directly (sigmoidoscopy or colonoscopy). This is done to check for the earliest forms of colorectal cancer.  Routine screening usually begins at age 29.  Direct examination of the colon should be repeated every 5-10 years through 81 years of age. However, you may need to be screened more often if early forms of precancerous polyps or small growths are found.  Skin Cancer  Check your skin from head to toe regularly.  Tell your health care provider about any new moles or changes in moles, especially if there is a change in a mole's shape or color.  Also tell your health care provider if you have a mole that is larger than the size of a pencil eraser.  Always use sunscreen. Apply sunscreen liberally and repeatedly throughout the day.  Protect yourself by wearing long sleeves, pants, a wide-brimmed hat, and sunglasses whenever you are outside.  Heart disease, diabetes, and  high blood pressure  High blood pressure causes heart disease and increases the risk of stroke. High blood pressure is more likely to develop in: ? People who have blood pressure in the high end of the normal range (130-139/85-89 mm Hg). ? People who are overweight or obese. ? People who are African American.  If you are 69-84 years of age, have your blood pressure checked every 3-5 years. If you are 36 years of age or older, have your blood pressure checked every year. You should have your blood pressure measured twice-once when you are at a hospital or clinic, and once when you are not at a hospital or clinic. Record the average of the two measurements. To check your blood pressure when you are not at a hospital or clinic, you can use: ? An automated blood pressure machine at a pharmacy. ? A home blood pressure monitor.  If you are between 61 years and 105 years old, ask your health care provider if you should take aspirin to prevent strokes.  Have regular diabetes screenings. This involves taking a blood sample to check your fasting blood sugar level. ? If you are at a normal weight and have a low risk for diabetes, have this test once every three years after 81 years of age. ? If you are overweight and have a high risk for diabetes, consider being tested at a younger age or more often. Preventing infection Hepatitis B  If you have a higher risk for hepatitis B, you should be screened for this virus. You are considered at high risk for hepatitis B if: ? You were born in a country where hepatitis B is common. Ask your health care provider which countries are considered high risk. ? Your parents were born in a high-risk country, and you have not been immunized against hepatitis B (hepatitis B vaccine). ? You have HIV or AIDS. ? You use needles to inject street drugs. ? You live with someone who has hepatitis B. ? You have had sex with someone who has hepatitis B. ? You get hemodialysis  treatment. ? You take certain medicines for conditions, including cancer, organ transplantation, and autoimmune conditions.  Hepatitis C  Blood testing is recommended for: ? Everyone born from [birth year] through 1963. ? Anyone with known risk factors for hepatitis C.  Sexually transmitted infections (STIs)  You should be screened for sexually transmitted infections (STIs) including gonorrhea and chlamydia if: ? You are sexually active  and are younger than 81 years of age. ? You are older than 81 years of age and your health care provider tells you that you are at risk for this type of infection. ? Your sexual activity has changed since you were last screened and you are at an increased risk for chlamydia or gonorrhea. Ask your health care provider if you are at risk.  If you do not have HIV, but are at risk, it may be recommended that you take a prescription medicine daily to prevent HIV infection. This is called pre-exposure prophylaxis (PrEP). You are considered at risk if: ? You are sexually active and do not regularly use condoms or know the HIV status of your partner(s). ? You take drugs by injection. ? You are sexually active with a partner who has HIV.  Talk with your health care provider about whether you are at high risk of being infected with HIV. If you choose to begin PrEP, you should first be tested for HIV. You should then be tested every 3 months for as long as you are taking PrEP. Pregnancy  If you are premenopausal and you may become pregnant, ask your health care provider about preconception counseling.  If you may become pregnant, take 400 to 800 micrograms (mcg) of folic acid every day.  If you want to prevent pregnancy, talk to your health care provider about birth control (contraception). Osteoporosis and menopause  Osteoporosis is a disease in which the bones lose minerals and strength with aging. This can result in serious bone fractures. Your risk for osteoporosis  can be identified using a bone density scan.  If you are 30 years of age or older, or if you are at risk for osteoporosis and fractures, ask your health care provider if you should be screened.  Ask your health care provider whether you should take a calcium or vitamin D supplement to lower your risk for osteoporosis.  Menopause may have certain physical symptoms and risks.  Hormone replacement therapy may reduce some of these symptoms and risks. Talk to your health care provider about whether hormone replacement therapy is right for you. Follow these instructions at home:  Schedule regular health, dental, and eye exams.  Stay current with your immunizations.  Do not use any tobacco products including cigarettes, chewing tobacco, or electronic cigarettes.  If you are pregnant, do not drink alcohol.  If you are breastfeeding, limit how much and how often you drink alcohol.  Limit alcohol intake to no more than 1 drink per day for nonpregnant women. One drink equals 12 ounces of beer, 5 ounces of wine, or 1 ounces of hard liquor.  Do not use street drugs.  Do not share needles.  Ask your health care provider for help if you need support or information about quitting drugs.  Tell your health care provider if you often feel depressed.  Tell your health care provider if you have ever been abused or do not feel safe at home. This information is not intended to replace advice given to you by your health care provider. Make sure you discuss any questions you have with your health care provider. Document Released: 05/01/2011 Document Revised: 03/23/2016 Document Reviewed: 07/20/2015 Elsevier Interactive Patient Education  [employer].

## 2017-04-05 NOTE — Progress Notes (Signed)
Subjective:    Patient ID: Molly Tucker, female    DOB: 1932/08/21, 83 y.o.   MRN: [id number]  HPI Here for subsequent medicare wellness exam and physical exam.   I have personally reviewed and have noted 1.The patient's medical and social history 2.Their use of alcohol, tobacco or illicit drugs 3.Their current medications and supplements 4.The patient's functional ability including ADL's, fall risks, home safety              risks and hearing or visual impairment. 5.Diet and physical activities 6.Evidence for depression or mood disorders 7.Care team reviewed - Cardiology - Dr Caryl Comes, [name]  Her bp at home is usually 120/70's.    Hand arthritis:  Her hand bother her.  She occasional takes tylenol and it helps. She has not taken anything else for it.    She has leg and foot cramps:  She gets them nightly.  She thinks she drinks enough fluids.  She has tried mustard.   Are there smokers in your home (other than you)? No  Risk Factors Exercise: walks the dog daily, does everything around the house and yard Dietary issues discussed:  Eats out a lot  Cardiac risk factors: advanced age, hypertension  Depression Screen  Have you felt down, depressed or hopeless? No  Have you felt little interest or pleasure in doing things?  No  Activities of Daily Living In your present state of health, do you have any difficulty performing the following activities?:  Driving? No Managing money?  No Feeding yourself? No Getting from bed to chair? No Climbing a flight of stairs? No Preparing food and eating?: No Bathing or showering? No Getting dressed: No Getting to/using the toilet? No Moving around from place to place: No In the past year have you fallen or had a near fall?: No   Are you sexually active?  No  Do you have more than one partner?  N/A  Hearing Difficulties: No Do you often ask people to speak up  or repeat themselves? No Do you experience ringing or noises in your ears? No Do you have difficulty understanding soft or whispered voices? No Vision:              Any change in vision:  no             Up to date with eye exam: Up to date   Memory:  Do you feel that you have a problem with memory? No  Do you often misplace items? No  Do you feel safe at home?  Yes  Cognitive Testing  Alert, Orientated? Yes  Normal Appearance? Yes  Recall of three objects?  Yes  Can perform simple calculations? Yes  Displays appropriate judgment? Yes  Can read the correct time from a watch face? Yes   Advanced Directives have been discussed with the patient? Yes - in place   Medications and allergies reviewed with patient and updated if appropriate.  Patient Active Problem List   Diagnosis Date Noted  . Leg cramps 04/06/2017  . Osteoarthritis, hand 02/01/2016  . Persistent atrial fibrillation ([place])   . COLONIC POLYPS, HX OF 12/15/2008  . HYPERLIPIDEMIA 11/21/2007  . HTN (hypertension) 11/21/2007  . Osteoporosis 09/13/2007  . CAROTID BRUIT, RIGHT 09/05/2007    Current Outpatient Prescriptions on File Prior to Visit  Medication Sig Dispense Refill  . acetaminophen (TYLENOL) 500 MG tablet Take 500 mg by mouth 2 (two) times daily as needed (arthritis pain).    Marland Kitchen  ELIQUIS 5 MG TABS tablet TAKE 1 TABLET (5 MG TOTAL) BY MOUTH 2 (TWO) TIMES DAILY. 180 tablet 3  . metoprolol succinate (TOPROL XL) 50 MG 24 hr tablet Take 1 tablet (50 mg total) by mouth daily. Take with or immediately following a meal. 90 tablet 3  . Multiple Vitamin (MULTIVITAMIN) tablet Take 1 tablet by mouth daily.    . Multiple Minerals-Vitamins (CAL-MAG-ZINC-D PO) Take 1 tablet by mouth every evening.     No current facility-administered medications on file prior to visit.     Past Medical History:  Diagnosis Date  . Atrial tachycardia ([place])   . Carotid bruit 2005   right, carotid doppler: <39% occlusion  . Hyperlipidemia     LDL goal =<120  . Hyperplastic colonic polyp 2006   Dr. Carlean Purl, due 2016  . Hypertension   . Osteoporosis    S/P  biphosphonates x 5 yrs  . Persistent atrial fibrillation [facility])     Past Surgical History:  Procedure Laterality Date  . CARDIOVERSION N/A 03/31/2015   Procedure: CARDIOVERSION;  Surgeon: Sanda Klein, MD;  Location: [facility] ENDOSCOPY;  Service: Cardiovascular;  Laterality: N/A;  . CARDIOVERSION N/A 12/21/2015   Procedure: CARDIOVERSION;  Surgeon: Lelon Perla, MD;  Location: [facility] ENDOSCOPY;  Service: Cardiovascular;  Laterality: N/A;  . CARDIOVERSION N/A 01/09/2017   Procedure: CARDIOVERSION;  Surgeon: Jerline Pain, MD;  Location: [facility];  Service: Cardiovascular;  Laterality: N/A;  . COLONOSCOPY W/ POLYPECTOMY  2006  . DILATION AND CURETTAGE OF UTERUS    . MOUTH SURGERY  2009-2010   implants, Dr. Marcelyn Ditty    Social History   Social History  . Marital status: Married    Spouse name: N/A  . Number of children: N/A  . Years of education: N/A   Occupational History  . Retired [employer]   Social History Main Topics  . Smoking status: Former Smoker    Quit date: 10/30/1976  . Smokeless tobacco: Never Used     Comment: Patient would ONLY smoke occasionally   . Alcohol use No  . Drug use: No  . Sexual activity: Not on file   Other Topics Concern  . Not on file   Social History Narrative   Regular exercise: yes: walking 30 minute once daily    Family History  Problem Relation Age of Onset  . Coronary artery disease Father        MI in 42s  . Cancer Sister        breast  . Coronary artery disease Sister        stent 2009  . Cancer Paternal Aunt        colon, possible breast cancer  . Arthritis Unknown        aunts  . Stroke Neg Hx     Review of Systems  Constitutional: Negative for appetite change, chills, fatigue and fever.  HENT: Negative for hearing loss and tinnitus.   Eyes: Negative for visual disturbance.  Respiratory: Negative for  cough, shortness of breath and wheezing.   Cardiovascular: Positive for palpitations (mild, occ). Negative for chest pain and leg swelling.  Gastrointestinal: Negative for abdominal pain, blood in stool, constipation, diarrhea and nausea.       No gerd  Genitourinary: Positive for frequency (prolapsed bladder ). Negative for dysuria and hematuria.  Musculoskeletal: Positive for arthralgias (hands). Negative for back pain.  Skin: Negative for color change and rash.  Neurological: Negative for light-headedness and headaches.  Psychiatric/Behavioral: Negative for  dysphoric mood. The patient is not nervous/anxious.        Objective:   Vitals:   04/06/17 0907 04/06/17 0944  BP: (!) 198/90 (!) 180/94  Pulse: (!) 55   Resp: 16   Temp: 97.7 F (36.5 C)    Filed Weights   04/06/17 0907  Weight: 140 lb (63.5 kg)   Body mass index is 24.03 kg/m.  Wt Readings from Last 3 Encounters:  04/06/17 140 lb (63.5 kg)  01/24/17 141 lb 12.8 oz (64.3 kg)  01/11/17 153 lb 12.8 oz (69.8 kg)     Physical Exam Constitutional: She appears well-developed and well-nourished. No distress.  HENT:  Head: Normocephalic and atraumatic.  Right Ear: External ear normal. Normal ear canal and TM Left Ear: External ear normal.  Normal ear canal and TM Mouth/Throat: Oropharynx is clear and moist.  Eyes: Conjunctivae and EOM are normal.  Neck: Neck supple. No tracheal deviation present. No thyromegaly present.  No carotid bruit  Cardiovascular: Normal rate, regular rhythm and normal heart sounds.   No murmur heard.  No edema. Pulmonary/Chest: Effort normal and breath sounds normal. No respiratory distress. She has no wheezes. She has no rales.  Breast: deferred to Gyn Abdominal: Soft. She exhibits no distension. There is no tenderness.  Lymphadenopathy: She has no cervical adenopathy.  Skin: Skin is warm and dry. She is not diaphoretic.  Psychiatric: She has a normal mood and affect. Her behavior is  normal.         Assessment & Plan:   Wellness Exam: Immunizations  Declined pneumovax and shingles Colonoscopy - no longer needed due to age 11 - no longer needed due to age Dexa  declined 54 - no longer seeing Eye exam   Up to date  Hearing loss  none Memory concerns/difficulties   none Independent of ADLs  fully Stressed the importance of regular exercise/ staying active   Patient received copy of preventative screening tests/immunizations recommended for the next 5-10 years.   Physical exam: Screening blood work ordered Immunizations  Declined  Colonoscopy - no longer needed due to age 22 - no longer needed due to age 71  - no longer seeing Dexa - declined Eye exams  Up to date  EKG - done by cardiology 12/2016 Exercise   - very active Weight  Normal BMI Skin   No concerns Substance abuse   none  See Problem List for Assessment and Plan of chronic medical problems.  FU annually

## 2017-04-06 ENCOUNTER — Other Ambulatory Visit (INDEPENDENT_AMBULATORY_CARE_PROVIDER_SITE_OTHER): Payer: Medicare PPO

## 2017-04-06 ENCOUNTER — Ambulatory Visit (INDEPENDENT_AMBULATORY_CARE_PROVIDER_SITE_OTHER): Payer: Medicare PPO | Admitting: Internal Medicine

## 2017-04-06 VITALS — BP 180/94 | HR 55 | Temp 97.7°F | Resp 16 | Wt 140.0 lb

## 2017-04-06 DIAGNOSIS — I10 Essential (primary) hypertension: Secondary | ICD-10-CM | POA: Diagnosis not present

## 2017-04-06 DIAGNOSIS — E782 Mixed hyperlipidemia: Secondary | ICD-10-CM | POA: Diagnosis not present

## 2017-04-06 DIAGNOSIS — Z0000 Encounter for general adult medical examination without abnormal findings: Secondary | ICD-10-CM

## 2017-04-06 DIAGNOSIS — M19041 Primary osteoarthritis, right hand: Secondary | ICD-10-CM

## 2017-04-06 DIAGNOSIS — R252 Cramp and spasm: Secondary | ICD-10-CM | POA: Diagnosis not present

## 2017-04-06 DIAGNOSIS — M19042 Primary osteoarthritis, left hand: Secondary | ICD-10-CM | POA: Diagnosis not present

## 2017-04-06 DIAGNOSIS — I4819 Other persistent atrial fibrillation: Secondary | ICD-10-CM

## 2017-04-06 DIAGNOSIS — Z0001 Encounter for general adult medical examination with abnormal findings: Secondary | ICD-10-CM

## 2017-04-06 DIAGNOSIS — M810 Age-related osteoporosis without current pathological fracture: Secondary | ICD-10-CM | POA: Diagnosis not present

## 2017-04-06 DIAGNOSIS — I481 Persistent atrial fibrillation: Secondary | ICD-10-CM

## 2017-04-06 LAB — CBC WITH DIFFERENTIAL/PLATELET
Basophils Absolute: 0.1 10*3/uL (ref 0.0–0.1)
Basophils Relative: 1.2 % (ref 0.0–3.0)
EOS ABS: 0.2 10*3/uL (ref 0.0–0.7)
Eosinophils Relative: 2.9 % (ref 0.0–5.0)
HCT: 44.6 % (ref 36.0–46.0)
HEMOGLOBIN: 14.9 g/dL (ref 12.0–15.0)
Lymphocytes Relative: 28.5 % (ref 12.0–46.0)
Lymphs Abs: 1.8 10*3/uL (ref 0.7–4.0)
MCHC: 33.5 g/dL (ref 30.0–36.0)
MCV: 89.1 fl (ref 78.0–100.0)
MONO ABS: 0.6 10*3/uL (ref 0.1–1.0)
Monocytes Relative: 9 % (ref 3.0–12.0)
NEUTROS PCT: 58.4 % (ref 43.0–77.0)
Neutro Abs: 3.7 10*3/uL (ref 1.4–7.7)
Platelets: 205 10*3/uL (ref 150.0–400.0)
RBC: 5.01 Mil/uL (ref 3.87–5.11)
RDW: 14.5 % (ref 11.5–15.5)
WBC: 6.3 10*3/uL (ref 4.0–10.5)

## 2017-04-06 LAB — COMPREHENSIVE METABOLIC PANEL
ALBUMIN: 4.1 g/dL (ref 3.5–5.2)
ALK PHOS: 34 U/L — AB (ref 39–117)
ALT: 15 U/L (ref 0–35)
AST: 18 U/L (ref 0–37)
BUN: 17 mg/dL (ref 6–23)
CHLORIDE: 103 meq/L (ref 96–112)
CO2: 30 mEq/L (ref 19–32)
CREATININE: 0.78 mg/dL (ref 0.40–1.20)
Calcium: 10.1 mg/dL (ref 8.4–10.5)
GFR: 74.92 mL/min (ref >60.00)
Glucose, Bld: 92 mg/dL (ref 70–99)
Potassium: 4.5 mEq/L (ref 3.5–5.1)
Sodium: 143 mEq/L (ref 135–145)
TOTAL PROTEIN: 6.8 g/dL (ref 6.0–8.3)
Total Bilirubin: 1 mg/dL (ref 0.2–1.2)

## 2017-04-06 LAB — LIPID PANEL
CHOLESTEROL: 185 mg/dL (ref 0–200)
HDL: 57 mg/dL (ref >39.00)
LDL CALC: 112 mg/dL — AB (ref 0–99)
NonHDL: 127.51
Total CHOL/HDL Ratio: 3
Triglycerides: 79 mg/dL (ref 0.0–149.0)
VLDL: 15.8 mg/dL (ref 0.0–40.0)

## 2017-04-06 LAB — MAGNESIUM: MAGNESIUM: 2.2 mg/dL (ref 1.5–2.5)

## 2017-04-06 LAB — TSH: TSH: 2.04 u[IU]/mL (ref 0.35–4.50)

## 2017-04-06 NOTE — Assessment & Plan Note (Addendum)
At night Will check cmp, magnesium Drinks a good amount of fluid Discussed trying different supplements

## 2017-04-06 NOTE — Assessment & Plan Note (Signed)
Declined dexa She is very active Taking a MVI

## 2017-04-06 NOTE — Assessment & Plan Note (Signed)
BP elevated here today, she monitors it at home - well controlled Continue current medication

## 2017-04-06 NOTE — Assessment & Plan Note (Signed)
Check lipid panel  

## 2017-04-07 ENCOUNTER — Encounter: Payer: Self-pay | Admitting: Internal Medicine

## 2017-04-07 NOTE — Assessment & Plan Note (Signed)
Tylenol as needed Discussed topical creams for arthritis

## 2017-04-07 NOTE — Assessment & Plan Note (Signed)
Recent cardioversion - in sinus today On eliquis, metoprolol

## 2017-07-23 ENCOUNTER — Other Ambulatory Visit: Payer: Self-pay | Admitting: Internal Medicine

## 2017-07-24 ENCOUNTER — Encounter: Payer: Self-pay | Admitting: Nurse Practitioner

## 2017-07-24 ENCOUNTER — Ambulatory Visit (INDEPENDENT_AMBULATORY_CARE_PROVIDER_SITE_OTHER): Payer: Medicare PPO | Admitting: Nurse Practitioner

## 2017-07-24 VITALS — BP 130/62 | HR 55 | Ht 64.0 in | Wt 142.4 lb

## 2017-07-24 DIAGNOSIS — Z79899 Other long term (current) drug therapy: Secondary | ICD-10-CM | POA: Diagnosis not present

## 2017-07-24 DIAGNOSIS — Z7901 Long term (current) use of anticoagulants: Secondary | ICD-10-CM

## 2017-07-24 DIAGNOSIS — I480 Paroxysmal atrial fibrillation: Secondary | ICD-10-CM | POA: Diagnosis not present

## 2017-07-24 DIAGNOSIS — I10 Essential (primary) hypertension: Secondary | ICD-10-CM | POA: Diagnosis not present

## 2017-07-24 DIAGNOSIS — Z9889 Other specified postprocedural states: Secondary | ICD-10-CM | POA: Diagnosis not present

## 2017-07-24 NOTE — Progress Notes (Signed)
CARDIOLOGY OFFICE NOTE  Date:  07/24/2017    Molly Tucker Date of Birth: 06/19/1932 Medical Record #[id number]  PCP:  Binnie Rail, MD  Cardiologist:  Ree Shay    Chief Complaint  Patient presents with  . Atrial Fibrillation    Follow up visit - seen for [name]    History of Present Illness: Molly Tucker is a 83 y.o. female who presents today for a follow up visit. Seen for [name].  She has a history of PAF, HTN, HLD, and osteoporosis. She has had prior cardioversions back to NSR - with improvement in symptoms. Cardioverted in June of 2016, February of 2017 and lastly in March of 2018.   I last saw her back in March - she had had recurrent AF back in February - lots of stress in her life - ended up increasing her Toprol and getting referred for cardioversion which was successful. Toprol ended up being cut back. Was doing ok at her last visit.   Comes in today. [name]. She is doing ok. Cramps in her legs. Rhythm has been good. BP is good. She is very happy with how she is doing. No problems with her Eliquis. No bleeding. Labs from June noted. She has no real concerns.   Past Medical History:  Diagnosis Date  . Atrial tachycardia ([place])   . Carotid bruit 2005   right, carotid doppler: <39% occlusion  . Hyperlipidemia    LDL goal =<120  . Hyperplastic colonic polyp 2006   Dr. Carlean Purl, due 2016  . Hypertension   . Osteoporosis    S/P  biphosphonates x 5 yrs  . Persistent atrial fibrillation [facility])     Past Surgical History:  Procedure Laterality Date  . CARDIOVERSION N/A 03/31/2015   Procedure: CARDIOVERSION;  Surgeon: Sanda Klein, MD;  Location: [facility] ENDOSCOPY;  Service: Cardiovascular;  Laterality: N/A;  . CARDIOVERSION N/A 12/21/2015   Procedure: CARDIOVERSION;  Surgeon: Lelon Perla, MD;  Location: [facility] ENDOSCOPY;  Service: Cardiovascular;  Laterality: N/A;  . CARDIOVERSION N/A 01/09/2017   Procedure: CARDIOVERSION;  Surgeon: Jerline Pain, MD;  Location: [facility];  Service: Cardiovascular;  Laterality: N/A;  . COLONOSCOPY W/ POLYPECTOMY  2006  . DILATION AND CURETTAGE OF UTERUS    . MOUTH SURGERY  2009-2010   implants, Dr. Marcelyn Ditty     Medications: Current Meds  Medication Sig  . acetaminophen (TYLENOL) 500 MG tablet Take 500 mg by mouth 2 (two) times daily as needed (arthritis pain).  Marland Kitchen ELIQUIS 5 MG TABS tablet TAKE 1 TABLET TWICE DAILY  . metoprolol succinate (TOPROL XL) 50 MG 24 hr tablet Take 1 tablet (50 mg total) by mouth daily. Take with or immediately following a meal.  . Multiple Minerals-Vitamins (CAL-MAG-ZINC-D PO) Take 1 tablet by mouth every evening.  . Multiple Vitamin (MULTIVITAMIN) tablet Take 1 tablet by mouth daily.     Allergies: No Known Allergies  Social History: The patient  reports that she quit smoking about 40 years ago. She has never used smokeless tobacco. She reports that she does not drink alcohol or use drugs.   Family History: The patient's family history includes Arthritis in her unknown relative; Cancer in her paternal aunt and sister; Coronary artery disease in her father and sister.   Review of Systems: Please see the history of present illness.   Otherwise, the review of systems is positive for none.   All other systems are  reviewed and negative.   Physical Exam: VS:  BP 130/62 (BP Location: Left Arm, Patient Position: Sitting, Cuff Size: Normal)   Pulse (!) 55   Ht 5\' 4"  (1.626 m)   Wt 142 lb 6.4 oz (64.6 kg)   SpO2 93% Comment: at rest  BMI 24.44 kg/m  .  BMI Body mass index is 24.44 kg/m.  Wt Readings from Last 3 Encounters:  07/24/17 142 lb 6.4 oz (64.6 kg)  04/06/17 140 lb (63.5 kg)  01/24/17 141 lb 12.8 oz (64.3 kg)    General: Pleasant. She looks younger than her stated age. She is alert and in no acute distress.   HEENT: Normal.  Neck: Supple, no JVD, carotid bruits, or masses noted.  Cardiac: Regular rate and rhythm. No murmurs, rubs, or gallops.  No edema.  Respiratory:  Lungs are clear to auscultation bilaterally with normal work of breathing.  GI: Soft and nontender.  MS: No deformity or atrophy. Gait and ROM intact.  Skin: Warm and dry. Color is normal.  Neuro:  Strength and sensation are intact and no gross focal deficits noted.  Psych: Alert, appropriate and with normal affect.   LABORATORY DATA:  EKG:  EKG is not ordered today.  Lab Results  Component Value Date   WBC 6.3 04/06/2017   HGB 14.9 04/06/2017   HCT 44.6 04/06/2017   PLT 205.0 04/06/2017   GLUCOSE 92 04/06/2017   CHOL 185 04/06/2017   TRIG 79.0 04/06/2017   HDL 57.00 04/06/2017   LDLCALC 112 (H) 04/06/2017   ALT 15 04/06/2017   AST 18 04/06/2017   NA 143 04/06/2017   K 4.5 04/06/2017   CL 103 04/06/2017   CREATININE 0.78 04/06/2017   BUN 17 04/06/2017   CO2 30 04/06/2017   TSH 2.04 04/06/2017   INR 1.0 01/01/2017   HGBA1C 5.8 12/22/2009     BNP (last 3 results) No results for input(s): BNP in the last 8760 hours.  ProBNP (last 3 results) No results for input(s): PROBNP in the last 8760 hours.   Other Studies Reviewed Today:  Echo Study Conclusions 02/2015  - Left ventricle: The cavity size was normal. Wall thickness was normal. Systolic function was normal. The estimated ejection fraction was in the range of 55% to 60%.Wall motion was normal; there were no regional wall motion abnormalities. - Right ventricle: The cavity size was mildly dilated. - Right atrium: The atrium was mildly dilated. - Atrial septum: No defect or patent foramen ovale was identified. - Tricuspid valve: There was moderate regurgitation.   Assessment/Plan:  1. PAF - has now had cardioversion x 3 - last back in March of 2018. She is doing well and remains in NSR. Discussed previously with [name] - tohold on AAD therapy for now. I have left her on her current regimen.   2. Chronic anticoagulation with Eliquis - no missed doses. No problems  noted. No falls noted. Samples given today.   3. Probable diastolic HF - when she has AF - not an issue now.    Current medicines are reviewed with the patient today.  The patient does not have concerns regarding medicines other than what has been noted above.  The following changes have been made:  See above.  Labs/ tests ordered today include:   No orders of the defined types were placed in this encounter.    Disposition:   FU with me in 6 months.   Patient is agreeable to this plan and will  call if any problems develop in the interim.   SignedTruitt Merle, NP  07/24/2017 3:30 PM  [facility] [address] [place], [place]  [address] Phone: [phone] Fax: [phone]

## 2017-07-24 NOTE — Telephone Encounter (Signed)
Eliquis 5mg  refill received; Pt is 83 yrs old, wt-63.5kg, Crea-0.78 on 04/06/17, last seen by [name] on 01/24/17; will send in request to requested pharmacy.

## 2017-07-24 NOTE — Patient Instructions (Addendum)
We will be checking the following labs today - NONE   Medication Instructions:    Continue with your current medicines.     Testing/Procedures To Be Arranged:  N/A  Follow-Up:   See me in 6 months.    Other Special Instructions:   N/A    If you need a refill on your cardiac medications before your next appointment, please call your pharmacy.   Call the [facility] office at [phone] if you have any questions, problems or concerns.      

## 2017-08-29 ENCOUNTER — Ambulatory Visit (INDEPENDENT_AMBULATORY_CARE_PROVIDER_SITE_OTHER): Payer: Medicare PPO

## 2017-08-29 DIAGNOSIS — Z23 Encounter for immunization: Secondary | ICD-10-CM | POA: Diagnosis not present

## 2017-11-04 ENCOUNTER — Other Ambulatory Visit: Payer: Self-pay | Admitting: Nurse Practitioner

## 2017-11-04 DIAGNOSIS — Z7901 Long term (current) use of anticoagulants: Secondary | ICD-10-CM

## 2017-11-04 DIAGNOSIS — I480 Paroxysmal atrial fibrillation: Secondary | ICD-10-CM

## 2018-01-08 ENCOUNTER — Encounter: Payer: Self-pay | Admitting: Nurse Practitioner

## 2018-01-08 ENCOUNTER — Ambulatory Visit: Payer: Medicare PPO | Admitting: Nurse Practitioner

## 2018-01-08 VITALS — BP 146/70 | HR 55 | Ht 64.0 in | Wt 144.4 lb

## 2018-01-08 DIAGNOSIS — I480 Paroxysmal atrial fibrillation: Secondary | ICD-10-CM

## 2018-01-08 DIAGNOSIS — Z79899 Other long term (current) drug therapy: Secondary | ICD-10-CM | POA: Diagnosis not present

## 2018-01-08 NOTE — Progress Notes (Signed)
CARDIOLOGY OFFICE NOTE  Date:  01/08/2018    Molly Tucker Date of Birth: 1932-07-29 Medical Record #[id number]  PCP:  Binnie Rail, MD  Cardiologist:  Ree Shay   Chief Complaint  Patient presents with  . Atrial Fibrillation    6 month check. Seen for [name]    History of Present Illness: Molly Tucker is a 84 y.o. female who presents today for a follow up visit. Seen for [name].  She has a history of PAF, HTN, HLD, and osteoporosis. She has had prior cardioversions back to NSR - with improvement in symptoms. Cardioverted in June of 2016, February of 2017 and lastly in March of 2018.   Last seen back in September - she was doing ok. Rhythm was stable.   Comes in today. Here alone today. She is doing ok. Rhythm has been good. It has been almost a year since her last cardioversion. She is tolerating her Eliquis. No falls. She admits the mental aspects of taking care of [name] can get difficult. No chest pain. Not short of breath. BP good at home.   Past Medical History:  Diagnosis Date  . Atrial tachycardia ([place])   . Carotid bruit 2005   right, carotid doppler: <39% occlusion  . Hyperlipidemia    LDL goal =<120  . Hyperplastic colonic polyp 2006   Dr. Carlean Purl, due 2016  . Hypertension   . Osteoporosis    S/P  biphosphonates x 5 yrs  . Persistent atrial fibrillation [facility])     Past Surgical History:  Procedure Laterality Date  . CARDIOVERSION N/A 03/31/2015   Procedure: CARDIOVERSION;  Surgeon: Sanda Klein, MD;  Location: [facility] ENDOSCOPY;  Service: Cardiovascular;  Laterality: N/A;  . CARDIOVERSION N/A 12/21/2015   Procedure: CARDIOVERSION;  Surgeon: Lelon Perla, MD;  [facility] ENDOSCOPY;  Service: Cardiovascular;  Laterality: N/A;  . CARDIOVERSION N/A 01/09/2017   Procedure: CARDIOVERSION;  Surgeon: Jerline Pain, MD;  Location: [facility];  Service: Cardiovascular;  Laterality: N/A;  . COLONOSCOPY W/ POLYPECTOMY  2006  . DILATION AND  CURETTAGE OF UTERUS    . MOUTH SURGERY  2009-2010   implants, Dr. Marcelyn Ditty     Medications: Current Meds  Medication Sig  . acetaminophen (TYLENOL) 500 MG tablet Take 500 mg by mouth 2 (two) times daily as needed (arthritis pain).  Marland Kitchen ELIQUIS 5 MG TABS tablet TAKE 1 TABLET TWICE DAILY  . metoprolol succinate (TOPROL-XL) 50 MG 24 hr tablet TAKE 1 TABLET DAILY WITH OR IMMEDIATELY FOLLOWING A MEAL.  . Multiple Vitamin (MULTIVITAMIN) tablet Take 1 tablet by mouth daily.     Allergies: No Known Allergies  Social History: The patient  reports that she quit smoking about 41 years ago. she has never used smokeless tobacco. She reports that she does not drink alcohol or use drugs.   Family History: The patient's family history includes Arthritis in her unknown relative; Cancer in her paternal aunt and sister; Coronary artery disease in her father and sister.   Review of Systems: Please see the history of present illness.   Otherwise, the review of systems is positive for none.   All other systems are reviewed and negative.   Physical Exam: VS:  BP (!) 146/70   Pulse (!) 55   Ht 5\' 4"  (1.626 m)   Wt 144 lb 6.4 oz (65.5 kg)   SpO2 95%   BMI 24.79 kg/m  .  BMI Body mass index is  24.79 kg/m.  Wt Readings from Last 3 Encounters:  01/08/18 144 lb 6.4 oz (65.5 kg)  07/24/17 142 lb 6.4 oz (64.6 kg)  04/06/17 140 lb (63.5 kg)    General: Pleasant. She looks younger than her stated age. She is alert and in no acute distress.   HEENT: Normal.  Neck: Supple, no JVD, carotid bruits, or masses noted.  Cardiac: Regular rate and rhythm. No murmurs, rubs, or gallops. No edema.  Respiratory:  Lungs are clear to auscultation bilaterally with normal work of breathing.  GI: Soft and nontender.  MS: No deformity or atrophy. Gait and ROM intact.  Skin: Warm and dry. Color is normal.  Neuro:  Strength and sensation are intact and no gross focal deficits noted.  Psych: Alert, appropriate and with  normal affect.   LABORATORY DATA:  EKG:  EKG is ordered today. This demonstrates sinus brady  Lab Results  Component Value Date   WBC 6.3 04/06/2017   HGB 14.9 04/06/2017   HCT 44.6 04/06/2017   PLT 205.0 04/06/2017   GLUCOSE 92 04/06/2017   CHOL 185 04/06/2017   TRIG 79.0 04/06/2017   HDL 57.00 04/06/2017   LDLCALC 112 (H) 04/06/2017   ALT 15 04/06/2017   AST 18 04/06/2017   NA 143 04/06/2017   K 4.5 04/06/2017   CL 103 04/06/2017   CREATININE 0.78 04/06/2017   BUN 17 04/06/2017   CO2 30 04/06/2017   TSH 2.04 04/06/2017   INR 1.0 01/01/2017   HGBA1C 5.8 12/22/2009     BNP (last 3 results) No results for input(s): BNP in the last 8760 hours.  ProBNP (last 3 results) No results for input(s): PROBNP in the last 8760 hours.   Other Studies Reviewed Today:  Echo Study Conclusions 02/2015  - Left ventricle: The cavity size was normal. Wall thickness was normal. Systolic function was normal. The estimated ejection fraction was in the range of 55% to 60%.Wall motion was normal; there were no regional wall motion abnormalities. - Right ventricle: The cavity size was mildly dilated. - Right atrium: The atrium was mildly dilated. - Atrial septum: No defect or patent foramen ovale was identified. - Tricuspid valve: There was moderate regurgitation.   Assessment/Plan:  1. PAF - has now had cardioversion x 3 - last back in March of 2018. She remains in sinus. She is doing well. Discussed previously with [name] - tohold on AAD therapy for now. No changes made today.   2. Chronic anticoagulation with Eliquis - recheck lab today. No problems  3. Probable diastolic HF - when she has AF - not an issue now.    Current medicines are reviewed with the patient today.  The patient does not have concerns regarding medicines other than what has been noted above.  The following changes have been made:  See above.  Labs/ tests ordered today include:    Orders  Placed This Encounter  Procedures  . Basic metabolic panel  . CBC  . EKG 12-Lead     Disposition:   FU with me in 6 months.   Patient is agreeable to this plan and will call if any problems develop in the interim.   SignedTruitt Merle, NP  01/08/2018 2:47 PM  [facility] [address] [place], [place]  [address] Phone: [phone] Fax: [phone]

## 2018-01-08 NOTE — Patient Instructions (Addendum)
We will be checking the following labs today - BMET & CBC   Medication Instructions:    Continue with your current medicines.     Testing/Procedures To Be Arranged:  N/A  Follow-Up:   See me in 6 months    Other Special Instructions:   N/A    If you need a refill on your cardiac medications before your next appointment, please call your pharmacy.   Call the [place] office at [phone] if you have any questions, problems or concerns.

## 2018-01-09 LAB — CBC
Hematocrit: 41.2 % (ref 34.0–46.6)
Hemoglobin: 13.6 g/dL (ref 11.1–15.9)
MCH: 30.1 pg (ref 26.6–33.0)
MCHC: 33 g/dL (ref 31.5–35.7)
MCV: 91 fL (ref 79–97)
Platelets: 201 10*3/uL (ref 150–379)
RBC: 4.52 x10E6/uL (ref 3.77–5.28)
RDW: 13.7 % (ref 12.3–15.4)
WBC: 5.4 10*3/uL (ref 3.4–10.8)

## 2018-01-09 LAB — BASIC METABOLIC PANEL
BUN/Creatinine Ratio: 23 (ref 12–28)
BUN: 17 mg/dL (ref 8–27)
CO2: 26 mmol/L (ref 20–29)
Calcium: 9.3 mg/dL (ref 8.7–10.3)
Chloride: 102 mmol/L (ref 96–106)
Creatinine, Ser: 0.74 mg/dL (ref 0.57–1.00)
GFR calc Af Amer: 86 mL/min/{1.73_m2} (ref >59)
GFR calc non Af Amer: 75 mL/min/{1.73_m2} (ref >59)
Glucose: 87 mg/dL (ref 65–99)
Potassium: 4.8 mmol/L (ref 3.5–5.2)
Sodium: 141 mmol/L (ref 134–144)

## 2018-02-21 ENCOUNTER — Telehealth: Payer: Self-pay | Admitting: Emergency Medicine

## 2018-02-21 NOTE — Telephone Encounter (Signed)
Called patient to schedule AWV. Pt declined at this time. 

## 2018-04-18 ENCOUNTER — Encounter: Payer: Self-pay | Admitting: Internal Medicine

## 2018-04-18 NOTE — Patient Instructions (Addendum)
All other Health Maintenance issues reviewed.   All recommended immunizations and age-appropriate screenings are up-to-date or discussed.  No immunizations administered today.   Medications reviewed and updated.  No changes recommended at this time.  Please followup in one year   Health Maintenance, Female Adopting a healthy lifestyle and getting preventive care can go a long way to promote health and wellness. Talk with your health care provider about what schedule of regular examinations is right for you. This is a good chance for you to check in with your provider about disease prevention and staying healthy. In between checkups, there are plenty of things you can do on your own. Experts have done a lot of research about which lifestyle changes and preventive measures are most likely to keep you healthy. Ask your health care provider for more information. Weight and diet Eat a healthy diet  Be sure to include plenty of vegetables, fruits, low-fat dairy products, and lean protein.  Do not eat a lot of foods high in solid fats, added sugars, or salt.  Get regular exercise. This is one of the most important things you can do for your health. ? Most adults should exercise for at least 150 minutes each week. The exercise should increase your heart rate and make you sweat (moderate-intensity exercise). ? Most adults should also do strengthening exercises at least twice a week. This is in addition to the moderate-intensity exercise.  Maintain a healthy weight  Body mass index (BMI) is a measurement that can be used to identify possible weight problems. It estimates body fat based on height and weight. Your health care provider can help determine your BMI and help you achieve or maintain a healthy weight.  For females 20 years of age and older: ? A BMI below 18.5 is considered underweight. ? A BMI of 18.5 to 24.9 is normal. ? A BMI of 25 to 29.9 is considered overweight. ? A BMI of 30 and  above is considered obese.  Watch levels of cholesterol and blood lipids  You should start having your blood tested for lipids and cholesterol at 82 years of age, then have this test every 5 years.  You may need to have your cholesterol levels checked more often if: ? Your lipid or cholesterol levels are high. ? You are older than 82 years of age. ? You are at high risk for heart disease.  Cancer screening Lung Cancer  Lung cancer screening is recommended for adults 55-80 years old who are at high risk for lung cancer because of a history of smoking.  A yearly low-dose CT scan of the lungs is recommended for people who: ? Currently smoke. ? Have quit within the past 15 years. ? Have at least a 30-pack-year history of smoking. A pack year is smoking an average of one pack of cigarettes a day for 1 year.  Yearly screening should continue until it has been 15 years since you quit.  Yearly screening should stop if you develop a health problem that would prevent you from having lung cancer treatment.  Breast Cancer  Practice breast self-awareness. This means understanding how your breasts normally appear and feel.  It also means doing regular breast self-exams. Let your health care provider know about any changes, no matter how small.  If you are in your 20s or 30s, you should have a clinical breast exam (CBE) by a health care provider every 1-3 years as part of a regular health exam.  If you   you are 40 or older, have a CBE every year. Also consider having a breast X-ray (mammogram) every year.  If you have a family history of breast cancer, talk to your health care provider about genetic screening.  If you are at high risk for breast cancer, talk to your health care provider about having an MRI and a mammogram every year.  Breast cancer gene (BRCA) assessment is recommended for women who have family members with BRCA-related cancers. BRCA-related cancers  include: ? Breast. ? Ovarian. ? Tubal. ? Peritoneal cancers.  Results of the assessment will determine the need for genetic counseling and BRCA1 and BRCA2 testing.  Cervical Cancer Your health care provider may recommend that you be screened regularly for cancer of the pelvic organs (ovaries, uterus, and vagina). This screening involves a pelvic examination, including checking for microscopic changes to the surface of your cervix (Pap test). You may be encouraged to have this screening done every 3 years, beginning at age 34.  For women ages 5-65, health care providers may recommend pelvic exams and Pap testing every 3 years, or they may recommend the Pap and pelvic exam, combined with testing for human papilloma virus (HPV), every 5 years. Some types of HPV increase your risk of cervical cancer. Testing for HPV may also be done on women of any age with unclear Pap test results.  Other health care providers may not recommend any screening for nonpregnant women who are considered low risk for pelvic cancer and who do not have symptoms. Ask your health care provider if a screening pelvic exam is right for you.  If you have had past treatment for cervical cancer or a condition that could lead to cancer, you need Pap tests and screening for cancer for at least 20 years after your treatment. If Pap tests have been discontinued, your risk factors (such as having a new sexual partner) need to be reassessed to determine if screening should resume. Some women have medical problems that increase the chance of getting cervical cancer. In these cases, your health care provider may recommend more frequent screening and Pap tests.  Colorectal Cancer  This type of cancer can be detected and often prevented.  Routine colorectal cancer screening usually begins at 82 years of age and continues through 82 years of age.  Your health care provider may recommend screening at an earlier age if you have risk factors  for colon cancer.  Your health care provider may also recommend using home test kits to check for hidden blood in the stool.  A small camera at the end of a tube can be used to examine your colon directly (sigmoidoscopy or colonoscopy). This is done to check for the earliest forms of colorectal cancer.  Routine screening usually begins at age 70.  Direct examination of the colon should be repeated every 5-10 years through 82 years of age. However, you may need to be screened more often if early forms of precancerous polyps or small growths are found.  Skin Cancer  Check your skin from head to toe regularly.  Tell your health care provider about any new moles or changes in moles, especially if there is a change in a mole's shape or color.  Also tell your health care provider if you have a mole that is larger than the size of a pencil eraser.  Always use sunscreen. Apply sunscreen liberally and repeatedly throughout the day.  Protect yourself by wearing long sleeves, pants, a wide-brimmed hat, and sunglasses  whenever you are outside.  Heart disease, diabetes, and high blood pressure  High blood pressure causes heart disease and increases the risk of stroke. High blood pressure is more likely to develop in: ? People who have blood pressure in the high end of the normal range (130-139/85-89 mm Hg). ? People who are overweight or obese. ? People who are African American.  If you are 64-28 years of age, have your blood pressure checked every 3-5 years. If you are 58 years of age or older, have your blood pressure checked every year. You should have your blood pressure measured twice-once when you are at a hospital or clinic, and once when you are not at a hospital or clinic. Record the average of the two measurements. To check your blood pressure when you are not at a hospital or clinic, you can use: ? An automated blood pressure machine at a pharmacy. ? A home blood pressure monitor.  If  you are between 46 years and 42 years old, ask your health care provider if you should take aspirin to prevent strokes.  Have regular diabetes screenings. This involves taking a blood sample to check your fasting blood sugar level. ? If you are at a normal weight and have a low risk for diabetes, have this test once every three years after 82 years of age. ? If you are overweight and have a high risk for diabetes, consider being tested at a younger age or more often. Preventing infection Hepatitis B  If you have a higher risk for hepatitis B, you should be screened for this virus. You are considered at high risk for hepatitis B if: ? You were born in a country where hepatitis B is common. Ask your health care provider which countries are considered high risk. ? Your parents were born in a high-risk country, and you have not been immunized against hepatitis B (hepatitis B vaccine). ? You have HIV or AIDS. ? You use needles to inject street drugs. ? You live with someone who has hepatitis B. ? You have had sex with someone who has hepatitis B. ? You get hemodialysis treatment. ? You take certain medicines for conditions, including cancer, organ transplantation, and autoimmune conditions.  Hepatitis C  Blood testing is recommended for: ? Everyone born from [birth year] through 1963. ? Anyone with known risk factors for hepatitis C.  Sexually transmitted infections (STIs)  You should be screened for sexually transmitted infections (STIs) including gonorrhea and chlamydia if: ? You are sexually active and are younger than 82 years of age. ? You are older than 82 years of age and your health care provider tells you that you are at risk for this type of infection. ? Your sexual activity has changed since you were last screened and you are at an increased risk for chlamydia or gonorrhea. Ask your health care provider if you are at risk.  If you do not have HIV, but are at risk, it may be recommended  that you take a prescription medicine daily to prevent HIV infection. This is called pre-exposure prophylaxis (PrEP). You are considered at risk if: ? You are sexually active and do not regularly use condoms or know the HIV status of your partner(s). ? You take drugs by injection. ? You are sexually active with a partner who has HIV.  Talk with your health care provider about whether you are at high risk of being infected with HIV. If you choose to begin PrEP, you should  first be tested for HIV. You should then be tested every 3 months for as long as you are taking PrEP. Pregnancy  If you are premenopausal and you may become pregnant, ask your health care provider about preconception counseling.  If you may become pregnant, take 400 to 800 micrograms (mcg) of folic acid every day.  If you want to prevent pregnancy, talk to your health care provider about birth control (contraception). Osteoporosis and menopause  Osteoporosis is a disease in which the bones lose minerals and strength with aging. This can result in serious bone fractures. Your risk for osteoporosis can be identified using a bone density scan.  If you are 38 years of age or older, or if you are at risk for osteoporosis and fractures, ask your health care provider if you should be screened.  Ask your health care provider whether you should take a calcium or vitamin D supplement to lower your risk for osteoporosis.  Menopause may have certain physical symptoms and risks.  Hormone replacement therapy may reduce some of these symptoms and risks. Talk to your health care provider about whether hormone replacement therapy is right for you. Follow these instructions at home:  Schedule regular health, dental, and eye exams.  Stay current with your immunizations.  Do not use any tobacco products including cigarettes, chewing tobacco, or electronic cigarettes.  If you are pregnant, do not drink alcohol.  If you are  breastfeeding, limit how much and how often you drink alcohol.  Limit alcohol intake to no more than 1 drink per day for nonpregnant women. One drink equals 12 ounces of beer, 5 ounces of wine, or 1 ounces of hard liquor.  Do not use street drugs.  Do not share needles.  Ask your health care provider for help if you need support or information about quitting drugs.  Tell your health care provider if you often feel depressed.  Tell your health care provider if you have ever been abused or do not feel safe at home. This information is not intended to replace advice given to you by your health care provider. Make sure you discuss any questions you have with your health care provider. Document Released: 05/01/2011 Document Revised: 03/23/2016 Document Reviewed: 07/20/2015 Elsevier Interactive Patient Education  [employer].

## 2018-04-18 NOTE — Progress Notes (Signed)
Subjective:    Patient ID: Molly Tucker, female    DOB: 09/04/1932, 84 y.o.   MRN: [id number]  HPI She is here for a physical exam.   She denies changes in her health.  She has not concerns.   She monitors her BP at home regularly and her BP is well controlled at home.    Medications and allergies reviewed with patient and updated if appropriate.  Patient Active Problem List   Diagnosis Date Noted  . Leg cramps 04/06/2017  . Osteoarthritis, hand 02/01/2016  . Persistent atrial fibrillation ([place])   . COLONIC POLYPS, HX OF 12/15/2008  . HYPERLIPIDEMIA 11/21/2007  . HTN (hypertension) 11/21/2007  . Osteoporosis 09/13/2007    Current Outpatient Medications on File Prior to Visit  Medication Sig Dispense Refill  . acetaminophen (TYLENOL) 500 MG tablet Take 500 mg by mouth 2 (two) times daily as needed (arthritis pain).    Marland Kitchen ELIQUIS 5 MG TABS tablet TAKE 1 TABLET TWICE DAILY 180 tablet 3  . metoprolol succinate (TOPROL-XL) 50 MG 24 hr tablet TAKE 1 TABLET DAILY WITH OR IMMEDIATELY FOLLOWING A MEAL. 90 tablet 3  . Multiple Vitamin (MULTIVITAMIN) tablet Take 1 tablet by mouth daily.     No current facility-administered medications on file prior to visit.     Past Medical History:  Diagnosis Date  . Atrial tachycardia ([place])   . Carotid bruit 2005   right, carotid doppler: <39% occlusion  . Hyperlipidemia    LDL goal =<120  . Hyperplastic colonic polyp 2006   Dr. Carlean Purl, due 2016  . Hypertension   . Osteoporosis    S/P  biphosphonates x 5 yrs  . Persistent atrial fibrillation [facility])     Past Surgical History:  Procedure Laterality Date  . CARDIOVERSION N/A 03/31/2015   Procedure: CARDIOVERSION;  Surgeon: Sanda Klein, MD;  Location: [facility] ENDOSCOPY;  Service: Cardiovascular;  Laterality: N/A;  . CARDIOVERSION N/A 12/21/2015   Procedure: CARDIOVERSION;  Surgeon: Lelon Perla, MD;  Location: [facility] ENDOSCOPY;  Service: Cardiovascular;  Laterality: N/A;  . CARDIOVERSION  N/A 01/09/2017   Procedure: CARDIOVERSION;  Surgeon: Jerline Pain, MD;  Location: [place];  Service: Cardiovascular;  Laterality: N/A;  . COLONOSCOPY W/ POLYPECTOMY  2006  . DILATION AND CURETTAGE OF UTERUS    . MOUTH SURGERY  2009-2010   implants, Dr. Marcelyn Ditty    Social History   Socioeconomic History  . Marital status: Married    Spouse name: Not on file  . Number of children: Not on file  . Years of education: Not on file  . Highest education level: Not on file  Occupational History  . Occupation: Retired    Fish farm manager: [employer]  . Financial resource strain: Not on file  . Food insecurity:    Worry: Not on file    Inability: Not on file  . Transportation needs:    Medical: Not on file    Non-medical: Not on file  Tobacco Use  . Smoking status: Former Smoker    Last attempt to quit: 10/30/1976    Years since quitting: 41.4  . Smokeless tobacco: Never Used  . Tobacco comment: Patient would ONLY smoke occasionally   Substance and Sexual Activity  . Alcohol use: No  . Drug use: No  . Sexual activity: Not on file  Lifestyle  . Physical activity:    Days per week: Not on file    Minutes per session: Not on file  .  Stress: Not on file  Relationships  . Social connections:    Talks on phone: Not on file    Gets together: Not on file    Attends religious service: Not on file    Active member of club or organization: Not on file    Attends meetings of clubs or organizations: Not on file    Relationship status: Not on file  Other Topics Concern  . Not on file  Social History Narrative   Regular exercise: yes: walking 30 minute once daily    Family History  Problem Relation Age of Onset  . Coronary artery disease Father        MI in 27s  . Cancer Sister        breast  . Coronary artery disease Sister        stent 2009  . Cancer Paternal Aunt        colon, possible breast cancer  . Arthritis Unknown        aunts  . Stroke Neg Hx     Review  of Systems  Constitutional: Negative for chills and fever.  Eyes: Negative for visual disturbance.  Respiratory: Negative for cough, shortness of breath and wheezing.   Cardiovascular: Negative for chest pain, palpitations and leg swelling.  Gastrointestinal: Negative for abdominal pain, blood in stool, constipation, diarrhea and nausea.       No gerd  Genitourinary: Negative for dysuria and hematuria.  Musculoskeletal: Negative for arthralgias and back pain.  Skin: Negative for rash.  Neurological: Negative for dizziness, light-headedness and headaches.  Psychiatric/Behavioral: Negative for dysphoric mood. The patient is not nervous/anxious.        Objective:   Vitals:   04/19/18 1349  BP: (!) 152/74  Pulse: (!) 53  Resp: 16  Temp: 97.7 F (36.5 C)  SpO2: 98%   Filed Weights   04/19/18 1349  Weight: 141 lb (64 kg)   Body mass index is 24.2 kg/m.  Wt Readings from Last 3 Encounters:  04/19/18 141 lb (64 kg)  01/08/18 144 lb 6.4 oz (65.5 kg)  07/24/17 142 lb 6.4 oz (64.6 kg)     Physical Exam Constitutional: She appears well-developed and well-nourished. No distress.  HENT:  Head: Normocephalic and atraumatic.  Right Ear: External ear normal. Normal ear canal and TM Left Ear: External ear normal.  Normal ear canal and TM Mouth/Throat: Oropharynx is clear and moist.  Eyes: Conjunctivae and EOM are normal.  Neck: Neck supple. No tracheal deviation present. No thyromegaly present.  No carotid bruit  Cardiovascular: Normal rate, regular rhythm and normal heart sounds.   No murmur heard.  No edema. Pulmonary/Chest: Effort normal and breath sounds normal. No respiratory distress. She has no wheezes. She has no rales.  Breast: deferred to Gyn Abdominal: Soft. She exhibits no distension. There is no tenderness.  Lymphadenopathy: She has no cervical adenopathy.  Skin: Skin is warm and dry. She is not diaphoretic.  Psychiatric: She has a normal mood and affect. Her  behavior is normal.        Assessment & Plan:   Physical exam: Screening blood work  Deferred - hold off for now Immunizations  prevnar due- deferred, discussed shingrix Colonoscopy   Aged out Mammogram   Aged out [place]   Aged out [place] last year Eye exams  Up to date  Exercise  Walking,  very active,  yard work Weight   Normal BMI Skin  - has appt for  derm this summer, some benign appearing moles Substance abuse    none  See Problem List for Assessment and Plan of chronic medical problems.    FU in one year

## 2018-04-19 ENCOUNTER — Ambulatory Visit (INDEPENDENT_AMBULATORY_CARE_PROVIDER_SITE_OTHER): Payer: Medicare PPO | Admitting: Internal Medicine

## 2018-04-19 ENCOUNTER — Encounter: Payer: Self-pay | Admitting: Internal Medicine

## 2018-04-19 VITALS — BP 152/74 | HR 53 | Temp 97.7°F | Resp 16 | Ht 64.0 in | Wt 141.0 lb

## 2018-04-19 DIAGNOSIS — Z0000 Encounter for general adult medical examination without abnormal findings: Secondary | ICD-10-CM | POA: Diagnosis not present

## 2018-04-19 DIAGNOSIS — I481 Persistent atrial fibrillation: Secondary | ICD-10-CM

## 2018-04-19 DIAGNOSIS — M810 Age-related osteoporosis without current pathological fracture: Secondary | ICD-10-CM

## 2018-04-19 DIAGNOSIS — I4819 Other persistent atrial fibrillation: Secondary | ICD-10-CM

## 2018-04-19 DIAGNOSIS — I10 Essential (primary) hypertension: Secondary | ICD-10-CM | POA: Diagnosis not present

## 2018-04-19 NOTE — Assessment & Plan Note (Addendum)
Well controlled at home Has white coat htn Continue current medication cmp 3 months ago normal

## 2018-04-19 NOTE — Assessment & Plan Note (Signed)
Deferred dexa She is exercising Taking a MVI

## 2018-04-19 NOTE — Assessment & Plan Note (Signed)
Rate controlled Asymptomatic continue eliquis, metoprolol

## 2018-07-09 ENCOUNTER — Ambulatory Visit: Payer: Medicare PPO | Admitting: Nurse Practitioner

## 2018-08-04 ENCOUNTER — Other Ambulatory Visit: Payer: Self-pay | Admitting: Internal Medicine

## 2018-08-20 ENCOUNTER — Ambulatory Visit (INDEPENDENT_AMBULATORY_CARE_PROVIDER_SITE_OTHER): Payer: Medicare PPO | Admitting: Nurse Practitioner

## 2018-08-20 ENCOUNTER — Encounter: Payer: Self-pay | Admitting: Nurse Practitioner

## 2018-08-20 VITALS — BP 120/80 | HR 58 | Ht 64.0 in | Wt 143.8 lb

## 2018-08-20 DIAGNOSIS — Z79899 Other long term (current) drug therapy: Secondary | ICD-10-CM | POA: Diagnosis not present

## 2018-08-20 DIAGNOSIS — I480 Paroxysmal atrial fibrillation: Secondary | ICD-10-CM | POA: Diagnosis not present

## 2018-08-20 NOTE — Progress Notes (Signed)
CARDIOLOGY OFFICE NOTE  Date:  08/20/2018    Molly Tucker Date of Birth: [birth year] Medical Record #[id number]  PCP:  Binnie Rail, MD  Cardiologist:  Ree Shay    Chief Complaint  Patient presents with  . Atrial Fibrillation    Follow up visit - seen for [name]    History of Present Illness: Molly Tucker is a 84 y.o. female who presents today for a follow up visit. Seen for [name].  She has a history of PAF, HTN, HLD, and osteoporosis. She has had prior cardioversions back to NSR - with improvement in symptoms. Cardioverted in June of 2016, February of 2017 and lastly in March of 2018.   Last seen back in March of 2019.    Comes in today. Herewith [name]. She is doing well. They have been to the beach this past summer for a family vacation - this has been about a 50+ year tradition. She feels good. Her heart "feels calm today". She has had some irregular beating that's not bothersome. No chest pain. Breathing is good. She feels good on her medicines and has no real concerns.   Past Medical History:  Diagnosis Date  . Atrial tachycardia ([place])   . Carotid bruit 2005   right, carotid doppler: <39% occlusion  . Hyperlipidemia    LDL goal =<120  . Hyperplastic colonic polyp 2006   Dr. Carlean Purl, due 2016  . Hypertension   . Osteoporosis    S/P  biphosphonates x 5 yrs  . Persistent atrial fibrillation     Past Surgical History:  Procedure Laterality Date  . CARDIOVERSION N/A 03/31/2015   Procedure: CARDIOVERSION;  Surgeon: Sanda Klein, MD;  Location: [facility] ENDOSCOPY;  Service: Cardiovascular;  Laterality: N/A;  . CARDIOVERSION N/A 12/21/2015   Procedure: CARDIOVERSION;  Surgeon: Lelon Perla, MD;  Location: [facility] ENDOSCOPY;  Service: Cardiovascular;  Laterality: N/A;  . CARDIOVERSION N/A 01/09/2017   Procedure: CARDIOVERSION;  Surgeon: Jerline Pain, MD;  Location: [facility];  Service: Cardiovascular;  Laterality: N/A;  . COLONOSCOPY W/  POLYPECTOMY  2006  . DILATION AND CURETTAGE OF UTERUS    . MOUTH SURGERY  2009-2010   implants, Dr. Marcelyn Ditty     Medications: Current Meds  Medication Sig  . acetaminophen (TYLENOL) 500 MG tablet Take 500 mg by mouth 2 (two) times daily as needed (arthritis pain).  Marland Kitchen ELIQUIS 5 MG TABS tablet TAKE 1 TABLET TWICE DAILY  . metoprolol succinate (TOPROL-XL) 50 MG 24 hr tablet TAKE 1 TABLET DAILY WITH OR IMMEDIATELY FOLLOWING A MEAL.  . Multiple Vitamin (MULTIVITAMIN) tablet Take 1 tablet by mouth daily.     Allergies: No Known Allergies  Social History: The patient  reports that she quit smoking about 41 years ago. She has never used smokeless tobacco. She reports that she does not drink alcohol or use drugs.   Family History: The patient's family history includes Arthritis in her unknown relative; Cancer in her paternal aunt and sister; Coronary artery disease in her father and sister.   Review of Systems: Please see the history of present illness.   Otherwise, the review of systems is positive for none.   All other systems are reviewed and negative.   Physical Exam: VS:  BP 120/80 (BP Location: Left Arm, Patient Position: Sitting, Cuff Size: Normal)   Pulse (!) 58   Ht 5\' 4"  (1.626 m)   Wt 143 lb 12.8 oz (65.2 kg)  SpO2 98% Comment: at rest  BMI 24.68 kg/m  .  BMI Body mass index is 24.68 kg/m.  Wt Readings from Last 3 Encounters:  08/20/18 143 lb 12.8 oz (65.2 kg)  04/19/18 141 lb (64 kg)  01/08/18 144 lb 6.4 oz (65.5 kg)    General: Pleasant. She looks younger than her stated age. Alert and in no acute distress.   HEENT: Normal.  Neck: Supple, no JVD, carotid bruits, or masses noted.  Cardiac: Regular rate and rhythm. No murmurs, rubs, or gallops. No edema.  Respiratory:  Lungs are clear to auscultation bilaterally with normal work of breathing.  GI: Soft and nontender.  MS: No deformity or atrophy. Gait and ROM intact.  Skin: Warm and dry. Color is normal.    Neuro:  Strength and sensation are intact and no gross focal deficits noted.  Psych: Alert, appropriate and with normal affect.   LABORATORY DATA:  EKG:  EKG is not ordered today.  Lab Results  Component Value Date   WBC 5.4 01/08/2018   HGB 13.6 01/08/2018   HCT 41.2 01/08/2018   PLT 201 01/08/2018   GLUCOSE 87 01/08/2018   CHOL 185 04/06/2017   TRIG 79.0 04/06/2017   HDL 57.00 04/06/2017   LDLCALC 112 (H) 04/06/2017   ALT 15 04/06/2017   AST 18 04/06/2017   NA 141 01/08/2018   K 4.8 01/08/2018   CL 102 01/08/2018   CREATININE 0.74 01/08/2018   BUN 17 01/08/2018   CO2 26 01/08/2018   TSH 2.04 04/06/2017   INR 1.0 01/01/2017   HGBA1C 5.8 12/22/2009     BNP (last 3 results) No results for input(s): BNP in the last 8760 hours.  ProBNP (last 3 results) No results for input(s): PROBNP in the last 8760 hours.   Other Studies Reviewed Today:  Echo Study Conclusions 02/2015  - Left ventricle: The cavity size was normal. Wall thickness was normal. Systolic function was normal. The estimated ejection fraction was in the range of 55% to 60%.Wall motion was normal; there were no regional wall motion abnormalities. - Right ventricle: The cavity size was mildly dilated. - Right atrium: The atrium was mildly dilated. - Atrial septum: No defect or patent foramen ovale was identified. - Tricuspid valve: There was moderate regurgitation.   Assessment/Plan:  1. PAF - has now had cardioversion x 3 -last back in March of 2018. She has remained in NSR since. I talked with [name] previously regarding AAD therapy - holding on this option for now. She is doing well. No changes made today.   2. Chronic anticoagulation with Eliquis - No bleeding/excessive bruising. Lab today.   3. Probable diastolic HF -when she has AF - this is not an issue as long as she is able to maintain NSR.   Current medicines are reviewed with the patient today.  The patient does not  have concerns regarding medicines other than what has been noted above.  The following changes have been made:  See above.  Labs/ tests ordered today include:    Orders Placed This Encounter  Procedures  . Basic metabolic panel  . CBC     Disposition:   FU with me in 6 months.   Patient is agreeable to this plan and will call if any problems develop in the interim.   SignedTruitt Merle, NP  08/20/2018 2:53 PM  [facility] [address] [place], [place]  [address] Phone: [phone] Fax: [phone]        

## 2018-08-20 NOTE — Patient Instructions (Addendum)
We will be checking the following labs today - BMET & CBC  If you have labs (blood work) drawn today and your tests are completely normal, you will receive your results only by: Marland Kitchen MyChart Message (if you have MyChart) OR . A paper copy in the mail If you have any lab test that is abnormal or we need to change your treatment, we will call you to review the results.   Medication Instructions:    Continue with your current medicines.    If you need a refill on your cardiac medications before your next appointment, please call your pharmacy.     Testing/Procedures To Be Arranged:  N/A  Follow-Up:   See me in 6 months    At [facility], you and your health needs are our priority.  As part of our continuing mission to provide you with exceptional heart care, we have created designated Provider Care Teams.  These Care Teams include your primary Cardiologist (physician) and Advanced Practice Providers (APPs -  Physician Assistants and Nurse Practitioners) who all work together to provide you with the care you need, when you need it.  Special Instructions:  . Keep up the good work.   Call the [place] office at [phone] if you have any questions, problems or concerns.

## 2018-08-21 LAB — BASIC METABOLIC PANEL
BUN/Creatinine Ratio: 20 (ref 12–28)
BUN: 17 mg/dL (ref 8–27)
CO2: 26 mmol/L (ref 20–29)
Calcium: 9.4 mg/dL (ref 8.7–10.3)
Chloride: 104 mmol/L (ref 96–106)
Creatinine, Ser: 0.85 mg/dL (ref 0.57–1.00)
GFR calc Af Amer: 73 mL/min/{1.73_m2} (ref >59)
GFR calc non Af Amer: 63 mL/min/{1.73_m2} (ref >59)
Glucose: 77 mg/dL (ref 65–99)
Potassium: 4.8 mmol/L (ref 3.5–5.2)
Sodium: 143 mmol/L (ref 134–144)

## 2018-08-21 LAB — CBC
Hematocrit: 40.7 % (ref 34.0–46.6)
Hemoglobin: 13.5 g/dL (ref 11.1–15.9)
MCH: 29 pg (ref 26.6–33.0)
MCHC: 33.2 g/dL (ref 31.5–35.7)
MCV: 88 fL (ref 79–97)
Platelets: 206 10*3/uL (ref 150–450)
RBC: 4.65 x10E6/uL (ref 3.77–5.28)
RDW: 12.9 % (ref 12.3–15.4)
WBC: 5.1 10*3/uL (ref 3.4–10.8)

## 2018-10-07 ENCOUNTER — Other Ambulatory Visit: Payer: Self-pay | Admitting: Nurse Practitioner

## 2018-10-07 DIAGNOSIS — Z7901 Long term (current) use of anticoagulants: Secondary | ICD-10-CM

## 2018-10-07 DIAGNOSIS — I480 Paroxysmal atrial fibrillation: Secondary | ICD-10-CM

## 2018-10-29 ENCOUNTER — Encounter: Payer: Self-pay | Admitting: Internal Medicine

## 2018-10-29 DIAGNOSIS — R300 Dysuria: Secondary | ICD-10-CM

## 2018-10-31 ENCOUNTER — Other Ambulatory Visit (INDEPENDENT_AMBULATORY_CARE_PROVIDER_SITE_OTHER): Payer: Medicare PPO

## 2018-10-31 DIAGNOSIS — R300 Dysuria: Secondary | ICD-10-CM

## 2018-10-31 LAB — URINALYSIS, ROUTINE W REFLEX MICROSCOPIC
Bilirubin Urine: NEGATIVE
KETONES UR: NEGATIVE
Nitrite: NEGATIVE
SPECIFIC GRAVITY, URINE: 1.02 (ref 1.000–1.030)
Total Protein, Urine: 30 — AB
URINE GLUCOSE: NEGATIVE
Urobilinogen, UA: 0.2 (ref 0.0–1.0)
pH: 5.5 (ref 5.0–8.0)

## 2018-10-31 MED ORDER — CEPHALEXIN 500 MG PO CAPS: 500.0000 mg | ORAL_CAPSULE | Freq: Two times a day (BID) | ORAL | 0 refills | Status: DC

## 2018-11-02 LAB — URINE CULTURE
MICRO NUMBER: 2904
SPECIMEN QUALITY: ADEQUATE

## 2018-11-04 ENCOUNTER — Other Ambulatory Visit: Payer: Self-pay | Admitting: Internal Medicine

## 2018-11-04 ENCOUNTER — Other Ambulatory Visit: Payer: Self-pay | Admitting: *Deleted

## 2018-11-04 MED ORDER — APIXABAN 5 MG PO TABS: 5.0000 mg | ORAL_TABLET | Freq: Two times a day (BID) | ORAL | 0 refills | Status: DC

## 2018-11-04 MED ORDER — AMOXICILLIN-POT CLAVULANATE 875-125 MG PO TABS: 1.0000 | ORAL_TABLET | Freq: Two times a day (BID) | ORAL | 0 refills | Status: AC

## 2018-11-04 NOTE — Telephone Encounter (Signed)
Eliquis 5mg  refill request received; pt is 85 yrs old, wt-65.2kg, Crea-0.85 on 08/20/18, last seen [name] on 08/20/18; will send in refill to requested pharmacy.

## 2019-01-23 DIAGNOSIS — Z7901 Long term (current) use of anticoagulants: Secondary | ICD-10-CM

## 2019-01-23 DIAGNOSIS — I480 Paroxysmal atrial fibrillation: Secondary | ICD-10-CM

## 2019-01-23 MED ORDER — METOPROLOL SUCCINATE ER 50 MG PO TB24: ORAL_TABLET | ORAL | 1 refills | Status: DC

## 2019-01-23 NOTE — Telephone Encounter (Signed)
Pt's medication was sent to pt's pharmacy as requested. Confirmation received.  °

## 2019-02-02 ENCOUNTER — Other Ambulatory Visit: Payer: Self-pay | Admitting: Internal Medicine

## 2019-02-03 NOTE — Telephone Encounter (Signed)
Age 85, weight 65kg, SCr 0.85 on 08/20/18

## 2019-02-04 ENCOUNTER — Telehealth: Payer: Self-pay | Admitting: Nurse Practitioner

## 2019-02-04 NOTE — Telephone Encounter (Signed)
   Primary Cardiologist:  Servando Snare  Patient contacted.  History reviewed.  No symptoms to suggest any unstable cardiac conditions.  Based on discussion, with current pandemic situation, we will be postponing both her and Molly Tucker's appointments for later this month and move to early August.  If symptoms change for either one of them, she has been instructed to contact our office.   Please have new appointment card mailed for both her and Molly Tucker for early August.   Molly Paulding, NP  02/04/2019 4:25 PM         .

## 2019-02-25 ENCOUNTER — Ambulatory Visit: Payer: Medicare PPO | Admitting: Nurse Practitioner

## 2019-06-15 ENCOUNTER — Other Ambulatory Visit: Payer: Self-pay | Admitting: Internal Medicine

## 2019-06-15 ENCOUNTER — Other Ambulatory Visit: Payer: Self-pay | Admitting: Nurse Practitioner

## 2019-06-15 DIAGNOSIS — Z7901 Long term (current) use of anticoagulants: Secondary | ICD-10-CM

## 2019-06-15 DIAGNOSIS — I480 Paroxysmal atrial fibrillation: Secondary | ICD-10-CM

## 2019-06-16 NOTE — Telephone Encounter (Signed)
78f 65.2kg Scr 0.85 08/20/18 Lovw/gerdhardt 08/20/18

## 2019-06-21 NOTE — H&P (View-Only) (Signed)
CARDIOLOGY OFFICE NOTE  Date:  06/24/2019    Molly Tucker Date of Birth: 15-Apr-1932 Medical Record [id number]  PCP:  Molly Rail, MD  Cardiologist:  Molly Tucker  Chief Complaint  Patient presents with  . Follow-up    History of Present Illness: Molly Tucker is a 85 y.o. female who presents today for a 10 month check. Seen for Dr. Caryl Tucker. Primarily follows with me.   She has a history of PAF, HTN, HLD, and osteoporosis. She has had prior cardioversions back to NSR - with improvement in symptoms. Cardioverted in June of 2016, February of 2017 and lastly in March of 2018.   Last seen back in October of 2019. Was doing ok with no cardiac concerns.   The patient does not have symptoms concerning for COVID-19 infection (fever, chills, cough, or new shortness of breath).   Tucker in today. [name] - I am seeing him as well. She notes she has done ok up until this past Saturday - she has recurrent AF. She is not short of breath. No more swelling than usual. She really does not wish to go to the hospital - she does not wish to have cardioversion given the pandemic state. She notes she feels the rapid heart beat when she exerts in her neck - otherwise feels ok. No symptoms at rest. Remains on Eliquis - she missed one dose about 2 weeks ago - otherwise, no missed doses.   Past Medical History:  Diagnosis Date  . Atrial tachycardia ([place])   . Carotid bruit 2005   right, carotid doppler: <39% occlusion  . Hyperlipidemia    LDL goal =<120  . Hyperplastic colonic polyp 2006   Dr. Carlean Tucker, due 2016  . Hypertension   . Osteoporosis    S/P  biphosphonates x 5 yrs  . Persistent atrial fibrillation     Past Surgical History:  Procedure Laterality Date  . CARDIOVERSION N/A 03/31/2015   Procedure: CARDIOVERSION;  Surgeon: Molly Klein, MD;  Location: [facility] ENDOSCOPY;  Service: Cardiovascular;  Laterality: N/A;  . CARDIOVERSION N/A 12/21/2015   Procedure:  CARDIOVERSION;  Surgeon: Molly Perla, MD;  Location: [facility] ENDOSCOPY;  Service: Cardiovascular;  Laterality: N/A;  . CARDIOVERSION N/A 01/09/2017   Procedure: CARDIOVERSION;  Surgeon: Molly Pain, MD;  Location: [facility];  Service: Cardiovascular;  Laterality: N/A;  . COLONOSCOPY W/ POLYPECTOMY  2006  . DILATION AND CURETTAGE OF UTERUS    . MOUTH SURGERY  2009-2010   implants, Dr. Marcelyn Tucker     Medications: Current Meds  Medication Sig  . acetaminophen (TYLENOL) 500 MG tablet Take 500 mg by mouth 2 (two) times daily as needed (arthritis Tucker).  Marland Kitchen ELIQUIS 5 MG TABS tablet TAKE 1 TABLET BY MOUTH  TWICE DAILY  . metoprolol succinate (TOPROL-XL) 50 MG 24 hr tablet TAKE 1 TABLET BY MOUTH  DAILY WITH OR IMMEDIATELY  FOLLOWING A MEAL.     Allergies: No Known Allergies  Social History: The patient  reports that she quit smoking about 42 years ago. She has never used smokeless tobacco. She reports that she does not drink alcohol or use drugs.   Family History: The patient's family history includes Arthritis in her unknown relative; Cancer in her paternal aunt and sister; Coronary artery disease in her father and sister.   Review of Systems: Please see the history of present illness.   All other systems are reviewed and negative.   Physical Exam:  VS:  BP 122/80 (BP Location: Left Arm, Patient Position: Sitting, Cuff Size: Normal)   Pulse 90   Ht 5\' 4"  (1.626 m)   Wt 143 lb (64.9 kg)   BMI 24.55 kg/m  .  BMI Body mass index is 24.55 kg/m.  Wt Readings from Last 3 Encounters:  06/24/19 143 lb (64.9 kg)  08/20/18 143 lb 12.8 oz (65.2 kg)  04/19/18 141 lb (64 kg)    General: Pleasant. Alert and in no acute distress.  She looks younger than her stated age.  HEENT: Normal.  Neck: Supple, no JVD, carotid bruits, or masses noted.  Cardiac: Irregular irregular rhythm. Rate is fair.  Trace edema.  Respiratory:  Lungs are clear to auscultation bilaterally with normal work of  breathing.  GI: Soft and nontender.  MS: No deformity or atrophy. Gait and ROM intact.  Skin: Warm and dry. Color is normal.  Neuro:  Strength and sensation are intact and no gross focal deficits noted.  Psych: Alert, appropriate and with normal affect.   LABORATORY DATA:  EKG:  EKG is ordered today. This demonstrates AF with VR of 90. Reviewed with Dr. Caryl Tucker in the office this afternoon.   Lab Results  Component Value Date   WBC 5.1 08/20/2018   HGB 13.5 08/20/2018   HCT 40.7 08/20/2018   PLT 206 08/20/2018   GLUCOSE 77 08/20/2018   CHOL 185 04/06/2017   TRIG 79.0 04/06/2017   HDL 57.00 04/06/2017   LDLCALC 112 (H) 04/06/2017   ALT 15 04/06/2017   AST 18 04/06/2017   NA 143 08/20/2018   K 4.8 08/20/2018   CL 104 08/20/2018   CREATININE 0.85 08/20/2018   BUN 17 08/20/2018   CO2 26 08/20/2018   TSH 2.04 04/06/2017   INR 1.0 01/01/2017   HGBA1C 5.8 12/22/2009     BNP (last 3 results) No results for input(s): BNP in the last 8760 hours.  ProBNP (last 3 results) No results for input(s): PROBNP in the last 8760 hours.   Other Studies Reviewed Today:  Echo Study Conclusions 02/2015  - Left ventricle: The cavity size was normal. Wall thickness was normal. Systolic function was normal. The estimated ejection fraction was in the range of 55% to 60%.Wall motion was normal; there were no regional wall motion abnormalities. - Right ventricle: The cavity size was mildly dilated. - Right atrium: The atrium was mildly dilated. - Atrial septum: No defect or patent foramen ovale was identified. - Tricuspid valve: There was moderate regurgitation.   Assessment/Plan:  1. PAF - has now had cardioversion x 3 -last back in March of 2018.She is back in AF today - she is only mildly symptomatic and only with exertion. She does not wish to go to the hospital or have cardioversion at this time. She is asking about other options. Discussed with Dr. Caryl Tucker here in the  office - he has recommended a one time dose of Flecainide 300mg  x 1. She has no known CAD. If she fails to convert- we will rate control - she is to call me tomorrow with an update. We can increase the Toprol as needed. She remains on anticoagulation which needs to be continued.   2. Chronic anticoagulation with Eliquis -No problems noted - she missed one dose about 2 weeks only - otherwise, no missed doses. Lab today.   3. Probable diastolic HF -when she has AF - she is back in AF - so far she looks compensated. She understands that we  need go rate control and to be aware of worsening swelling, shortness of breath, etc.   4. COVID-19 Education: The signs and symptoms of COVID-19 were discussed with the patient and how to seek care for testing (follow up with PCP or arrange E-visit).  The importance of social distancing, staying at home, hand hygiene and wearing a mask when out in public were discussed today.  Current medicines are reviewed with the patient today.  The patient does not have concerns regarding medicines other than what has been noted above.  The following changes have been made:  See above.  Labs/ tests ordered today include:    Orders Placed This Encounter  Procedures  . Basic metabolic panel  . CBC  . Hepatic function panel  . Lipid panel  . TSH  . EKG 12-Lead     Disposition:   FU with me tentatively in 4 months but she will be in touch with [place] tomorrow to give an update. If we have to increase the Toprol, we will need to see her back sooner.   Patient is agreeable to this plan and will call if any problems develop in the interim.   SignedTruitt Merle, NP  06/24/2019 3:02 PM  [facility] [address] [place], [place]  [address] Phone: [phone] Fax: [phone]       Addendum: 06/30/19  She has failed to convert with the dose of Flecainide. We have increased her Toprol - she remains symptomatic with  her AF. She is now wanting to proceed next week for cardioversion. Scheduled for Wednesday September 9 at 10 AM with Dr. Sallyanne Kuster. She is to continue her Eliquis as well. We will do lab here on Friday at [facility] followed by COVID testing at [facility]. The procedure has been reviewed with Reino Bellis and she is willing to proceed. We have her a follow up visit scheduled with me. She also understands that her dose of Toprol will most likely be cut back after cardioversion if she is bradycardic.   Burtis Junes, RN, [facility] [address] [place], [place]  [address] [phone]

## 2019-06-21 NOTE — Progress Notes (Addendum)
CARDIOLOGY OFFICE NOTE  Date:  06/24/2019    Molly Tucker Date of Birth: 1932/03/08 Medical Record [id number]  PCP:  Molly Rail, MD  Cardiologist:  Molly Tucker  Chief Complaint  Patient presents with  . Follow-up    History of Present Illness: Molly Tucker is a 85 y.o. female who presents today for a 10 month check. Seen for Molly Tucker. Primarily follows with me.   She has a history of PAF, HTN, HLD, and osteoporosis. She has had prior cardioversions back to NSR - with improvement in symptoms. Cardioverted in June of 2016, February of 2017 and lastly in March of 2018.   Last seen back in October of 2019. Was doing ok with no cardiac concerns.   The patient does not have symptoms concerning for COVID-19 infection (fever, chills, cough, or new shortness of breath).   Tucker in today. [name] - I am seeing him as well. She notes she has done ok up until this past Saturday - she has recurrent AF. She is not short of breath. No more swelling than usual. She really does not wish to go to the hospital - she does not wish to have cardioversion given the pandemic state. She notes she feels the rapid heart beat when she exerts in her neck - otherwise feels ok. No symptoms at rest. Remains on Eliquis - she missed one dose about 2 weeks ago - otherwise, no missed doses.   Past Medical History:  Diagnosis Date  . Atrial tachycardia ([place])   . Carotid bruit 2005   right, carotid doppler: <39% occlusion  . Hyperlipidemia    LDL goal =<120  . Hyperplastic colonic polyp 2006   Dr. Carlean Tucker, due 2016  . Hypertension   . Osteoporosis    S/P  biphosphonates x 5 yrs  . Persistent atrial fibrillation     Past Surgical History:  Procedure Laterality Date  . CARDIOVERSION N/A 03/31/2015   Procedure: CARDIOVERSION;  Surgeon: Molly Klein, MD;  Location: [facility] ENDOSCOPY;  Service: Cardiovascular;  Laterality: N/A;  . CARDIOVERSION N/A 12/21/2015   Procedure:  CARDIOVERSION;  Surgeon: Molly Perla, MD;  Location: [facility] ENDOSCOPY;  Service: Cardiovascular;  Laterality: N/A;  . CARDIOVERSION N/A 01/09/2017   Procedure: CARDIOVERSION;  Surgeon: Molly Pain, MD;  Location: [place];  Service: Cardiovascular;  Laterality: N/A;  . COLONOSCOPY W/ POLYPECTOMY  2006  . DILATION AND CURETTAGE OF UTERUS    . MOUTH SURGERY  2009-2010   implants, Dr. Marcelyn Tucker     Medications: Current Meds  Medication Sig  . acetaminophen (TYLENOL) 500 MG tablet Take 500 mg by mouth 2 (two) times daily as needed (arthritis Tucker).  Marland Kitchen ELIQUIS 5 MG TABS tablet TAKE 1 TABLET BY MOUTH  TWICE DAILY  . metoprolol succinate (TOPROL-XL) 50 MG 24 hr tablet TAKE 1 TABLET BY MOUTH  DAILY WITH OR IMMEDIATELY  FOLLOWING A MEAL.     Allergies: No Known Allergies  Social History: The patient  reports that she quit smoking about 42 years ago. She has never used smokeless tobacco. She reports that she does not drink alcohol or use drugs.   Family History: The patient's family history includes Arthritis in her unknown relative; Cancer in her paternal aunt and sister; Coronary artery disease in her father and sister.   Review of Systems: Please see the history of present illness.   All other systems are reviewed and negative.   Physical Exam:  VS:  BP 122/80 (BP Location: Left Arm, Patient Position: Sitting, Cuff Size: Normal)   Pulse 90   Ht 5\' 4"  (1.626 m)   Wt 143 lb (64.9 kg)   BMI 24.55 kg/m  .  BMI Body mass index is 24.55 kg/m.  Wt Readings from Last 3 Encounters:  06/24/19 143 lb (64.9 kg)  08/20/18 143 lb 12.8 oz (65.2 kg)  04/19/18 141 lb (64 kg)    General: Pleasant. Alert and in no acute distress.  She looks younger than her stated age.  HEENT: Normal.  Neck: Supple, no JVD, carotid bruits, or masses noted.  Cardiac: Irregular irregular rhythm. Rate is fair.  Trace edema.  Respiratory:  Lungs are clear to auscultation bilaterally with normal work of  breathing.  GI: Soft and nontender.  MS: No deformity or atrophy. Gait and ROM intact.  Skin: Warm and dry. Color is normal.  Neuro:  Strength and sensation are intact and no gross focal deficits noted.  Psych: Alert, appropriate and with normal affect.   LABORATORY DATA:  EKG:  EKG is ordered today. This demonstrates AF with VR of 90. Reviewed with Molly Tucker in the office this afternoon.   Lab Results  Component Value Date   WBC 5.1 08/20/2018   HGB 13.5 08/20/2018   HCT 40.7 08/20/2018   PLT 206 08/20/2018   GLUCOSE 77 08/20/2018   CHOL 185 04/06/2017   TRIG 79.0 04/06/2017   HDL 57.00 04/06/2017   LDLCALC 112 (H) 04/06/2017   ALT 15 04/06/2017   AST 18 04/06/2017   NA 143 08/20/2018   K 4.8 08/20/2018   CL 104 08/20/2018   CREATININE 0.85 08/20/2018   BUN 17 08/20/2018   CO2 26 08/20/2018   TSH 2.04 04/06/2017   INR 1.0 01/01/2017   HGBA1C 5.8 12/22/2009     BNP (last 3 results) No results for input(s): BNP in the last 8760 hours.  ProBNP (last 3 results) No results for input(s): PROBNP in the last 8760 hours.   Other Studies Reviewed Today:  Echo Study Conclusions 02/2015  - Left ventricle: The cavity size was normal. Wall thickness was normal. Systolic function was normal. The estimated ejection fraction was in the range of 55% to 60%.Wall motion was normal; there were no regional wall motion abnormalities. - Right ventricle: The cavity size was mildly dilated. - Right atrium: The atrium was mildly dilated. - Atrial septum: No defect or patent foramen ovale was identified. - Tricuspid valve: There was moderate regurgitation.   Assessment/Plan:  1. PAF - has now had cardioversion x 3 -last back in March of 2018.She is back in AF today - she is only mildly symptomatic and only with exertion. She does not wish to go to the hospital or have cardioversion at this time. She is asking about other options. Discussed with Molly Tucker here in the  office - he has recommended a one time dose of Flecainide 300mg  x 1. She has no known CAD. If she fails to convert- we will rate control - she is to call me tomorrow with an update. We can increase the Toprol as needed. She remains on anticoagulation which needs to be continued.   2. Chronic anticoagulation with Eliquis -No problems noted - she missed one dose about 2 weeks only - otherwise, no missed doses. Lab today.   3. Probable diastolic HF -when she has AF - she is back in AF - so far she looks compensated. She understands that we  need go rate control and to be aware of worsening swelling, shortness of breath, etc.   4. COVID-19 Education: The signs and symptoms of COVID-19 were discussed with the patient and how to seek care for testing (follow up with PCP or arrange E-visit).  The importance of social distancing, staying at home, hand hygiene and wearing a mask when out in public were discussed today.  Current medicines are reviewed with the patient today.  The patient does not have concerns regarding medicines other than what has been noted above.  The following changes have been made:  See above.  Labs/ tests ordered today include:    Orders Placed This Encounter  Procedures  . Basic metabolic panel  . CBC  . Hepatic function panel  . Lipid panel  . TSH  . EKG 12-Lead     Disposition:   FU with me tentatively in 4 months but she will be in touch with [place] tomorrow to give an update. If we have to increase the Toprol, we will need to see her back sooner.   Patient is agreeable to this plan and will call if any problems develop in the interim.   SignedTruitt Merle, NP  06/24/2019 3:02 PM  [facility] [address] [place], [place]  [address] Phone: [phone] Fax: [phone]       Addendum: 06/30/19  She has failed to convert with the dose of Flecainide. We have increased her Toprol - she remains symptomatic with  her AF. She is now wanting to proceed next week for cardioversion. Scheduled for Wednesday September 9 at 10 AM with Dr. Sallyanne Kuster. She is to continue her Eliquis as well. We will do lab here on Friday at [facility] followed by COVID testing at [facility]. The procedure has been reviewed with Reino Bellis and she is willing to proceed. We have her a follow up visit scheduled with me. She also understands that her dose of Toprol will most likely be cut back after cardioversion if she is bradycardic.   Burtis Junes, RN, [place] [address] [place], [place]  [address] [phone]

## 2019-06-24 ENCOUNTER — Ambulatory Visit (INDEPENDENT_AMBULATORY_CARE_PROVIDER_SITE_OTHER): Payer: Medicare Other | Admitting: Nurse Practitioner

## 2019-06-24 ENCOUNTER — Encounter: Payer: Self-pay | Admitting: Nurse Practitioner

## 2019-06-24 ENCOUNTER — Other Ambulatory Visit: Payer: Self-pay

## 2019-06-24 VITALS — BP 122/80 | HR 90 | Ht 64.0 in | Wt 143.0 lb

## 2019-06-24 DIAGNOSIS — Z79899 Other long term (current) drug therapy: Secondary | ICD-10-CM

## 2019-06-24 DIAGNOSIS — I10 Essential (primary) hypertension: Secondary | ICD-10-CM | POA: Diagnosis not present

## 2019-06-24 DIAGNOSIS — Z7189 Other specified counseling: Secondary | ICD-10-CM

## 2019-06-24 DIAGNOSIS — I480 Paroxysmal atrial fibrillation: Secondary | ICD-10-CM | POA: Diagnosis not present

## 2019-06-24 DIAGNOSIS — Z7901 Long term (current) use of anticoagulants: Secondary | ICD-10-CM

## 2019-06-24 DIAGNOSIS — Z9889 Other specified postprocedural states: Secondary | ICD-10-CM

## 2019-06-24 MED ORDER — FLECAINIDE ACETATE 100 MG PO TABS: 300.0000 mg | ORAL_TABLET | Freq: Once | ORAL | 0 refills | Status: DC

## 2019-06-24 NOTE — Patient Instructions (Addendum)
After Visit Summary:  We will be checking the following labs today - BMET, CBC, HPF, Lipids and TSH   Medication Instructions:    Continue with your current medicines. BUT  We will try a one time dose of Flecainide 300 mg to take tonight - only one time - this is at your pharmacy - this can put you back in rhythm.    If you need a refill on your cardiac medications before your next appointment, please call your pharmacy.     Testing/Procedures To Be Arranged:  N/A  Follow-Up:   Call me tomorrow and let me know how you are doing  See me in 4 months.     At [facility], you and your health needs are our priority.  As part of our continuing mission to provide you with exceptional heart care, we have created designated Provider Care Teams.  These Care Teams include your primary Cardiologist (physician) and Advanced Practice Providers (APPs -  Physician Assistants and Nurse Practitioners) who all work together to provide you with the care you need, when you need it.  Special Instructions:  . Stay safe, stay home, wash your hands for at least 20 seconds and wear a mask when out in public.  . It was good to talk with you today.  If you have any lightheadedness, passing out, dizziness, call 911.    Call the [place] office at [phone] if you have any questions, problems or concerns.

## 2019-06-25 LAB — BASIC METABOLIC PANEL
BUN/Creatinine Ratio: 20 (ref 12–28)
BUN: 16 mg/dL (ref 8–27)
CO2: 25 mmol/L (ref 20–29)
Calcium: 9.3 mg/dL (ref 8.7–10.3)
Chloride: 105 mmol/L (ref 96–106)
Creatinine, Ser: 0.82 mg/dL (ref 0.57–1.00)
GFR calc Af Amer: 75 mL/min/{1.73_m2} (ref >59)
GFR calc non Af Amer: 65 mL/min/{1.73_m2} (ref >59)
Glucose: 96 mg/dL (ref 65–99)
Potassium: 5.1 mmol/L (ref 3.5–5.2)
Sodium: 143 mmol/L (ref 134–144)

## 2019-06-25 LAB — HEPATIC FUNCTION PANEL
ALT: 17 IU/L (ref 0–32)
AST: 18 IU/L (ref 0–40)
Albumin: 4.1 g/dL (ref 3.6–4.6)
Alkaline Phosphatase: 45 IU/L (ref 39–117)
Bilirubin Total: 0.7 mg/dL (ref 0.0–1.2)
Bilirubin, Direct: 0.2 mg/dL (ref 0.00–0.40)
Total Protein: 6.4 g/dL (ref 6.0–8.5)

## 2019-06-25 LAB — LIPID PANEL
Chol/HDL Ratio: 3.5 ratio (ref 0.0–4.4)
Cholesterol, Total: 189 mg/dL (ref 100–199)
HDL: 54 mg/dL (ref >39)
LDL Calculated: 117 mg/dL — ABNORMAL HIGH (ref 0–99)
Triglycerides: 88 mg/dL (ref 0–149)
VLDL Cholesterol Cal: 18 mg/dL (ref 5–40)

## 2019-06-25 LAB — CBC
Hematocrit: 45.5 % (ref 34.0–46.6)
Hemoglobin: 14.6 g/dL (ref 11.1–15.9)
MCH: 29.7 pg (ref 26.6–33.0)
MCHC: 32.1 g/dL (ref 31.5–35.7)
MCV: 93 fL (ref 79–97)
Platelets: 220 10*3/uL (ref 150–450)
RBC: 4.91 x10E6/uL (ref 3.77–5.28)
RDW: 12.8 % (ref 11.7–15.4)
WBC: 5.8 10*3/uL (ref 3.4–10.8)

## 2019-06-25 LAB — TSH: TSH: 2.84 u[IU]/mL (ref 0.450–4.500)

## 2019-06-26 ENCOUNTER — Other Ambulatory Visit: Payer: Self-pay | Admitting: *Deleted

## 2019-06-26 DIAGNOSIS — Z7901 Long term (current) use of anticoagulants: Secondary | ICD-10-CM

## 2019-06-26 DIAGNOSIS — I480 Paroxysmal atrial fibrillation: Secondary | ICD-10-CM

## 2019-06-26 MED ORDER — METOPROLOL SUCCINATE ER 50 MG PO TB24: 100.0000 mg | ORAL_TABLET | Freq: Every day | ORAL | 3 refills | Status: DC

## 2019-06-26 NOTE — Telephone Encounter (Signed)
S/w pt is aware of recommendations.  Will update medication list and will call pt on Monday.

## 2019-06-30 NOTE — Addendum Note (Signed)
Addended by: Burtis Junes on: 06/30/2019 11:18 AM   Modules accepted: Orders, SmartSet

## 2019-07-04 ENCOUNTER — Other Ambulatory Visit (HOSPITAL_COMMUNITY)
Admission: RE | Admit: 2019-07-04 | Discharge: 2019-07-04 | Disposition: A | Discharge status: Home / Self Care | Payer: Medicare Other | Source: Ambulatory Visit | Attending: Cardiovascular Disease | Admitting: Cardiovascular Disease

## 2019-07-04 ENCOUNTER — Other Ambulatory Visit: Payer: Medicare Other | Admitting: *Deleted

## 2019-07-04 ENCOUNTER — Other Ambulatory Visit: Payer: Self-pay

## 2019-07-04 DIAGNOSIS — Z7901 Long term (current) use of anticoagulants: Secondary | ICD-10-CM | POA: Diagnosis not present

## 2019-07-04 DIAGNOSIS — Z20828 Contact with and (suspected) exposure to other viral communicable diseases: Secondary | ICD-10-CM | POA: Insufficient documentation

## 2019-07-04 DIAGNOSIS — Z01812 Encounter for preprocedural laboratory examination: Secondary | ICD-10-CM | POA: Diagnosis not present

## 2019-07-04 DIAGNOSIS — I480 Paroxysmal atrial fibrillation: Secondary | ICD-10-CM

## 2019-07-05 LAB — BASIC METABOLIC PANEL
BUN/Creatinine Ratio: 17 (ref 12–28)
BUN: 16 mg/dL (ref 8–27)
CO2: 23 mmol/L (ref 20–29)
Calcium: 9.1 mg/dL (ref 8.7–10.3)
Chloride: 104 mmol/L (ref 96–106)
Creatinine, Ser: 0.95 mg/dL (ref 0.57–1.00)
GFR calc Af Amer: 63 mL/min/{1.73_m2} (ref >59)
GFR calc non Af Amer: 55 mL/min/{1.73_m2} — ABNORMAL LOW (ref >59)
Glucose: 90 mg/dL (ref 65–99)
Potassium: 4.8 mmol/L (ref 3.5–5.2)
Sodium: 142 mmol/L (ref 134–144)

## 2019-07-05 LAB — CBC
Hematocrit: 43.5 % (ref 34.0–46.6)
Hemoglobin: 14 g/dL (ref 11.1–15.9)
MCH: 29.9 pg (ref 26.6–33.0)
MCHC: 32.2 g/dL (ref 31.5–35.7)
MCV: 93 fL (ref 79–97)
Platelets: 210 10*3/uL (ref 150–450)
RBC: 4.68 x10E6/uL (ref 3.77–5.28)
RDW: 12.7 % (ref 11.7–15.4)
WBC: 6.2 10*3/uL (ref 3.4–10.8)

## 2019-07-05 LAB — PT AND PTT
INR: 1.1 (ref 0.8–1.2)
Prothrombin Time: 11.4 s (ref 9.1–12.0)
aPTT: 29 s (ref 24–33)

## 2019-07-05 LAB — NOVEL CORONAVIRUS, NAA (HOSP ORDER, SEND-OUT TO REF LAB; TAT 18-24 HRS): SARS-CoV-2, NAA: NOT DETECTED

## 2019-07-08 NOTE — Progress Notes (Signed)
This RN called pt and left VM for pt reminder to stay quarantined since covid test, to be at the hospital by 9am and visitor policy also noted.

## 2019-07-09 ENCOUNTER — Telehealth: Payer: Self-pay | Admitting: *Deleted

## 2019-07-09 ENCOUNTER — Ambulatory Visit (HOSPITAL_COMMUNITY)
Admission: RE | Admit: 2019-07-09 | Discharge: 2019-07-09 | Disposition: A | Discharge status: Home / Self Care | Payer: Medicare Other | Attending: Cardiovascular Disease | Admitting: Cardiovascular Disease

## 2019-07-09 ENCOUNTER — Encounter (HOSPITAL_COMMUNITY)
Admission: RE | Disposition: A | Discharge status: Home / Self Care | Payer: Self-pay | Source: Home / Self Care | Attending: Cardiovascular Disease

## 2019-07-09 ENCOUNTER — Ambulatory Visit (HOSPITAL_COMMUNITY): Payer: Medicare Other | Admitting: Anesthesiology

## 2019-07-09 ENCOUNTER — Encounter (HOSPITAL_COMMUNITY): Payer: Self-pay | Admitting: *Deleted

## 2019-07-09 ENCOUNTER — Other Ambulatory Visit: Payer: Self-pay

## 2019-07-09 DIAGNOSIS — I4819 Other persistent atrial fibrillation: Secondary | ICD-10-CM

## 2019-07-09 DIAGNOSIS — M810 Age-related osteoporosis without current pathological fracture: Secondary | ICD-10-CM | POA: Diagnosis not present

## 2019-07-09 DIAGNOSIS — I10 Essential (primary) hypertension: Secondary | ICD-10-CM | POA: Diagnosis not present

## 2019-07-09 DIAGNOSIS — Z87891 Personal history of nicotine dependence: Secondary | ICD-10-CM | POA: Insufficient documentation

## 2019-07-09 DIAGNOSIS — I480 Paroxysmal atrial fibrillation: Secondary | ICD-10-CM

## 2019-07-09 DIAGNOSIS — Z79899 Other long term (current) drug therapy: Secondary | ICD-10-CM | POA: Diagnosis not present

## 2019-07-09 DIAGNOSIS — Z7901 Long term (current) use of anticoagulants: Secondary | ICD-10-CM | POA: Insufficient documentation

## 2019-07-09 DIAGNOSIS — E785 Hyperlipidemia, unspecified: Secondary | ICD-10-CM | POA: Diagnosis not present

## 2019-07-09 HISTORY — PX: CARDIOVERSION: SHX1299

## 2019-07-09 SURGERY — CARDIOVERSION
Anesthesia: General

## 2019-07-09 MED ORDER — LIDOCAINE 2% (20 MG/ML) 5 ML SYRINGE
INTRAMUSCULAR | Status: DC | PRN
  Administered 2019-07-09: 60 mg via INTRAVENOUS

## 2019-07-09 MED ORDER — SODIUM CHLORIDE 0.9 % IV SOLN
INTRAVENOUS | Status: DC
  Administered 2019-07-09: 09:00:00 via INTRAVENOUS

## 2019-07-09 MED ORDER — PROPOFOL 10 MG/ML IV BOLUS
INTRAVENOUS | Status: DC | PRN
  Administered 2019-07-09: 60 mg via INTRAVENOUS

## 2019-07-09 MED ORDER — PHENYLEPHRINE 40 MCG/ML (10ML) SYRINGE FOR IV PUSH (FOR BLOOD PRESSURE SUPPORT)
PREFILLED_SYRINGE | INTRAVENOUS | Status: DC | PRN
  Administered 2019-07-09 (×3): 80 ug via INTRAVENOUS

## 2019-07-09 NOTE — Telephone Encounter (Signed)
S/w Molly Tucker is aware of Molly Tucker's recommendation's Molly Tucker is back in NSR. Molly Tucker  Stated just does not feel well but this happened last time.  Molly Tucker is going to take a nap.  Stated will call next week to check on status of Molly Tucker. Will send to Molly Tucker to [place].

## 2019-07-09 NOTE — Anesthesia Preprocedure Evaluation (Signed)
Anesthesia Evaluation  Patient identified by MRN, date of birth, ID band Patient awake    Reviewed: Allergy & Precautions, NPO status , Patient's Chart, lab work & pertinent test results  Airway Mallampati: II  TM Distance: >3 FB Neck ROM: Full    Dental no notable dental hx. (+) Teeth Intact   Pulmonary former smoker,    Pulmonary exam normal breath sounds clear to auscultation       Cardiovascular Exercise Tolerance: Good hypertension, Pt. on medications Normal cardiovascular exam Rhythm:Regular Rate:Normal     Neuro/Psych negative neurological ROS  negative psych ROS   GI/Hepatic negative GI ROS, Neg liver ROS,   Endo/Other  negative endocrine ROS  Renal/GU negative Renal ROS     Musculoskeletal   Abdominal   Peds  Hematology negative hematology ROS (+)   Anesthesia Other Findings   Reproductive/Obstetrics                             Anesthesia Physical Anesthesia Plan  ASA: III  Anesthesia Plan: General   Post-op Pain Management:    Induction:   PONV Risk Score and Plan: 2 and Treatment may vary due to age or medical condition and Ondansetron  Airway Management Planned: Mask  Additional Equipment:   Intra-op Plan:   Post-operative Plan:   Informed Consent: I have reviewed the patients History and Physical, chart, labs and discussed the procedure including the risks, benefits and alternatives for the proposed anesthesia with the patient or authorized representative who has indicated his/her understanding and acceptance.     Dental advisory given  Plan Discussed with:   Anesthesia Plan Comments:         Anesthesia Quick Evaluation

## 2019-07-09 NOTE — Anesthesia Postprocedure Evaluation (Signed)
Anesthesia Post Note  Patient: Molly Tucker  Procedure(s) Performed: CARDIOVERSION (N/A )     Patient location during evaluation: Endoscopy Anesthesia Type: General Level of consciousness: awake and alert Pain management: pain level controlled Vital Signs Assessment: post-procedure vital signs reviewed and stable Respiratory status: spontaneous breathing, nonlabored ventilation, respiratory function stable and patient connected to nasal cannula oxygen Cardiovascular status: blood pressure returned to baseline and stable Postop Assessment: no apparent nausea or vomiting Anesthetic complications: no    Last Vitals:  Vitals:   07/09/19 1025 07/09/19 1030  BP: 104/61 (!) 106/58  Pulse: (!) 45 (!) 49  Resp: 16 16  Temp:    SpO2: 94% 96%    Last Pain:  Vitals:   07/09/19 1010  TempSrc:   PainSc: 0-No pain                 Barnet Glasgow

## 2019-07-09 NOTE — Telephone Encounter (Signed)
-----   Message from Burtis Junes, NP sent at 07/09/2019 11:53 AM EDT ----- If you get a chance, can you call and check on Molly Tucker - remind her it will be ok to cut her metoprolol in half if her heart rate starts staying low - say less than 60  lori

## 2019-07-09 NOTE — Transfer of Care (Signed)
Immediate Anesthesia Transfer of Care Note  Patient: Molly Tucker  Procedure(s) Performed: CARDIOVERSION (N/A )  Patient Location: PACU and Endoscopy Unit  Anesthesia Type:General  Level of Consciousness: awake, oriented and patient cooperative  Airway & Oxygen Therapy: Patient Spontanous Breathing and Patient connected to nasal cannula oxygen  Post-op Assessment: Report given to RN and Post -op Vital signs reviewed and stable  Post vital signs: Reviewed and stable  Last Vitals:  Vitals Value Taken Time  BP    Temp    Pulse    Resp    SpO2      Last Pain:  Vitals:   07/09/19 0914  TempSrc: Oral  PainSc: 0-No pain         Complications: No apparent anesthesia complications

## 2019-07-09 NOTE — Op Note (Signed)
Procedure: Electrical Cardioversion Indications:  Atrial Fibrillation  Procedure Details:  Consent: Risks of procedure as well as the alternatives and risks of each were explained to the (patient/caregiver).  Consent for procedure obtained.  Time Out: Verified patient identification, verified procedure, site/side was marked, verified correct patient position, special equipment/implants available, medications/allergies/relevent history reviewed, required imaging and test results available.  Performed  Patient placed on cardiac monitor, pulse oximetry, supplemental oxygen as necessary.  Sedation given: Propofol 60 mg IV, Dr. Valma Cava Pacer pads placed anterior and posterior chest.  Cardioverted 1 time(s).  Cardioversion with synchronized biphasic 120J shock.  Evaluation: Findings: Post procedure EKG shows: NSR Complications: None Patient did tolerate procedure well.  Time Spent Directly with the Patient:  45 minutes   Althea Backs 07/09/2019, 9:55 AM

## 2019-07-09 NOTE — Discharge Instructions (Signed)

## 2019-07-09 NOTE — Telephone Encounter (Signed)
Noted.  Have her monitor her heart rate if she can.   Molly Tucker

## 2019-07-09 NOTE — Interval H&P Note (Signed)
History and Physical Interval Note:  07/09/2019 9:22 AM  Molly Tucker  has presented today for surgery, with the diagnosis of AFIB.  The various methods of treatment have been discussed with the patient and family. After consideration of risks, benefits and other options for treatment, the patient has consented to  Procedure(s): CARDIOVERSION (N/A) as a surgical intervention.  The patient's history has been reviewed, patient examined, no change in status, stable for surgery.  I have reviewed the patient's chart and labs.  Questions were answered to the patient's satisfaction.     Jhordan Mckibben

## 2019-07-10 ENCOUNTER — Encounter (HOSPITAL_COMMUNITY): Payer: Self-pay | Admitting: Cardiovascular Disease

## 2019-07-10 NOTE — Telephone Encounter (Signed)
lvm for pt to monitor HR everyday and keep a list.  Will call pt on Monday, 9/14 to check on pt

## 2019-07-14 ENCOUNTER — Encounter: Payer: Self-pay | Admitting: Nurse Practitioner

## 2019-07-14 ENCOUNTER — Telehealth: Payer: Self-pay | Admitting: *Deleted

## 2019-07-14 ENCOUNTER — Ambulatory Visit (INDEPENDENT_AMBULATORY_CARE_PROVIDER_SITE_OTHER): Payer: Medicare Other | Admitting: Nurse Practitioner

## 2019-07-14 ENCOUNTER — Other Ambulatory Visit: Payer: Self-pay

## 2019-07-14 VITALS — BP 128/68 | HR 62 | Ht 64.0 in | Wt 153.2 lb

## 2019-07-14 DIAGNOSIS — Z9889 Other specified postprocedural states: Secondary | ICD-10-CM | POA: Diagnosis not present

## 2019-07-14 DIAGNOSIS — I480 Paroxysmal atrial fibrillation: Secondary | ICD-10-CM | POA: Diagnosis not present

## 2019-07-14 DIAGNOSIS — R609 Edema, unspecified: Secondary | ICD-10-CM

## 2019-07-14 DIAGNOSIS — Z7189 Other specified counseling: Secondary | ICD-10-CM

## 2019-07-14 DIAGNOSIS — Z7901 Long term (current) use of anticoagulants: Secondary | ICD-10-CM | POA: Diagnosis not present

## 2019-07-14 MED ORDER — METOPROLOL SUCCINATE ER 50 MG PO TB24: 50.0000 mg | ORAL_TABLET | Freq: Every day | ORAL | 3 refills | Status: DC

## 2019-07-14 MED ORDER — FUROSEMIDE 40 MG PO TABS: 40.0000 mg | ORAL_TABLET | Freq: Every day | ORAL | 3 refills | Status: DC

## 2019-07-14 NOTE — Progress Notes (Signed)
CARDIOLOGY OFFICE NOTE  Date:  07/14/2019    Molly Tucker Date of Birth: 12-23-1931 Medical Record [id number]  PCP:  Binnie Rail, MD  Cardiologist:  Ree Shay    Chief Complaint  Patient presents with  . Follow-up    Post cardioversion    History of Present Illness: Molly Tucker is a 85 y.o. female who presents today for a work in visit.  Seen for [name]. Primarily follows with me.   She has a history of PAF, HTN, HLD, and osteoporosis. She has had prior cardioversions back to NSR - with improvement in symptoms. Cardioverted in June of 2016, February of 2017 and in March of 2018.   Last seen in the latter part of August - she was back in AF - she did not wish to have cardioversion - I talked with [name] - we tried a one time dose of Flecainide - this did not work - ended up getting her cardioverted last week back to NSR.   She called earlier this morning - not feeling well - thinking she was in AF - thus added to today.   The patient does not have symptoms concerning for COVID-19 infection (fever, chills, cough, or new shortness of breath).   Comes in today. Here alone. She does not feel good. Her weight is up - up 10 pounds. She is quite fatigued. She is short of breath with activity. She has worsening edema. She noted her heart rate down in the 50's - she did cut her dose of Toprol back for a few days last week - then thought she might need to be on it - so she increased her dose back up. No chest pain. She had a fair amount of queasiness post cardioversion. She feels bloated.   Past Medical History:  Diagnosis Date  . Atrial tachycardia ([place])   . Carotid bruit 2005   right, carotid doppler: <39% occlusion  . Hyperlipidemia    LDL goal =<120  . Hyperplastic colonic polyp 2006   Dr. Carlean Purl, due 2016  . Hypertension   . Osteoporosis    S/P  biphosphonates x 5 yrs  . Persistent atrial fibrillation     Past Surgical History:  Procedure  Laterality Date  . CARDIOVERSION N/A 03/31/2015   Procedure: CARDIOVERSION;  Surgeon: Sanda Klein, MD;  Location: [facility] ENDOSCOPY;  Service: Cardiovascular;  Laterality: N/A;  . CARDIOVERSION N/A 12/21/2015   Procedure: CARDIOVERSION;  Surgeon: Lelon Perla, MD;  Location: [facility] ENDOSCOPY;  Service: Cardiovascular;  Laterality: N/A;  . CARDIOVERSION N/A 01/09/2017   Procedure: CARDIOVERSION;  Surgeon: Jerline Pain, MD;  Location: [place];  Service: Cardiovascular;  Laterality: N/A;  . CARDIOVERSION N/A 07/09/2019   Procedure: CARDIOVERSION;  Surgeon: Sanda Klein, MD;  Location: [facility];  Service: Cardiovascular;  Laterality: N/A;  . COLONOSCOPY W/ POLYPECTOMY  2006  . DILATION AND CURETTAGE OF UTERUS    . MOUTH SURGERY  2009-2010   implants, Dr. Marcelyn Ditty     Medications: Current Meds  Medication Sig  . acetaminophen (TYLENOL) 500 MG tablet Take 500 mg by mouth 2 (two) times daily as needed (arthritis pain).  Marland Kitchen ELIQUIS 5 MG TABS tablet TAKE 1 TABLET BY MOUTH  TWICE DAILY  . metoprolol succinate (TOPROL-XL) 50 MG 24 hr tablet Take 1 tablet (50 mg total) by mouth daily. TAKE 1 TABLET BY MOUTH  DAILY WITH OR IMMEDIATELY  FOLLOWING A MEAL.  . [DISCONTINUED] metoprolol  succinate (TOPROL-XL) 50 MG 24 hr tablet Take 2 tablets (100 mg total) by mouth daily. TAKE 1 TABLET BY MOUTH  DAILY WITH OR IMMEDIATELY  FOLLOWING A MEAL.     Allergies: No Known Allergies  Social History: The patient  reports that she quit smoking about 42 years ago. She has never used smokeless tobacco. She reports that she does not drink alcohol or use drugs.   Family History: The patient's family history includes Arthritis in an other family member; Cancer in her paternal aunt and sister; Coronary artery disease in her father and sister.   Review of Systems: Please see the history of present illness.   All other systems are reviewed and negative.   Physical Exam: VS:  BP 128/68   Pulse 62   Ht 5\' 4"   (1.626 m)   Wt 153 lb 3.2 oz (69.5 kg)   SpO2 94%   BMI 26.30 kg/m  .  BMI Body mass index is 26.3 kg/m.  Wt Readings from Last 3 Encounters:  07/14/19 153 lb 3.2 oz (69.5 kg)  07/09/19 143 lb (64.9 kg)  06/24/19 143 lb (64.9 kg)    General: Pleasant. She does not look like she feels as well today. She is in no acute distress. Her weight is up 10 pounds.    HEENT: Normal.  Neck: Supple, no JVD, carotid bruits, or masses noted.  Cardiac: Regular rate and rhythm. No murmurs, rubs, or gallops. 2+ edema.  Respiratory:  Lungs are clear to auscultation bilaterally with normal work of breathing.  GI: Soft and nontender.  MS: No deformity or atrophy. Gait and ROM intact.  Skin: Warm and dry. Color is normal.  Neuro:  Strength and sensation are intact and no gross focal deficits noted.  Psych: Alert, appropriate and with normal affect.   LABORATORY DATA:  EKG:  EKG is ordered today. This demonstrates NSR with 1st degree AV block today - HR is 62.  Lab Results  Component Value Date   WBC 6.2 07/04/2019   HGB 14.0 07/04/2019   HCT 43.5 07/04/2019   PLT 210 07/04/2019   GLUCOSE 90 07/04/2019   CHOL 189 06/24/2019   TRIG 88 06/24/2019   HDL 54 06/24/2019   LDLCALC 117 (H) 06/24/2019   ALT 17 06/24/2019   AST 18 06/24/2019   NA 142 07/04/2019   K 4.8 07/04/2019   CL 104 07/04/2019   CREATININE 0.95 07/04/2019   BUN 16 07/04/2019   CO2 23 07/04/2019   TSH 2.840 06/24/2019   INR 1.1 07/04/2019   HGBA1C 5.8 12/22/2009     BNP (last 3 results) No results for input(s): BNP in the last 8760 hours.  ProBNP (last 3 results) No results for input(s): PROBNP in the last 8760 hours.   Other Studies Reviewed Today:  Echo Study Conclusions 02/2015  - Left ventricle: The cavity size was normal. Wall thickness was normal. Systolic function was normal. The estimated ejection fraction was in the range of 55% to 60%.Wall motion was normal; there were no regional wall motion  abnormalities. - Right ventricle: The cavity size was mildly dilated. - Right atrium: The atrium was mildly dilated. - Atrial septum: No defect or patent foramen ovale was identified. - Tricuspid valve: There was moderate regurgitation.   Assessment/Plan:  1. PAF - has now had cardioversion x 4 - most recently last week. She does remain in sinus - I worry that even though we have restored the electrical part, that the mechanical part  is still not in sync for her and she has lost some of her atrial kick leading to worsening HF. We will start lasix today. I am cutting the Toprol back to her usual dose prior to cardioversion.   2. Chronic anticoagulation - with Eliquis - no missed doses. No bleeding noted.   3. Swelling - most likely diastolic HF - starting Lasix 40 mg a day for 3 days - then cut back to 20 mg - she may only need for a few days - she is given the ok to stop this as she improves. We will check a BMET today. If she fails to improve - we will need to proceed with updating her echo.   4.  COVID-19 Education: The signs and symptoms of COVID-19 were discussed with the patient and how to seek care for testing (follow up with PCP or arrange E-visit).  The importance of social distancing, staying at home, hand hygiene and wearing a mask when out in public were discussed today.  Current medicines are reviewed with the patient today.  The patient does not have concerns regarding medicines other than what has been noted above.  The following changes have been made:  See above.  Labs/ tests ordered today include:    Orders Placed This Encounter  Procedures  . Basic metabolic panel  . EKG 12-Lead     Disposition:   FU with me as planned later this month. BMET today - may need to consider potassium supplementation. Otherwise, plan as above.    Patient is agreeable to this plan and will call if any problems develop in the interim.   SignedTruitt Merle, NP  07/14/2019 3:01  PM  [facility] [address] [place], [place]  [address] Phone: [phone] Fax: [phone]

## 2019-07-14 NOTE — Patient Instructions (Addendum)
After Visit Summary:  We will be checking the following labs today - BMET   Medication Instructions:    Continue with your current medicines. BUT  Cut the Toprol back to 50 mg a day  Start Lasix 40 mg - one tablet for 3 days - then cut back to 20 mg (1/2 tab) daily - ok to stop when you feel the swelling is gone.    If you need a refill on your cardiac medications before your next appointment, please call your pharmacy.     Testing/Procedures To Be Arranged:  N/A  Follow-Up:   See me as planned later this month    At [facility], you and your health needs are our priority.  As part of our continuing mission to provide you with exceptional heart care, we have created designated Provider Care Teams.  These Care Teams include your primary Cardiologist (physician) and Advanced Practice Providers (APPs -  Physician Assistants and Nurse Practitioners) who all work together to provide you with the care you need, when you need it.  Special Instructions:  . Stay safe, stay home, wash your hands for at least 20 seconds and wear a mask when out in public.  . It was good to talk with you today.    Call the [place] office at [phone] if you have any questions, problems or concerns.

## 2019-07-14 NOTE — Telephone Encounter (Signed)

## 2019-07-14 NOTE — Telephone Encounter (Signed)
S/w pt is coming in today, Monday, Sept 14  for appt at 2:30 pm.

## 2019-07-15 ENCOUNTER — Other Ambulatory Visit: Payer: Self-pay | Admitting: *Deleted

## 2019-07-15 LAB — BASIC METABOLIC PANEL
BUN/Creatinine Ratio: 18 (ref 12–28)
BUN: 15 mg/dL (ref 8–27)
CO2: 25 mmol/L (ref 20–29)
Calcium: 9.4 mg/dL (ref 8.7–10.3)
Chloride: 106 mmol/L (ref 96–106)
Creatinine, Ser: 0.82 mg/dL (ref 0.57–1.00)
GFR calc Af Amer: 75 mL/min/{1.73_m2} (ref >59)
GFR calc non Af Amer: 65 mL/min/{1.73_m2} (ref >59)
Glucose: 112 mg/dL — ABNORMAL HIGH (ref 65–99)
Potassium: 4.3 mmol/L (ref 3.5–5.2)
Sodium: 142 mmol/L (ref 134–144)

## 2019-07-15 MED ORDER — POTASSIUM CHLORIDE ER 10 MEQ PO TBCR: 10.0000 meq | EXTENDED_RELEASE_TABLET | Freq: Every day | ORAL | 1 refills | Status: DC

## 2019-07-25 NOTE — Progress Notes (Signed)
CARDIOLOGY OFFICE NOTE  Date:  07/28/2019    [name] Date of Birth: 10-19-1932 Medical Record [id number]  PCP:  Binnie Rail, MD  Cardiologist:  Ree Shay   Chief Complaint  Patient presents with  . Follow-up    History of Present Illness: LORRY WANT is a 85 y.o. female who presents today for a follow up visit. Seen for [name].Primarily follows with me.  She has a history of PAF, HTN, HLD, and osteoporosis. She has had prior cardioversions back to NSR - with improvement in symptoms. Cardioverted in June of 2016, February of 2017 and in March of 2018.   Last seen in the latter part of August - she was back in AF - she did not wish to have cardioversion - I talked with [name] - we tried a one time dose of Flecainide - this did not work - ended up getting her cardioverted back to NSR. I then saw her a work in last week - lots of volume overload - she was holding in NSR - I put her on a short course of Lasix - on phone call follow up - she had improved.   The patient does not have symptoms concerning for COVID-19 infection (fever, chills, cough, or new shortness of breath).   Comes in today. Here alone. She is doing well - weight is back down. Swelling is gone. She notes an occasional skip/palpitation. Fatigue is improving. No chest pain. She is asking about vaccines. Overall, clearly improved. BP is fine at home. She is happy that she is "feeling good again".   Past Medical History:  Diagnosis Date  . Atrial tachycardia ([place])   . Carotid bruit 2005   right, carotid doppler: <39% occlusion  . Hyperlipidemia    LDL goal =<120  . Hyperplastic colonic polyp 2006   Dr. Carlean Purl, due 2016  . Hypertension   . Osteoporosis    S/P  biphosphonates x 5 yrs  . Persistent atrial fibrillation     Past Surgical History:  Procedure Laterality Date  . CARDIOVERSION N/A 03/31/2015   Procedure: CARDIOVERSION;  Surgeon: Sanda Klein, MD;  Location: [facility]  ENDOSCOPY;  Service: Cardiovascular;  Laterality: N/A;  . CARDIOVERSION N/A 12/21/2015   Procedure: CARDIOVERSION;  Surgeon: Lelon Perla, MD;  Location: [facility] ENDOSCOPY;  Service: Cardiovascular;  Laterality: N/A;  . CARDIOVERSION N/A 01/09/2017   Procedure: CARDIOVERSION;  Surgeon: Jerline Pain, MD;  Location: [place];  Service: Cardiovascular;  Laterality: N/A;  . CARDIOVERSION N/A 07/09/2019   Procedure: CARDIOVERSION;  Surgeon: Sanda Klein, MD;  Location: [place];  Service: Cardiovascular;  Laterality: N/A;  . COLONOSCOPY W/ POLYPECTOMY  2006  . DILATION AND CURETTAGE OF UTERUS    . MOUTH SURGERY  2009-2010   implants, Dr. Marcelyn Ditty     Medications: Current Meds  Medication Sig  . acetaminophen (TYLENOL) 500 MG tablet Take 500 mg by mouth 2 (two) times daily as needed (arthritis pain).  Marland Kitchen ELIQUIS 5 MG TABS tablet TAKE 1 TABLET BY MOUTH  TWICE DAILY  . metoprolol succinate (TOPROL-XL) 50 MG 24 hr tablet Take 1 tablet (50 mg total) by mouth daily. TAKE 1 TABLET BY MOUTH  DAILY WITH OR IMMEDIATELY  FOLLOWING A MEAL.     Allergies: No Known Allergies  Social History: The patient  reports that she quit smoking about 42 years ago. She has never used smokeless tobacco. She reports that she does not drink alcohol  or use drugs.   Family History: The patient's family history includes Arthritis in an other family member; Cancer in her paternal aunt and sister; Coronary artery disease in her father and sister.   Review of Systems: Please see the history of present illness.   All other systems are reviewed and negative.   Physical Exam: VS:  BP (!) 160/82   Pulse 60   Ht 5\' 4"  (1.626 m)   Wt 143 lb (64.9 kg)   SpO2 98%   BMI 24.55 kg/m  .  BMI Body mass index is 24.55 kg/m.  Wt Readings from Last 3 Encounters:  07/28/19 143 lb (64.9 kg)  07/14/19 153 lb 3.2 oz (69.5 kg)  07/09/19 143 lb (64.9 kg)    General: Pleasant. She looks younger than her stated age.  Her  weight is back down.   HEENT: Normal.  Neck: Supple, no JVD, carotid bruits, or masses noted.  Cardiac: Regular rate and rhythm. No murmurs, rubs, or gallops. No edema today.  Respiratory:  Lungs are clear to auscultation bilaterally with normal work of breathing.  GI: Soft and nontender.  MS: No deformity or atrophy. Gait and ROM intact.  Skin: Warm and dry. Color is normal.  Neuro:  Strength and sensation are intact and no gross focal deficits noted.  Psych: Alert, appropriate and with normal affect.   LABORATORY DATA:  EKG:  EKG is not ordered today.  Lab Results  Component Value Date   WBC 6.2 07/04/2019   HGB 14.0 07/04/2019   HCT 43.5 07/04/2019   PLT 210 07/04/2019   GLUCOSE 112 (H) 07/14/2019   CHOL 189 06/24/2019   TRIG 88 06/24/2019   HDL 54 06/24/2019   LDLCALC 117 (H) 06/24/2019   ALT 17 06/24/2019   AST 18 06/24/2019   NA 142 07/14/2019   K 4.3 07/14/2019   CL 106 07/14/2019   CREATININE 0.82 07/14/2019   BUN 15 07/14/2019   CO2 25 07/14/2019   TSH 2.840 06/24/2019   INR 1.1 07/04/2019   HGBA1C 5.8 12/22/2009     BNP (last 3 results) No results for input(s): BNP in the last 8760 hours.  ProBNP (last 3 results) No results for input(s): PROBNP in the last 8760 hours.   Other Studies Reviewed Today: Procedure: Electrical Cardioversion Indications:  Atrial Fibrillation  Procedure Details:  Consent: Risks of procedure as well as the alternatives and risks of each were explained to the (patient/caregiver).  Consent for procedure obtained.  Time Out: Verified patient identification, verified procedure, site/side was marked, verified correct patient position, special equipment/implants available, medications/allergies/relevent history reviewed, required imaging and test results available.  Performed  Patient placed on cardiac monitor, pulse oximetry, supplemental oxygen as necessary.  Sedation given: Propofol 60 mg IV, Dr. Valma Cava Pacer pads placed  anterior and posterior chest.  Cardioverted 1 time(s).  Cardioversion with synchronized biphasic 120J shock.  Evaluation: Findings: Post procedure EKG shows: NSR Complications: None Patient did tolerate procedure well.  Time Spent Directly with the Patient:  45 minutes   Mihai Croitoru 07/09/2019, 9:55 AM  Echo Study Conclusions 02/2015  - Left ventricle: The cavity size was normal. Wall thickness was normal. Systolic function was normal. The estimated ejection fraction was in the range of 55% to 60%.Wall motion was normal; there were no regional wall motion abnormalities. - Right ventricle: The cavity size was mildly dilated. - Right atrium: The atrium was mildly dilated. - Atrial septum: No defect or patent foramen ovale was identified. -  Tricuspid valve: There was moderate regurgitation.   Assessment/Plan:  1. PAF - has now had cardioversion x 4 - most recently earlier this month. She has had associated volume overload and is symptomatic with her AF. Doing much better now. Still may need to consider AAD therapy if has recurrence.    2. Chronic anticoagulation - with Eliquis - no problems noted.   3. Chronic diastolic HF - improved with low dose diuretic - no longer on - her weight is back down. Would consider updating her echo if this recurs.   4. COVID-19 Education: The signs and symptoms of COVID-19 were discussed with the patient and how to seek care for testing (follow up with PCP or arrange E-visit).  The importance of social distancing, staying at home, hand hygiene and wearing a mask when out in public were discussed today.  Current medicines are reviewed with the patient today.  The patient does not have concerns regarding medicines other than what has been noted above.  The following changes have been made:  See above.  Labs/ tests ordered today include:   No orders of the defined types were placed in this encounter.    Disposition:   FU  with me as planned in December.   Patient is agreeable to this plan and will call if any problems develop in the interim.   SignedTruitt Merle, NP  07/28/2019 2:35 PM  [place] [address] [place], [place]  [address] Phone: [phone] Fax: [phone]

## 2019-07-28 ENCOUNTER — Encounter: Payer: Self-pay | Admitting: Nurse Practitioner

## 2019-07-28 ENCOUNTER — Other Ambulatory Visit: Payer: Self-pay

## 2019-07-28 ENCOUNTER — Ambulatory Visit (INDEPENDENT_AMBULATORY_CARE_PROVIDER_SITE_OTHER): Payer: Medicare Other | Admitting: Nurse Practitioner

## 2019-07-28 VITALS — BP 160/82 | HR 60 | Ht 64.0 in | Wt 143.0 lb

## 2019-07-28 DIAGNOSIS — Z9889 Other specified postprocedural states: Secondary | ICD-10-CM | POA: Diagnosis not present

## 2019-07-28 DIAGNOSIS — I480 Paroxysmal atrial fibrillation: Secondary | ICD-10-CM

## 2019-07-28 DIAGNOSIS — Z7189 Other specified counseling: Secondary | ICD-10-CM | POA: Diagnosis not present

## 2019-07-28 DIAGNOSIS — R609 Edema, unspecified: Secondary | ICD-10-CM | POA: Diagnosis not present

## 2019-07-28 NOTE — Patient Instructions (Addendum)
After Visit Summary:  We will be checking the following labs today - NONE   Medication Instructions:    Continue with your current medicines.    If you need a refill on your cardiac medications before your next appointment, please call your pharmacy.     Testing/Procedures To Be Arranged:  N/A  Follow-Up:   See me in December as planned    At [facility], you and your health needs are our priority.  As part of our continuing mission to provide you with exceptional heart care, we have created designated Provider Care Teams.  These Care Teams include your primary Cardiologist (physician) and Advanced Practice Providers (APPs -  Physician Assistants and Nurse Practitioners) who all work together to provide you with the care you need, when you need it.  Special Instructions:  . Stay safe, stay home, wash your hands for at least 20 seconds and wear a mask when out in public.  . It was good to talk with you today.    Call the [place] office at [phone] if you have any questions, problems or concerns.

## 2019-07-29 ENCOUNTER — Encounter: Payer: Self-pay | Admitting: Internal Medicine

## 2019-08-04 ENCOUNTER — Ambulatory Visit (INDEPENDENT_AMBULATORY_CARE_PROVIDER_SITE_OTHER): Payer: Medicare Other

## 2019-08-04 ENCOUNTER — Other Ambulatory Visit: Payer: Self-pay

## 2019-08-04 DIAGNOSIS — Z23 Encounter for immunization: Secondary | ICD-10-CM

## 2019-08-04 DIAGNOSIS — Z299 Encounter for prophylactic measures, unspecified: Secondary | ICD-10-CM

## 2019-09-08 NOTE — Patient Instructions (Addendum)
All other Health Maintenance issues reviewed.   All recommended immunizations and age-appropriate screenings are up-to-date or discussed.  No immunization administered today.   Medications reviewed and updated.  Changes include :   none    Please followup in 1 year   Health Maintenance, Female Adopting a healthy lifestyle and getting preventive care are important in promoting health and wellness. Ask your health care provider about:  The right schedule for you to have regular tests and exams.  Things you can do on your own to prevent diseases and keep yourself healthy. What should I know about diet, weight, and exercise? Eat a healthy diet   Eat a diet that includes plenty of vegetables, fruits, low-fat dairy products, and lean protein.  Do not eat a lot of foods that are high in solid fats, added sugars, or sodium. Maintain a healthy weight Body mass index (BMI) is used to identify weight problems. It estimates body fat based on height and weight. Your health care provider can help determine your BMI and help you achieve or maintain a healthy weight. Get regular exercise Get regular exercise. This is one of the most important things you can do for your health. Most adults should:  Exercise for at least 150 minutes each week. The exercise should increase your heart rate and make you sweat (moderate-intensity exercise).  Do strengthening exercises at least twice a week. This is in addition to the moderate-intensity exercise.  Spend less time sitting. Even light physical activity can be beneficial. Watch cholesterol and blood lipids Have your blood tested for lipids and cholesterol at 83 years of age, then have this test every 5 years. Have your cholesterol levels checked more often if:  Your lipid or cholesterol levels are high.  You are older than 83 years of age.  You are at high risk for heart disease. What should I know about cancer screening? Depending on your health  history and family history, you may need to have cancer screening at various ages. This may include screening for:  Breast cancer.  Cervical cancer.  Colorectal cancer.  Skin cancer.  Lung cancer. What should I know about heart disease, diabetes, and high blood pressure? Blood pressure and heart disease  High blood pressure causes heart disease and increases the risk of stroke. This is more likely to develop in people who have high blood pressure readings, are of African descent, or are overweight.  Have your blood pressure checked: ? Every 3-5 years if you are 18-39 years of age. ? Every year if you are 40 years old or older. Diabetes Have regular diabetes screenings. This checks your fasting blood sugar level. Have the screening done:  Once every three years after age 40 if you are at a normal weight and have a low risk for diabetes.  More often and at a younger age if you are overweight or have a high risk for diabetes. What should I know about preventing infection? Hepatitis B If you have a higher risk for hepatitis B, you should be screened for this virus. Talk with your health care provider to find out if you are at risk for hepatitis B infection. Hepatitis C Testing is recommended for:  Everyone born from 1945 through 1965.  Anyone with known risk factors for hepatitis C. Sexually transmitted infections (STIs)  Get screened for STIs, including gonorrhea and chlamydia, if: ? You are sexually active and are younger than 83 years of age. ? You are older than 83 years   of age and your health care provider tells you that you are at risk for this type of infection. ? Your sexual activity has changed since you were last screened, and you are at increased risk for chlamydia or gonorrhea. Ask your health care provider if you are at risk.  Ask your health care provider about whether you are at high risk for HIV. Your health care provider may recommend a prescription medicine to  help prevent HIV infection. If you choose to take medicine to prevent HIV, you should first get tested for HIV. You should then be tested every 3 months for as long as you are taking the medicine. Pregnancy  If you are about to stop having your period (premenopausal) and you may become pregnant, seek counseling before you get pregnant.  Take 400 to 800 micrograms (mcg) of folic acid every day if you become pregnant.  Ask for birth control (contraception) if you want to prevent pregnancy. Osteoporosis and menopause Osteoporosis is a disease in which the bones lose minerals and strength with aging. This can result in bone fractures. If you are 65 years old or older, or if you are at risk for osteoporosis and fractures, ask your health care provider if you should:  Be screened for bone loss.  Take a calcium or vitamin D supplement to lower your risk of fractures.  Be given hormone replacement therapy (HRT) to treat symptoms of menopause. Follow these instructions at home: Lifestyle  Do not use any products that contain nicotine or tobacco, such as cigarettes, e-cigarettes, and chewing tobacco. If you need help quitting, ask your health care provider.  Do not use street drugs.  Do not share needles.  Ask your health care provider for help if you need support or information about quitting drugs. Alcohol use  Do not drink alcohol if: ? Your health care provider tells you not to drink. ? You are pregnant, may be pregnant, or are planning to become pregnant.  If you drink alcohol: ? Limit how much you use to 0-1 drink a day. ? Limit intake if you are breastfeeding.  Be aware of how much alcohol is in your drink. In the [place] one drink equals one 12 oz bottle of beer (355 mL), one 5 oz glass of wine (148 mL), or one 1 oz glass of hard liquor (44 mL). General instructions  Schedule regular health, dental, and eye exams.  Stay current with your vaccines.  Tell your health care  provider if: ? You often feel depressed. ? You have ever been abused or do not feel safe at home. Summary  Adopting a healthy lifestyle and getting preventive care are important in promoting health and wellness.  Follow your health care provider's instructions about healthy diet, exercising, and getting tested or screened for diseases.  Follow your health care provider's instructions on monitoring your cholesterol and blood pressure. This information is not intended to replace advice given to you by your health care provider. Make sure you discuss any questions you have with your health care provider. Document Released: 05/01/2011 Document Revised: 10/09/2018 Document Reviewed: 10/09/2018 Elsevier Patient Education  2020 Elsevier Inc.  

## 2019-09-08 NOTE — Assessment & Plan Note (Addendum)
DEXA due-deferred - does not want further testing Was on Fosamax in the past She is active and exercising some

## 2019-09-08 NOTE — Progress Notes (Signed)
Subjective:    Patient ID: Molly Tucker, female    DOB: 1932-06-13, 85 y.o.   MRN: [id number]  HPI She is here for a physical exam.   She just had a cardioversion in Sept.  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  She feels she is handling this well.   She otherwise feels well.    Medications and allergies reviewed with patient and updated if appropriate.  Patient Active Problem List   Diagnosis Date Noted  . Prolapse of female bladder, acquired 09/09/2019  . Leg cramps 04/06/2017  . Osteoarthritis, hand 02/01/2016  . Persistent atrial fibrillation ([place])   . COLONIC POLYPS, HX OF 12/15/2008  . HYPERLIPIDEMIA 11/21/2007  . HTN (hypertension) 11/21/2007  . Osteoporosis 09/13/2007    Current Outpatient Medications on File Prior to Visit  Medication Sig Dispense Refill  . acetaminophen (TYLENOL) 500 MG tablet Take 500 mg by mouth 2 (two) times daily as needed (arthritis pain).    Marland Kitchen ELIQUIS 5 MG TABS tablet TAKE 1 TABLET BY MOUTH  TWICE DAILY 180 tablet 0  . metoprolol succinate (TOPROL-XL) 50 MG 24 hr tablet Take 1 tablet (50 mg total) by mouth daily. TAKE 1 TABLET BY MOUTH  DAILY WITH OR IMMEDIATELY  FOLLOWING A MEAL. 180 tablet 3   No current facility-administered medications on file prior to visit.     Past Medical History:  Diagnosis Date  . Atrial tachycardia ([place])   . Carotid bruit 2005   right, carotid doppler: <39% occlusion  . Hyperlipidemia    LDL goal =<120  . Hyperplastic colonic polyp 2006   Dr. Carlean Purl, due 2016  . Hypertension   . Osteoporosis    S/P  biphosphonates x 5 yrs  . Persistent atrial fibrillation [facility])     Past Surgical History:  Procedure Laterality Date  . CARDIOVERSION N/A 03/31/2015   Procedure: CARDIOVERSION;  Surgeon: Sanda Klein, MD;  Location: [facility] ENDOSCOPY;  Service: Cardiovascular;  Laterality: N/A;  . CARDIOVERSION N/A 12/21/2015   Procedure: CARDIOVERSION;  Surgeon: Lelon Perla, MD;  Location:  [facility] ENDOSCOPY;  Service: Cardiovascular;  Laterality: N/A;  . CARDIOVERSION N/A 01/09/2017   Procedure: CARDIOVERSION;  Surgeon: Jerline Pain, MD;  Location: [place];  Service: Cardiovascular;  Laterality: N/A;  . CARDIOVERSION N/A 07/09/2019   Procedure: CARDIOVERSION;  Surgeon: Sanda Klein, MD;  Location: [place];  Service: Cardiovascular;  Laterality: N/A;  . COLONOSCOPY W/ POLYPECTOMY  2006  . DILATION AND CURETTAGE OF UTERUS    . MOUTH SURGERY  2009-2010   implants, Dr. Marcelyn Ditty    Social History   Socioeconomic History  . Marital status: Married    Spouse name: Not on file  . Number of children: Not on file  . Years of education: Not on file  . Highest education level: Not on file  Occupational History  . Occupation: Retired    Fish farm manager: [employer]  . Financial resource strain: Not on file  . Food insecurity    Worry: Not on file    Inability: Not on file  . Transportation needs    Medical: Not on file    Non-medical: Not on file  Tobacco Use  . Smoking status: Former Smoker    Quit date: 10/30/1976    Years since quitting: 42.8  . Smokeless tobacco: Never Used  . Tobacco comment: Patient would ONLY smoke occasionally   Substance and Sexual Activity  . Alcohol  use: No  . Drug use: No  . Sexual activity: Not on file  Lifestyle  . Physical activity    Days per week: Not on file    Minutes per session: Not on file  . Stress: Not on file  Relationships  . Social Herbalist on phone: Not on file    Gets together: Not on file    Attends religious service: Not on file    Active member of club or organization: Not on file    Attends meetings of clubs or organizations: Not on file    Relationship status: Not on file  Other Topics Concern  . Not on file  Social History Narrative   Regular exercise: yes: walking 30 minute once daily    Family History  Problem Relation Age of Onset  . Coronary artery disease Father        MI in  49s  . Cancer Sister        breast  . Coronary artery disease Sister        stent 2009  . Cancer Paternal Aunt        colon, possible breast cancer  . Arthritis Other        aunts  . Stroke Neg Hx     Review of Systems  Constitutional: Negative for chills and fever.  Eyes: Negative for visual disturbance.  Respiratory: Negative for cough, shortness of breath and wheezing.   Cardiovascular: Positive for palpitations (rare). Negative for chest pain and leg swelling.  Gastrointestinal: Negative for abdominal pain, blood in stool, constipation, diarrhea and nausea.       No gerd  Genitourinary: Positive for frequency (from prolapse bladder). Negative for dysuria and hematuria.  Musculoskeletal: Positive for arthralgias and back pain.  Skin: Negative for color change and rash.  Neurological: Negative for dizziness, light-headedness and headaches.  Psychiatric/Behavioral: Negative for dysphoric mood. The patient is not nervous/anxious.        Objective:   Vitals:   09/09/19 1413  BP: (!) 146/82  Pulse: 75  Resp: 16  Temp: 98.9 F (37.2 C)  SpO2: 98%   Filed Weights   09/09/19 1413  Weight: 139 lb 12.8 oz (63.4 kg)   Body mass index is 24 kg/m.  BP Readings from Last 3 Encounters:  09/09/19 (!) 146/82  07/28/19 (!) 160/82  07/14/19 128/68    Wt Readings from Last 3 Encounters:  09/09/19 139 lb 12.8 oz (63.4 kg)  07/28/19 143 lb (64.9 kg)  07/14/19 153 lb 3.2 oz (69.5 kg)     Physical Exam Constitutional: She appears well-developed and well-nourished. No distress.  HENT:  Head: Normocephalic and atraumatic.  Right Ear: External ear normal. Normal ear canal and TM Left Ear: External ear normal.  Normal ear canal and TM Mouth/Throat: Oropharynx is clear and moist.  Eyes: Conjunctivae and EOM are normal.  Neck: Neck supple. No tracheal deviation present. No thyromegaly present.  No carotid bruit  Cardiovascular: Normal rate, regular rhythm and normal heart  sounds.  No murmur heard.  No edema. Pulmonary/Chest: Effort normal and breath sounds normal. No respiratory distress. She has no wheezes. She has no rales.  Breast: deferred   Abdominal: Soft. She exhibits no distension. There is no tenderness.  Lymphadenopathy: She has no cervical adenopathy.  Skin: Skin is warm and dry. She is not diaphoretic.  Psychiatric: She has a normal mood and affect. Her behavior is normal.        Assessment &  Plan:   Physical exam: Screening blood work    ordered Immunizations deferredShingrix, other vaccines up-to-date Colonoscopy   aged out Mammogram  aged out Gyn-no longer seen Dexa      due-  She had it in the past - deferred Eye exams     Due now - will schedule Exercise very active, yard work, housework, walking the dog Weight  normal BMI Substance abuse    none  See Problem List for Assessment and Plan of chronic medical problems.   Follow-up annually

## 2019-09-09 ENCOUNTER — Ambulatory Visit (INDEPENDENT_AMBULATORY_CARE_PROVIDER_SITE_OTHER): Payer: Medicare Other | Admitting: Internal Medicine

## 2019-09-09 ENCOUNTER — Encounter: Payer: Self-pay | Admitting: Internal Medicine

## 2019-09-09 ENCOUNTER — Other Ambulatory Visit: Payer: Self-pay

## 2019-09-09 VITALS — BP 146/82 | HR 75 | Temp 98.9°F | Resp 16 | Ht 64.0 in | Wt 139.8 lb

## 2019-09-09 DIAGNOSIS — N8110 Cystocele, unspecified: Secondary | ICD-10-CM | POA: Insufficient documentation

## 2019-09-09 DIAGNOSIS — I4819 Other persistent atrial fibrillation: Secondary | ICD-10-CM | POA: Diagnosis not present

## 2019-09-09 DIAGNOSIS — M810 Age-related osteoporosis without current pathological fracture: Secondary | ICD-10-CM | POA: Diagnosis not present

## 2019-09-09 DIAGNOSIS — Z0000 Encounter for general adult medical examination without abnormal findings: Secondary | ICD-10-CM | POA: Diagnosis not present

## 2019-09-09 DIAGNOSIS — I10 Essential (primary) hypertension: Secondary | ICD-10-CM | POA: Diagnosis not present

## 2019-09-09 NOTE — Assessment & Plan Note (Signed)
Has frequent urination Tolerating frequency - can refer if she wants

## 2019-09-09 NOTE — Assessment & Plan Note (Addendum)
S/p cardioversion sept 4th On eliquis and metoprolol Asymptomatic

## 2019-09-09 NOTE — Assessment & Plan Note (Addendum)
BP overall controlled Current regimen effective and well tolerated Continue current medications at current doses

## 2019-09-21 ENCOUNTER — Other Ambulatory Visit: Payer: Self-pay | Admitting: Nurse Practitioner

## 2019-09-22 NOTE — Telephone Encounter (Signed)
Age 85, weight 63kg, SCr 0.82 on 07/14/19, last OV Sept 2020, afib indication

## 2019-10-11 NOTE — Progress Notes (Signed)
CARDIOLOGY OFFICE NOTE  Date:  10/13/2019    Molly Tucker Date of Birth: 05-16-1932 Medical Record [id number]  PCP:  Binnie Rail, MD  Cardiologist:  Ree Shay    Chief Complaint  Patient presents with  . Follow-up    History of Present Illness: Molly Tucker is a 85 y.o. female who presents today for a 3 month check. Seen for [name].Primarily follows with me.  She has a history of PAF, HTN, HLD, and osteoporosis. She has had prior cardioversions back to NSR - with improvement in symptoms. Cardioverted in June of 2016, February of 2017 and in March of 2018.   Last seenin the latter part of August - she was back in AF - she did not wish to have cardioversion - I talked with [name] - we tried a one time dose of Flecainide - this did not work - ended up getting her cardioverted back to NSR. I then saw her a work in last week - lots of volume overload - she was holding in NSR - I put her on a short course of Lasix - on phone call follow up - she had improved. Last seen at the end of September and she was back to her baseline.   The patient does not have symptoms concerning for COVID-19 infection (fever, chills, cough, or new shortness of breath).   Comes in today. Here with [name] - I am seeing him as well. [name] feeling good. No chest pain. Breathing is good. Rhythm is good. BP is good at home - lower than what it is here today. She feels like she is doing ok - more worried about him.   Past Medical History:  Diagnosis Date  . Atrial tachycardia ([place])   . Carotid bruit 2005   right, carotid doppler: <39% occlusion  . Hyperlipidemia    LDL goal =<120  . Hyperplastic colonic polyp 2006   Dr. Carlean Purl, due 2016  . Hypertension   . Osteoporosis    S/P  biphosphonates x 5 yrs  . Persistent atrial fibrillation [facility])     Past Surgical History:  Procedure Laterality Date  . CARDIOVERSION N/A 03/31/2015   Procedure: CARDIOVERSION;  Surgeon: Sanda Klein, MD;  Location: [facility] END[facility]COPY;  Service: Cardiovascular;  Laterality: N/A;  . CARDIOVERSION N/A 12/21/2015   Procedure: CARDIOVERSION;  Surgeon: Lelon Perla, MD;  Location: [facility] ENDOSCOPY;  Service: Cardiovascular;  Laterality: N/A;  . CARDIOVERSION N/A 01/09/2017   Procedure: CARDIOVERSION;  Surgeon: Jerline Pain, MD;  Location: [place];  Service: Cardiovascular;  Laterality: N/A;  . CARDIOVERSION N/A 07/09/2019   Procedure: CARDIOVERSION;  Surgeon: Sanda Klein, MD;  Location: [place];  Service: Cardiovascular;  Laterality: N/A;  . COLONOSCOPY W/ POLYPECTOMY  2006  . DILATION AND CURETTAGE OF UTERUS    . MOUTH SURGERY  2009-2010   implants, Dr. Marcelyn Ditty     Medications: Current Meds  Medication Sig  . acetaminophen (TYLENOL) 500 MG tablet Take 500 mg by mouth 2 (two) times daily as needed (arthritis pain).  Marland Kitchen ELIQUIS 5 MG TABS tablet TAKE 1 TABLET BY MOUTH  TWICE DAILY  . metoprolol succinate (TOPROL-XL) 50 MG 24 hr tablet Take 1 tablet (50 mg total) by mouth daily. TAKE 1 TABLET BY MOUTH  DAILY WITH OR IMMEDIATELY  FOLLOWING A MEAL.     Allergies: No Known Allergies  Social History: The patient  reports that she quit smoking about 42 years  ago. She has never used smokeless tobacco. She reports that she does not drink alcohol or use drugs.   Family History: The patient's family history includes Arthritis in an other family member; Cancer in her paternal aunt and sister; Coronary artery disease in her father and sister.   Review of Systems: Please see the history of present illness.   All other systems are reviewed and negative.   Physical Exam: VS:  BP 140/80   Pulse 66   Ht 5\' 4"  (1.626 m)   Wt 138 lb 12.8 oz (63 kg)   SpO2 97%   BMI 23.82 kg/m  .  BMI Body mass index is 23.82 kg/m.  Wt Readings from Last 3 Encounters:  10/13/19 138 lb 12.8 oz (63 kg)  09/09/19 139 lb 12.8 oz (63.4 kg)  07/28/19 143 lb (64.9 kg)    General: Pleasant. Alert  and in no acute distress. She looks younger than her stated age.    HEENT: Normal.  Neck: Supple, no JVD, carotid bruits, or masses noted.  Cardiac: Regular rate and rhythm. No murmurs, rubs, or gallops. No edema.  Respiratory:  Lungs are clear to auscultation bilaterally with normal work of breathing.  GI: Soft and nontender.  MS: No deformity or atrophy. Gait and ROM intact.  Skin: Warm and dry. Color is normal.  Neuro:  Strength and sensation are intact and no gross focal deficits noted.  Psych: Alert, appropriate and with normal affect.   LABORATORY DATA:  EKG:  EKG is ordered today. This demonstrates NSR.  Lab Results  Component Value Date   WBC 6.2 07/04/2019   HGB 14.0 07/04/2019   HCT 43.5 07/04/2019   PLT 210 07/04/2019   GLUCOSE 112 (H) 07/14/2019   CHOL 189 06/24/2019   TRIG 88 06/24/2019   HDL 54 06/24/2019   LDLCALC 117 (H) 06/24/2019   ALT 17 06/24/2019   AST 18 06/24/2019   NA 142 07/14/2019   K 4.3 07/14/2019   CL 106 07/14/2019   CREATININE 0.82 07/14/2019   BUN 15 07/14/2019   CO2 25 07/14/2019   TSH 2.840 06/24/2019   INR 1.1 07/04/2019   HGBA1C 5.8 12/22/2009     BNP (last 3 results) No results for input(s): BNP in the last 8760 hours.  ProBNP (last 3 results) No results for input(s): PROBNP in the last 8760 hours.   Other Studies Reviewed Today:  Procedure: Electrical Cardioversion Indications:Atrial Fibrillation  Procedure Details:  Consent:Risks of procedure as well as the alternatives and risks of each were explained to the (patient/caregiver). Consent for procedure obtained.  Time [id number] patient identification, verified procedure, site/side was marked, verified correct patient position, special equipment/implants available, medications/allergies/relevent history reviewed, required imaging and test results available. Performed  Patient placed on cardiac monitor, pulse oximetry, supplemental oxygen as necessary.    Sedation given:Propofol 60 mg IV, Dr. Valma Cava Pacer pads placedanterior and posterior chest.  Cardioverted1time(s).  Cardioversion with synchronized biphasic120Jshock.  Evaluation: Findings: Post procedure EKG shows:NSR Complications:None Patientdidtolerate procedure well.  Time Spent Directly with the Patient:  79minutes  Mihai Croitoru 07/09/2019,9:55 AM  Echo Study Conclusions 02/2015  - Left ventricle: The cavity size was normal. Wall thickness was normal. Systolic function was normal. The estimated ejection fraction was in the range of 55% to 60%.Wall motion was normal; there were no regional wall motion abnormalities. - Right ventricle: The cavity size was mildly dilated. - Right atrium: The atrium was mildly dilated. - Atrial septum: No defect or patent foramen  ovale was identified. - Tricuspid valve: There was moderate regurgitation.   Assessment/Plan:  1. PAF - prior cardioversion x 4 - last done in August of 2020 - this was associated with volume overload - she remains in NSR. May still need to consider AAD therapy if has recurrence.   2. Chronic anticoagulation - no problems.   3. Diastolic HF - looks compensated. Typically only an issue when she is having AF.   4. COVID-19 Education: The signs and symptoms of COVID-19 were discussed with the patient and how to seek care for testing (follow up with PCP or arrange E-visit).  The importance of social distancing, staying at home, hand hygiene and wearing a mask when out in public were discussed today.  Current medicines are reviewed with the patient today.  The patient does not have concerns regarding medicines other than what has been noted above.  The following changes have been made:  See above.  Labs/ tests ordered today include:    Orders Placed This Encounter  Procedures  . EKG 12-Lead     Disposition:   FU with me in 6 months.   Patient is agreeable to this plan and  will call if any problems develop in the interim.   SignedTruitt Merle, NP  10/13/2019 4:04 PM  [place] [address] [place], [place]  [address] Phone: [phone] Fax: [phone]

## 2019-10-13 ENCOUNTER — Ambulatory Visit: Payer: Medicare Other | Admitting: Nurse Practitioner

## 2019-10-13 ENCOUNTER — Other Ambulatory Visit: Payer: Self-pay

## 2019-10-13 ENCOUNTER — Encounter: Payer: Self-pay | Admitting: Nurse Practitioner

## 2019-10-13 VITALS — BP 140/80 | HR 66 | Ht 64.0 in | Wt 138.8 lb

## 2019-10-13 DIAGNOSIS — Z7901 Long term (current) use of anticoagulants: Secondary | ICD-10-CM | POA: Diagnosis not present

## 2019-10-13 DIAGNOSIS — Z7189 Other specified counseling: Secondary | ICD-10-CM | POA: Diagnosis not present

## 2019-10-13 DIAGNOSIS — Z9889 Other specified postprocedural states: Secondary | ICD-10-CM

## 2019-10-13 DIAGNOSIS — I10 Essential (primary) hypertension: Secondary | ICD-10-CM

## 2019-10-13 DIAGNOSIS — I480 Paroxysmal atrial fibrillation: Secondary | ICD-10-CM

## 2019-10-13 NOTE — Patient Instructions (Addendum)

## 2019-11-19 ENCOUNTER — Ambulatory Visit: Payer: Medicare Other | Attending: Internal Medicine

## 2019-11-19 DIAGNOSIS — Z23 Encounter for immunization: Secondary | ICD-10-CM | POA: Insufficient documentation

## 2019-11-19 NOTE — Progress Notes (Signed)
   Covid-19 Vaccination Clinic  Name:  Molly Tucker    MRN: [id number] DOB: 01-Sep-1932  11/19/2019  [name] was observed post Covid-19 immunization for 30 minutes based on pre-vaccination screening without incidence. She was provided with Vaccine Information Sheet and instruction to access the V-Safe system.   [name] was instructed to call 911 with any severe reactions post vaccine: Marland Kitchen Difficulty breathing  . Swelling of your face and throat  . A fast heartbeat  . A bad rash all over your body  . Dizziness and weakness    Immunizations Administered    Name Date Dose VIS Date Route   Pfizer COVID-19 Vaccine 11/19/2019  4:39 PM 0.3 mL 10/10/2019 Intramuscular   Manufacturer: [place]   Lot: [id number]   [place]: [id number]

## 2019-12-10 ENCOUNTER — Ambulatory Visit: Payer: Medicare Other | Attending: Internal Medicine

## 2019-12-10 DIAGNOSIS — Z23 Encounter for immunization: Secondary | ICD-10-CM

## 2019-12-10 NOTE — Progress Notes (Signed)
   Covid-19 Vaccination Clinic  Name:  Molly Tucker    MRN: [id number] DOB: 1932-08-28  12/10/2019  [name] was observed post Covid-19 immunization for 15 minutes without incidence. She was provided with Vaccine Information Sheet and instruction to access the V-Safe system.   [name] was instructed to call 911 with any severe reactions post vaccine: Marland Kitchen Difficulty breathing  . Swelling of your face and throat  . A fast heartbeat  . A bad rash all over your body  . Dizziness and weakness    Immunizations Administered    Name Date Dose VIS Date Route   Pfizer COVID-19 Vaccine 12/10/2019  9:31 AM 0.3 mL 10/10/2019 Intramuscular   Manufacturer: [place]   Lot: [id number]   [place]: [id number]

## 2020-01-07 ENCOUNTER — Encounter: Payer: Self-pay | Admitting: Nurse Practitioner

## 2020-01-07 ENCOUNTER — Telehealth: Payer: Self-pay

## 2020-01-07 ENCOUNTER — Other Ambulatory Visit: Payer: Self-pay

## 2020-01-07 ENCOUNTER — Ambulatory Visit: Payer: Medicare Other | Admitting: Nurse Practitioner

## 2020-01-07 VITALS — BP 142/82 | HR 77 | Ht 64.0 in | Wt 138.0 lb

## 2020-01-07 DIAGNOSIS — I480 Paroxysmal atrial fibrillation: Secondary | ICD-10-CM

## 2020-01-07 DIAGNOSIS — Z9889 Other specified postprocedural states: Secondary | ICD-10-CM | POA: Diagnosis not present

## 2020-01-07 DIAGNOSIS — Z7189 Other specified counseling: Secondary | ICD-10-CM | POA: Diagnosis not present

## 2020-01-07 DIAGNOSIS — Z7901 Long term (current) use of anticoagulants: Secondary | ICD-10-CM | POA: Diagnosis not present

## 2020-01-07 DIAGNOSIS — I10 Essential (primary) hypertension: Secondary | ICD-10-CM

## 2020-01-07 NOTE — Telephone Encounter (Signed)
I spoke to the patient who sent a My Chart message stating that she is SOB and thinks that she is back in A Fib.  I scheduled her with [name] @ 3:15 to further evaluate.

## 2020-01-07 NOTE — H&P (View-Only) (Signed)
CARDIOLOGY OFFICE NOTE  Date:  01/07/2020    Molly Tucker Date of Birth: July 19, 1932 Medical Record [id number]  PCP:  Binnie Rail, MD  Cardiologist:  Ree Shay  Chief Complaint  Patient presents with  . Palpitations  . Shortness of Breath    Work in visit    History of Present Illness: Molly Tucker is a 86 y.o. female who presents today for a work in visit. Seen for [name].Primarily follows with me.  She has a history of PAF, HTN, HLD, and osteoporosis. She has had prior cardioversions back to NSR - with improvement in symptoms. Cardioverted in June of 2016, February of 2017, March of 2018 and September of 2020. She tends to have worsening volume overload when in AF.   Last seen in December - she was doing well - was in NSR at that time.   Phone call from earlier today - concern for recurrent AF. Thus added to my schedule.   The patient does not have symptoms concerning for COVID-19 infection (fever, chills, cough, or new shortness of breath).   Comes in today. Here alone. She notes that for the past several days, she felt like she was back in AF. Breathless with activity and fatigued. HR is not fast. She remains on her Eliquis - no missed doses. Asking about other options. No real trigger that she can identify.   Past Medical History:  Diagnosis Date  . Atrial tachycardia ([place])   . Carotid bruit 2005   right, carotid doppler: <39% occlusion  . Hyperlipidemia    LDL goal =<120  . Hyperplastic colonic polyp 2006   Dr. Carlean Purl, due 2016  . Hypertension   . Osteoporosis    S/P  biphosphonates x 5 yrs  . Persistent atrial fibrillation [facility])     Past Surgical History:  Procedure Laterality Date  . CARDIOVERSION N/A 03/31/2015   Procedure: CARDIOVERSION;  Surgeon: Sanda Klein, MD;  Location: [facility] ENDOSCOPY;  Service: Cardiovascular;  Laterality: N/A;  . CARDIOVERSION N/A 12/21/2015   Procedure: CARDIOVERSION;  Surgeon: Lelon Perla, MD;   Location: [facility] ENDOSCOPY;  Service: Cardiovascular;  Laterality: N/A;  . CARDIOVERSION N/A 01/09/2017   Procedure: CARDIOVERSION;  Surgeon: Jerline Pain, MD;  Location: [place];  Service: Cardiovascular;  Laterality: N/A;  . CARDIOVERSION N/A 07/09/2019   Procedure: CARDIOVERSION;  Surgeon: Sanda Klein, MD;  Location: [place];  Service: Cardiovascular;  Laterality: N/A;  . COLONOSCOPY W/ POLYPECTOMY  2006  . DILATION AND CURETTAGE OF UTERUS    . MOUTH SURGERY  2009-2010   implants, Dr. Marcelyn Ditty     Medications: No outpatient medications have been marked as taking for the 01/07/20 encounter (Office Visit) with Burtis Junes, NP.     Allergies: No Known Allergies  Social History: The patient  reports that she quit smoking about 43 years ago. She has never used smokeless tobacco. She reports that she does not drink alcohol or use drugs.   Family History: The patient's family history includes Arthritis in an other family member; Cancer in her paternal aunt and sister; Coronary artery disease in her father and sister.   Review of Systems: Please see the history of present illness.   All other systems are reviewed and negative.   Physical Exam: VS:  BP (!) 142/82   Pulse 77   Ht 5\' 4"  (1.626 m)   Wt 138 lb (62.6 kg)   SpO2 98%   BMI 23.69  kg/m  .  BMI Body mass index is 23.69 kg/m.  Wt Readings from Last 3 Encounters:  01/07/20 138 lb (62.6 kg)  10/13/19 138 lb 12.8 oz (63 kg)  09/09/19 139 lb 12.8 oz (63.4 kg)    General: Pleasant. She looks younger than her stated age. She is alert and in no acute distress.   Cardiac: Irregular irregular rhythm - her rate is fine. Trace edema.  Respiratory:  Lungs are clear to auscultation bilaterally with normal work of breathing.  GI: Soft and nontender.  MS: No deformity or atrophy. Gait and ROM intact.  Skin: Warm and dry. Color is normal.  Neuro:  Strength and sensation are intact and no gross focal deficits noted.    Psych: Alert, appropriate and with normal affect.   LABORATORY DATA:  EKG:  EKG is ordered today. This demonstrates recurrent AF with controlled VR of 77.  Lab Results  Component Value Date   WBC 6.2 07/04/2019   HGB 14.0 07/04/2019   HCT 43.5 07/04/2019   PLT 210 07/04/2019   GLUCOSE 112 (H) 07/14/2019   CHOL 189 06/24/2019   TRIG 88 06/24/2019   HDL 54 06/24/2019   LDLCALC 117 (H) 06/24/2019   ALT 17 06/24/2019   AST 18 06/24/2019   NA 142 07/14/2019   K 4.3 07/14/2019   CL 106 07/14/2019   CREATININE 0.82 07/14/2019   BUN 15 07/14/2019   CO2 25 07/14/2019   TSH 2.840 06/24/2019   INR 1.1 07/04/2019   HGBA1C 5.8 12/22/2009     BNP (last 3 results) No results for input(s): BNP in the last 8760 hours.  ProBNP (last 3 results) No results for input(s): PROBNP in the last 8760 hours.   Other Studies Reviewed Today:  Procedure: Electrical Cardioversion 07/2019 Indications:Atrial Fibrillation  Procedure Details:  Consent:Risks of procedure as well as the alternatives and risks of each were explained to the (patient/caregiver). Consent for procedure obtained.  Time [id number] patient identification, verified procedure, site/side was marked, verified correct patient position, special equipment/implants available, medications/allergies/relevent history reviewed, required imaging and test results available. Performed  Patient placed on cardiac monitor, pulse oximetry, supplemental oxygen as necessary.  Sedation given:Propofol 60 mg IV, Dr. Valma Cava Pacer pads placedanterior and posterior chest.  Cardioverted1time(s).  Cardioversion with synchronized biphasic120Jshock.  Evaluation: Findings: Post procedure EKG shows:NSR Complications:None Patientdidtolerate procedure well.  Time Spent Directly with the Patient:  4minutes  Mihai Croitoru 07/09/2019,9:55 AM  Echo Study Conclusions 02/2015  - Left ventricle: The cavity size was  normal. Wall thickness was normal. Systolic function was normal. The estimated ejection fraction was in the range of 55% to 60%.Wall motion was normal; there were no regional wall motion abnormalities. - Right ventricle: The cavity size was mildly dilated. - Right atrium: The atrium was mildly dilated. - Atrial septum: No defect or patent foramen ovale was identified. - Tricuspid valve: There was moderate regurgitation.   Assessment/Plan:  1. Recurrent AF - she has had prior cardioversion x 4 - last in September of 2020 - she tends to have associated diastolic heart failure - suspect due to loss of atrial kick - will arrange for repeat cardioversion - echo once we restore NSR and then referral to AF clinic to discuss AAD options - she is the primary caregiver for her husband - Tikosyn admission would NOT be a suitable option for her given his circumstances - he happens to be on amiodarone.   Cardioversion arranged for next Wednesday at 1:00 PM with  Dr. Audie Box. Will do COVID test next Monday AM and then quarantine at home. Echo the following week and then AF consult with Roderic Palau, NP. Procedure, risks and benefits reviewed and she is willing to proceed.   2. Chronic anticoagulation - no missed doses  3. Diastolic HF - so far, she seems ok - she does have Lasix at home if needed - this tends to get exacerbated following cardioversion.   4. HTN - BP today noted - typically has good control at home. Would follow.   5. COVID-19 Education: The signs and symptoms of COVID-19 were discussed with the patient and how to seek care for testing (follow up with PCP or arrange E-visit).  The importance of social distancing, staying at home, hand hygiene and wearing a mask when out in public were discussed today. She has had her vaccine.   Current medicines are reviewed with the patient today.  The patient does not have concerns regarding medicines other than what has been noted above.  The  following changes have been made:  See above.  Labs/ tests ordered today include:    Orders Placed This Encounter  Procedures  . Basic metabolic panel  . CBC  . PT and PTT  . EKG 12-Lead  . ECHOCARDIOGRAM COMPLETE     Disposition:   FU as noted above. I will see back as planned.   Patient is agreeable to this plan and will call if any problems develop in the interim.   SignedTruitt Merle, NP  01/07/2020 4:29 PM  [place] [address] [place], [place]  [address] Phone: [phone] Fax: [phone]

## 2020-01-07 NOTE — Progress Notes (Signed)
CARDIOLOGY OFFICE NOTE  Date:  01/07/2020    Molly Tucker Date of Birth: 03-15-1932 Medical Record [id number]  PCP:  Binnie Rail, MD  Cardiologist:  Ree Shay  Chief Complaint  Patient presents with  . Palpitations  . Shortness of Breath    Work in visit    History of Present Illness: Molly Tucker is a 86 y.o. female who presents today for a work in visit. Seen for [name].Primarily follows with me.  She has a history of PAF, HTN, HLD, and osteoporosis. She has had prior cardioversions back to NSR - with improvement in symptoms. Cardioverted in June of 2016, February of 2017, March of 2018 and September of 2020. She tends to have worsening volume overload when in AF.   Last seen in December - she was doing well - was in NSR at that time.   Phone call from earlier today - concern for recurrent AF. Thus added to my schedule.   The patient does not have symptoms concerning for COVID-19 infection (fever, chills, cough, or new shortness of breath).   Comes in today. Here alone. She notes that for the past several days, she felt like she was back in AF. Breathless with activity and fatigued. HR is not fast. She remains on her Eliquis - no missed doses. Asking about other options. No real trigger that she can identify.   Past Medical History:  Diagnosis Date  . Atrial tachycardia ([place])   . Carotid bruit 2005   right, carotid doppler: <39% occlusion  . Hyperlipidemia    LDL goal =<120  . Hyperplastic colonic polyp 2006   Dr. Carlean Purl, due 2016  . Hypertension   . Osteoporosis    S/P  biphosphonates x 5 yrs  . Persistent atrial fibrillation [facility])     Past Surgical History:  Procedure Laterality Date  . CARDIOVERSION N/A 03/31/2015   Procedure: CARDIOVERSION;  Surgeon: Sanda Klein, MD;  Location: [facility] ENDOSCOPY;  Service: Cardiovascular;  Laterality: N/A;  . CARDIOVERSION N/A 12/21/2015   Procedure: CARDIOVERSION;  Surgeon: Lelon Perla, MD;   Location: [facility] ENDOSCOPY;  Service: Cardiovascular;  Laterality: N/A;  . CARDIOVERSION N/A 01/09/2017   Procedure: CARDIOVERSION;  Surgeon: Jerline Pain, MD;  Location: [place];  Service: Cardiovascular;  Laterality: N/A;  . CARDIOVERSION N/A 07/09/2019   Procedure: CARDIOVERSION;  Surgeon: Sanda Klein, MD;  Location: [place];  Service: Cardiovascular;  Laterality: N/A;  . COLONOSCOPY W/ POLYPECTOMY  2006  . DILATION AND CURETTAGE OF UTERUS    . MOUTH SURGERY  2009-2010   implants, Dr. Marcelyn Ditty     Medications: No outpatient medications have been marked as taking for the 01/07/20 encounter (Office Visit) with Burtis Junes, NP.     Allergies: No Known Allergies  Social History: The patient  reports that she quit smoking about 43 years ago. She has never used smokeless tobacco. She reports that she does not drink alcohol or use drugs.   Family History: The patient's family history includes Arthritis in an other family member; Cancer in her paternal aunt and sister; Coronary artery disease in her father and sister.   Review of Systems: Please see the history of present illness.   All other systems are reviewed and negative.   Physical Exam: VS:  BP (!) 142/82   Pulse 77   Ht 5\' 4"  (1.626 m)   Wt 138 lb (62.6 kg)   SpO2 98%   BMI 23.69  kg/m  .  BMI Body mass index is 23.69 kg/m.  Wt Readings from Last 3 Encounters:  01/07/20 138 lb (62.6 kg)  10/13/19 138 lb 12.8 oz (63 kg)  09/09/19 139 lb 12.8 oz (63.4 kg)    General: Pleasant. She looks younger than her stated age. She is alert and in no acute distress.   Cardiac: Irregular irregular rhythm - her rate is fine. Trace edema.  Respiratory:  Lungs are clear to auscultation bilaterally with normal work of breathing.  GI: Soft and nontender.  MS: No deformity or atrophy. Gait and ROM intact.  Skin: Warm and dry. Color is normal.  Neuro:  Strength and sensation are intact and no gross focal deficits noted.    Psych: Alert, appropriate and with normal affect.   LABORATORY DATA:  EKG:  EKG is ordered today. This demonstrates recurrent AF with controlled VR of 77.  Lab Results  Component Value Date   WBC 6.2 07/04/2019   HGB 14.0 07/04/2019   HCT 43.5 07/04/2019   PLT 210 07/04/2019   GLUCOSE 112 (H) 07/14/2019   CHOL 189 06/24/2019   TRIG 88 06/24/2019   HDL 54 06/24/2019   LDLCALC 117 (H) 06/24/2019   ALT 17 06/24/2019   AST 18 06/24/2019   NA 142 07/14/2019   K 4.3 07/14/2019   CL 106 07/14/2019   CREATININE 0.82 07/14/2019   BUN 15 07/14/2019   CO2 25 07/14/2019   TSH 2.840 06/24/2019   INR 1.1 07/04/2019   HGBA1C 5.8 12/22/2009     BNP (last 3 results) No results for input(s): BNP in the last 8760 hours.  ProBNP (last 3 results) No results for input(s): PROBNP in the last 8760 hours.   Other Studies Reviewed Today:  Procedure: Electrical Cardioversion 07/2019 Indications:Atrial Fibrillation  Procedure Details:  Consent:Risks of procedure as well as the alternatives and risks of each were explained to the (patient/caregiver). Consent for procedure obtained.  Time [id number] patient identification, verified procedure, site/side was marked, verified correct patient position, special equipment/implants available, medications/allergies/relevent history reviewed, required imaging and test results available. Performed  Patient placed on cardiac monitor, pulse oximetry, supplemental oxygen as necessary.  Sedation given:Propofol 60 mg IV, Dr. Valma Cava Pacer pads placedanterior and posterior chest.  Cardioverted1time(s).  Cardioversion with synchronized biphasic120Jshock.  Evaluation: Findings: Post procedure EKG shows:NSR Complications:None Patientdidtolerate procedure well.  Time Spent Directly with the Patient:  19minutes  Mihai Croitoru 07/09/2019,9:55 AM  Echo Study Conclusions 02/2015  - Left ventricle: The cavity size was  normal. Wall thickness was normal. Systolic function was normal. The estimated ejection fraction was in the range of 55% to 60%.Wall motion was normal; there were no regional wall motion abnormalities. - Right ventricle: The cavity size was mildly dilated. - Right atrium: The atrium was mildly dilated. - Atrial septum: No defect or patent foramen ovale was identified. - Tricuspid valve: There was moderate regurgitation.   Assessment/Plan:  1. Recurrent AF - she has had prior cardioversion x 4 - last in September of 2020 - she tends to have associated diastolic heart failure - suspect due to loss of atrial kick - will arrange for repeat cardioversion - echo once we restore NSR and then referral to AF clinic to discuss AAD options - she is the primary caregiver for her husband - Tikosyn admission would NOT be a suitable option for her given his circumstances - he happens to be on amiodarone.   Cardioversion arranged for next Wednesday at 1:00 PM with  Dr. Audie Box. Will do COVID test next Monday AM and then quarantine at home. Echo the following week and then AF consult with Roderic Palau, NP. Procedure, risks and benefits reviewed and she is willing to proceed.   2. Chronic anticoagulation - no missed doses  3. Diastolic HF - so far, she seems ok - she does have Lasix at home if needed - this tends to get exacerbated following cardioversion.   4. HTN - BP today noted - typically has good control at home. Would follow.   5. COVID-19 Education: The signs and symptoms of COVID-19 were discussed with the patient and how to seek care for testing (follow up with PCP or arrange E-visit).  The importance of social distancing, staying at home, hand hygiene and wearing a mask when out in public were discussed today. She has had her vaccine.   Current medicines are reviewed with the patient today.  The patient does not have concerns regarding medicines other than what has been noted above.  The  following changes have been made:  See above.  Labs/ tests ordered today include:    Orders Placed This Encounter  Procedures  . Basic metabolic panel  . CBC  . PT and PTT  . EKG 12-Lead  . ECHOCARDIOGRAM COMPLETE     Disposition:   FU as noted above. I will see back as planned.   Patient is agreeable to this plan and will call if any problems develop in the interim.   SignedTruitt Merle, NP  01/07/2020 4:29 PM  [facility] [address] [place], [place]  [address] Phone: [phone] Fax: [phone]

## 2020-01-07 NOTE — Patient Instructions (Addendum)
After Visit Summary:  We will be checking the following labs today - BMET, CBC, PT and PTT   Medication Instructions:    Continue with your current medicines.    If you need a refill on your cardiac medications before your next appointment, please call your pharmacy.     Testing/Procedures To Be Arranged:  Cardioversion next week.  Echocardiogram in 2 weeks  Follow-Up:   See Roderic Palau, NP in the AF clinic in the next 2 to 3 weeks please for consideration of antiarrhythmic therapy - she has seen her in the remote past.   I will see you back as planned    At [facility], you and your health needs are our priority.  As part of our continuing mission to provide you with exceptional heart care, we have created designated Provider Care Teams.  These Care Teams include your primary Cardiologist (physician) and Advanced Practice Providers (APPs -  Physician Assistants and Nurse Practitioners) who all work together to provide you with the care you need, when you need it.  Special Instructions:  . Stay safe, stay home, wash your hands for at least 20 seconds and wear a mask when out in public.  . It was good to talk with you today.    Your provider has recommended a cardioversion.   COVID SCREENING INFORMATION: You are scheduled for your COVID screening on Monday AM, March 15th, 2021 at __12:10__. And then go home to [facility] ([facility]) [address] Stay in the RIGHT lane and proceed under the brick awning (NOT the tent) and tell them you are there for pre-procedure testing Do NOT bring any pets with you to the testing site   You are scheduled for a cardioversion on Wednesday, March 17, at 1:00 PM with Dr. Davina Poke or associates. Please go to [facility] ([address] 2nd [place] Stay at Wednesday, March 17th at 11:30 AM.  There is free valet parking available.  Enter through the [place]  not have any food or drink after midnight on next Tuesday.  You may take your medicines with a sip of water on the day of your procedure.    DO NOT STOP YOUR ANTICOAGULANT (BLOOD THINNER)   You will need someone to drive you home following your procedure and stay in the waiting room during your procedure. Failure to do so could result in your procedure being cancelled.   Every effort is made to have your procedure done on time. Occasionally there are emergencies that occur at the hospital that may cause delays.   Call the [place] office at [phone] if you have any questions, problems or concerns.     Electrical Cardioversion Electrical cardioversion is the delivery of a jolt of electricity to change the rhythm of the heart. Sticky patches or metal paddles are placed on the chest to deliver the electricity from a device. This is done to restore a normal rhythm. A rhythm that is too fast or not regular keeps the heart from pumping well. Electrical cardioversion is done in an emergency if:   There is low or no blood pressure as a result of the heart rhythm.    Normal rhythm must be restored as fast as possible to protect the brain and heart from further damage.    It may save a life. Cardioversion may be done for heart rhythms that are not immediately life  threatening, such as atrial fibrillation or flutter, in which:   The heart is beating too fast or is not regular.    Medicine to change the rhythm has not worked.    It is safe to wait in order to allow time for preparation.  Symptoms of the abnormal rhythm are bothersome.  The risk of stroke and other serious problems can be reduced.  LET [facility] CARE PROVIDER KNOW ABOUT:   Any allergies you have.  All medicines you are taking, including vitamins, herbs, eye drops, creams, and over-the-counter medicines.  Previous problems you or members of your family have had with the use of  anesthetics.    Any blood disorders you have.    Previous surgeries you have had.    Medical conditions you have.   RISKS AND COMPLICATIONS  Generally, this is a safe procedure. However, problems can occur and include:   Breathing problems related to the anesthetic used.  A blood clot that breaks free and travels to other parts of your body. This could cause a stroke or other problems. The risk of this is lowered by use of blood-thinning medicine (anticoagulant) prior to the procedure.  Cardiac arrest (rare).   BEFORE THE PROCEDURE   You may have tests to detect blood clots in your heart and to evaluate heart function.   You may start taking anticoagulants so your blood does not clot as easily.    Medicines may be given to help stabilize your heart rate and rhythm.   PROCEDURE  You will be given medicine through an IV tube to reduce discomfort and make you sleepy (sedative).    An electrical shock will be delivered.   AFTER THE PROCEDURE Your heart rhythm will be watched to make sure it does not change. You will need someone to drive you home.      Call the [place] office at [phone] if you have any questions, problems or concerns.

## 2020-01-12 ENCOUNTER — Other Ambulatory Visit (HOSPITAL_COMMUNITY)
Admission: RE | Admit: 2020-01-12 | Discharge: 2020-01-12 | Disposition: A | Discharge status: Home / Self Care | Payer: Medicare Other | Source: Ambulatory Visit | Attending: Cardiovascular Disease | Admitting: Cardiovascular Disease

## 2020-01-12 DIAGNOSIS — Z20822 Contact with and (suspected) exposure to covid-19: Secondary | ICD-10-CM | POA: Diagnosis not present

## 2020-01-12 DIAGNOSIS — Z01812 Encounter for preprocedural laboratory examination: Secondary | ICD-10-CM | POA: Insufficient documentation

## 2020-01-12 LAB — CBC
Hematocrit: 42.5 % (ref 34.0–46.6)
Hemoglobin: 14 g/dL (ref 11.1–15.9)
MCH: 30.6 pg (ref 26.6–33.0)
MCHC: 32.9 g/dL (ref 31.5–35.7)
MCV: 93 fL (ref 79–97)
Platelets: 188 10*3/uL (ref 150–450)
RBC: 4.57 x10E6/uL (ref 3.77–5.28)
RDW: 12.5 % (ref 11.7–15.4)
WBC: 5.9 10*3/uL (ref 3.4–10.8)

## 2020-01-12 LAB — BASIC METABOLIC PANEL
BUN/Creatinine Ratio: 20 (ref 12–28)
BUN: 17 mg/dL (ref 8–27)
CO2: 24 mmol/L (ref 20–29)
Calcium: 9.3 mg/dL (ref 8.7–10.3)
Chloride: 107 mmol/L — ABNORMAL HIGH (ref 96–106)
Creatinine, Ser: 0.83 mg/dL (ref 0.57–1.00)
GFR calc Af Amer: 74 mL/min/{1.73_m2} (ref >59)
GFR calc non Af Amer: 64 mL/min/{1.73_m2} (ref >59)
Glucose: 89 mg/dL (ref 65–99)
Potassium: 5 mmol/L (ref 3.5–5.2)
Sodium: 143 mmol/L (ref 134–144)

## 2020-01-12 LAB — PT AND PTT
INR: 1 (ref 0.9–1.2)
Prothrombin Time: 11.1 s (ref 9.1–12.0)
aPTT: 30 s (ref 24–33)

## 2020-01-12 LAB — SARS CORONAVIRUS 2 (TAT 6-24 HRS): SARS Coronavirus 2: NEGATIVE

## 2020-01-14 ENCOUNTER — Ambulatory Visit (HOSPITAL_COMMUNITY)
Admission: RE | Admit: 2020-01-14 | Discharge: 2020-01-14 | Disposition: A | Discharge status: Home / Self Care | Payer: Medicare Other | Attending: Cardiovascular Disease | Admitting: Cardiovascular Disease

## 2020-01-14 ENCOUNTER — Ambulatory Visit (HOSPITAL_COMMUNITY): Payer: Medicare Other | Admitting: Certified Registered Nurse Anesthetist

## 2020-01-14 ENCOUNTER — Encounter (HOSPITAL_COMMUNITY)
Admission: RE | Disposition: A | Discharge status: Home / Self Care | Payer: Self-pay | Source: Home / Self Care | Attending: Cardiovascular Disease

## 2020-01-14 ENCOUNTER — Encounter (HOSPITAL_COMMUNITY): Payer: Self-pay | Admitting: Cardiovascular Disease

## 2020-01-14 ENCOUNTER — Other Ambulatory Visit: Payer: Self-pay

## 2020-01-14 DIAGNOSIS — E785 Hyperlipidemia, unspecified: Secondary | ICD-10-CM | POA: Diagnosis not present

## 2020-01-14 DIAGNOSIS — I480 Paroxysmal atrial fibrillation: Secondary | ICD-10-CM

## 2020-01-14 DIAGNOSIS — I4891 Unspecified atrial fibrillation: Secondary | ICD-10-CM | POA: Diagnosis not present

## 2020-01-14 DIAGNOSIS — Z7901 Long term (current) use of anticoagulants: Secondary | ICD-10-CM | POA: Diagnosis not present

## 2020-01-14 DIAGNOSIS — I4819 Other persistent atrial fibrillation: Secondary | ICD-10-CM | POA: Insufficient documentation

## 2020-01-14 DIAGNOSIS — I10 Essential (primary) hypertension: Secondary | ICD-10-CM | POA: Diagnosis not present

## 2020-01-14 DIAGNOSIS — Z87891 Personal history of nicotine dependence: Secondary | ICD-10-CM | POA: Insufficient documentation

## 2020-01-14 DIAGNOSIS — I110 Hypertensive heart disease with heart failure: Secondary | ICD-10-CM | POA: Diagnosis not present

## 2020-01-14 DIAGNOSIS — I5032 Chronic diastolic (congestive) heart failure: Secondary | ICD-10-CM | POA: Diagnosis not present

## 2020-01-14 DIAGNOSIS — Z9889 Other specified postprocedural states: Secondary | ICD-10-CM

## 2020-01-14 HISTORY — PX: CARDIOVERSION: SHX1299

## 2020-01-14 SURGERY — CARDIOVERSION
Anesthesia: General

## 2020-01-14 MED ORDER — LIDOCAINE 2% (20 MG/ML) 5 ML SYRINGE
INTRAMUSCULAR | Status: DC | PRN
  Administered 2020-01-14: 60 mg via INTRAVENOUS

## 2020-01-14 MED ORDER — SODIUM CHLORIDE 0.9 % IV SOLN: INTRAVENOUS | Status: DC

## 2020-01-14 MED ORDER — PROPOFOL 10 MG/ML IV BOLUS
INTRAVENOUS | Status: DC | PRN
  Administered 2020-01-14: 60 mg via INTRAVENOUS

## 2020-01-14 NOTE — Discharge Instructions (Signed)
Electrical Cardioversion   What can I expect after the procedure?  Your blood pressure, heart rate, breathing rate, and blood oxygen level will be monitored until you leave the hospital or clinic.  Your heart rhythm will be watched to make sure it does not change.  You may have some redness on the skin where the shocks were given. Follow these instructions at home:  Do not drive for 24 hours if you were given a sedative during your procedure.  Take over-the-counter and prescription medicines only as told by your health care provider.  Ask your health care provider how to check your pulse. Check it often.  Rest for 48 hours after the procedure or as told by your health care provider.  Avoid or limit your caffeine use as told by your health care provider.  Keep all follow-up visits as told by your health care provider. This is important. Contact a health care provider if:  You feel like your heart is beating too quickly or your pulse is not regular.  You have a serious muscle cramp that does not go away. Get help right away if:  You have discomfort in your chest.  You are dizzy or you feel faint.  You have trouble breathing or you are short of breath.  Your speech is slurred.  You have trouble moving an arm or leg on one side of your body.  Your fingers or toes turn cold or blue. Summary  Electrical cardioversion is the delivery of a jolt of electricity to restore a normal rhythm to the heart.  This procedure may be done right away in an emergency or may be a scheduled procedure if the condition is not an emergency.  Generally, this is a safe procedure.  After the procedure, check your pulse often as told by your health care provider. This information is not intended to replace advice given to you by your health care provider. Make sure you discuss any questions you have with your health care provider. Document Revised: 05/19/2019 Document Reviewed:  05/19/2019 Elsevier Patient Education  2020 Elsevier Inc.  

## 2020-01-14 NOTE — Transfer of Care (Signed)
Immediate Anesthesia Transfer of Care Note  Patient: Molly Tucker  Procedure(s) Performed: CARDIOVERSION (N/A )  Patient Location: Endoscopy Unit  Anesthesia Type:General  Level of Consciousness: awake  Airway & Oxygen Therapy: Patient Spontanous Breathing and Patient connected to nasal cannula oxygen  Post-op Assessment: Report given to RN and Post -op Vital signs reviewed and stable  Post vital signs: Reviewed  Last Vitals:  Vitals Value Taken Time  BP    Temp    Pulse 53 01/14/20 1205  Resp 24 01/14/20 1205  SpO2 100 % 01/14/20 1205  Vitals shown include unvalidated device data.  Last Pain:  Vitals:   01/14/20 1115  TempSrc: Oral  PainSc: 0-No pain         Complications: No apparent anesthesia complications

## 2020-01-14 NOTE — CV Procedure (Signed)
   DIRECT CURRENT CARDIOVERSION  NAME:  Molly Tucker    MRN: [id number] DOB:  Jul 04, 1932    ADMIT DATE: 01/14/2020  Indication:  Symptomatic atrial fibrillation  Procedure Note:  The patient signed informed consent.  They have had had therapeutic anticoagulation with eliquis greater than 3 weeks.  Anesthesia was administered by Dr. Fransisco Beau.  Adequate airway was maintained throughout and vital followed per protocol.  Mrs. Surgeon underwent 1 unsuccessful cardioversion. On attempt #2, she briefly converted to NSR and then returned to atrial fibrillation. Both attempts were performed with 200J of biphasic synchronized energy.  There were no apparent complications.  The patient had normal neuro status and respiratory status post procedure with vitals stable as recorded elsewhere.    Follow up: They will continue on current medical therapy and follow up with cardiology as scheduled. She will likely need antiarrhythmics to maintain NSR.   [place] T. Audie Box, [place]  [address] [place], [place] [address] [phone]  12:00 PM

## 2020-01-14 NOTE — Anesthesia Procedure Notes (Signed)
Procedure Name: General with mask airway Date/Time: 01/14/2020 11:55 AM Performed by: Janene Harvey, CRNA Pre-anesthesia Checklist: Patient identified, Emergency Drugs available, Suction available and Patient being monitored Dental Injury: Teeth and Oropharynx as per pre-operative assessment

## 2020-01-14 NOTE — Anesthesia Preprocedure Evaluation (Addendum)
Anesthesia Evaluation  Patient identified by MRN, date of birth, ID band Patient awake    Reviewed: Allergy & Precautions, NPO status , Patient's Chart, lab work & pertinent test results  History of Anesthesia Complications Negative for: history of anesthetic complications  Airway Mallampati: II  TM Distance: >3 FB Neck ROM: Full    Dental  (+) Dental Advisory Given, Teeth Intact   Pulmonary former smoker,    Pulmonary exam normal        Cardiovascular hypertension, Pt. on home beta blockers and Pt. on medications + dysrhythmias Atrial Fibrillation + Valvular Problems/Murmurs  Rhythm:Irregular Rate:Tachycardia   '16 TTE - EF 55% to 60%. RV size was mildly dilated. RA was mildly dilated. Moderate TR.    Neuro/Psych negative neurological ROS  negative psych ROS   GI/Hepatic negative GI ROS, Neg liver ROS,   Endo/Other  negative endocrine ROS  Renal/GU negative Renal ROS     Musculoskeletal  (+) Arthritis ,   Abdominal   Peds  Hematology  On eliquis    Anesthesia Other Findings Covid neg 01/12/20   Reproductive/Obstetrics                           Anesthesia Physical Anesthesia Plan  ASA: III  Anesthesia Plan: General   Post-op Pain Management:    Induction: Intravenous  PONV Risk Score and Plan: 3 and Treatment may vary due to age or medical condition and Propofol infusion  Airway Management Planned: Mask and Natural Airway  Additional Equipment: None  Intra-op Plan:   Post-operative Plan:   Informed Consent: I have reviewed the patients History and Physical, chart, labs and discussed the procedure including the risks, benefits and alternatives for the proposed anesthesia with the patient or authorized representative who has indicated his/her understanding and acceptance.       Plan Discussed with: CRNA and Anesthesiologist  Anesthesia Plan Comments:         Anesthesia Quick Evaluation

## 2020-01-14 NOTE — Interval H&P Note (Signed)
History and Physical Interval Note:  01/14/2020 11:44 AM  Molly Tucker  has presented today for surgery, with the diagnosis of AFIB.  The various methods of treatment have been discussed with the patient and family. After consideration of risks, benefits and other options for treatment, the patient has consented to  Procedure(s): CARDIOVERSION (N/A) as a surgical intervention.  The patient's history has been reviewed, patient examined, no change in status, stable for surgery.  I have reviewed the patient's chart and labs.  Questions were answered to the patient's satisfaction.     Symptomatic Afib. No missed doses of eliquis in last 3 weeks.   [place] T. Audie Box, [place]  [address] [employer] [place], [place] [address] [phone]  11:44 AM

## 2020-01-15 NOTE — Anesthesia Postprocedure Evaluation (Signed)
Anesthesia Post Note  Patient: Molly Tucker  Procedure(s) Performed: CARDIOVERSION (N/A )     Patient location during evaluation: PACU Anesthesia Type: General Level of consciousness: awake and alert Pain management: pain level controlled Vital Signs Assessment: post-procedure vital signs reviewed and stable Respiratory status: spontaneous breathing, nonlabored ventilation and respiratory function stable Cardiovascular status: blood pressure returned to baseline and stable Postop Assessment: no apparent nausea or vomiting Anesthetic complications: no    Last Vitals:  Vitals:   01/14/20 1215 01/14/20 1224  BP: (!) 111/56 132/76  Pulse: 66 (!) 59  Resp: (!) 24 20  Temp:    SpO2: 100% 97%    Last Pain:  Vitals:   01/15/20 1007  TempSrc:   PainSc: 0-No pain                 Audry Pili

## 2020-01-21 ENCOUNTER — Other Ambulatory Visit: Payer: Self-pay

## 2020-01-21 ENCOUNTER — Ambulatory Visit (HOSPITAL_COMMUNITY)
Admission: RE | Admit: 2020-01-21 | Discharge: 2020-01-21 | Disposition: A | Discharge status: Home / Self Care | Payer: Medicare Other | Source: Ambulatory Visit | Attending: Nurse Practitioner | Admitting: Nurse Practitioner

## 2020-01-21 ENCOUNTER — Encounter (HOSPITAL_COMMUNITY): Payer: Self-pay | Admitting: Nurse Practitioner

## 2020-01-21 DIAGNOSIS — I4819 Other persistent atrial fibrillation: Secondary | ICD-10-CM | POA: Insufficient documentation

## 2020-01-21 DIAGNOSIS — M810 Age-related osteoporosis without current pathological fracture: Secondary | ICD-10-CM | POA: Diagnosis not present

## 2020-01-21 DIAGNOSIS — I480 Paroxysmal atrial fibrillation: Secondary | ICD-10-CM | POA: Diagnosis not present

## 2020-01-21 DIAGNOSIS — E785 Hyperlipidemia, unspecified: Secondary | ICD-10-CM | POA: Diagnosis not present

## 2020-01-21 DIAGNOSIS — I110 Hypertensive heart disease with heart failure: Secondary | ICD-10-CM | POA: Diagnosis not present

## 2020-01-21 DIAGNOSIS — Z7901 Long term (current) use of anticoagulants: Secondary | ICD-10-CM

## 2020-01-21 DIAGNOSIS — I5032 Chronic diastolic (congestive) heart failure: Secondary | ICD-10-CM | POA: Diagnosis not present

## 2020-01-21 DIAGNOSIS — Z87891 Personal history of nicotine dependence: Secondary | ICD-10-CM | POA: Insufficient documentation

## 2020-01-21 MED ORDER — METOPROLOL SUCCINATE ER 25 MG PO TB24: 25.0000 mg | ORAL_TABLET | Freq: Every day | ORAL | 3 refills | Status: DC

## 2020-01-21 MED ORDER — FUROSEMIDE 40 MG PO TABS: 20.0000 mg | ORAL_TABLET | Freq: Every day | ORAL | Status: DC

## 2020-01-21 MED ORDER — AMIODARONE HCL 200 MG PO TABS: ORAL_TABLET | ORAL | 0 refills | Status: DC

## 2020-01-21 NOTE — Patient Instructions (Addendum)
Start Amiodarone 200mg  twice a day with food for 1 month then reduce to 1  Tablet a day  Decrease metoprolol to 25mg  once a day  Start lasix 20mg  once a day (1/2 of your 40mg  tab)

## 2020-01-21 NOTE — Progress Notes (Signed)
Primary Care Physician: Binnie Rail, MD Referring Physician: [name], NP   Molly Tucker is a 86 y.o. female with a h/o PAF that usually requires yearly cardioversion's. Her last cardioversion 3/17, was not successful. She is here to discuss antiarrythmic's as she has a tendency to develop diastolic HF in afib.  I saw pt last in 2018. Mostly followed by [name], NP.  Today, she is c/o of LLE x 3 days. She  takes lasix very irregularly. She is fatigued in afib. She cannot come into the hospital for [place] as she cares for her husband with dementia and can not be be away from him for that period of time. He was on amiodarone for some time, until recently it was stopped.   Today, she denies symptoms of palpitations, chest pain, shortness of breath, orthopnea, PND, lower extremity edema, dizziness, presyncope, syncope, or neurologic sequela. The patient is tolerating medications without difficulties and is otherwise without complaint today.   Past Medical History:  Diagnosis Date  . Atrial tachycardia ([place])   . Carotid bruit 2005   right, carotid doppler: <39% occlusion  . Hyperlipidemia    LDL goal =<120  . Hyperplastic colonic polyp 2006   Dr. Carlean Purl, due 2016  . Hypertension   . Osteoporosis    S/P  biphosphonates x 5 yrs  . Persistent atrial fibrillation [facility])    Past Surgical History:  Procedure Laterality Date  . CARDIOVERSION N/A 03/31/2015   Procedure: CARDIOVERSION;  Surgeon: Sanda Klein, MD;  Location: [place];  Service: Cardiovascular;  Laterality: N/A;  . CARDIOVERSION N/A 12/21/2015   Procedure: CARDIOVERSION;  Surgeon: Lelon Perla, MD;  Location: [facility] ENDOSCOPY;  Service: Cardiovascular;  Laterality: N/A;  . CARDIOVERSION N/A 01/09/2017   Procedure: CARDIOVERSION;  Surgeon: Jerline Pain, MD;  Location: [place];  Service: Cardiovascular;  Laterality: N/A;  . CARDIOVERSION N/A 07/09/2019   Procedure: CARDIOVERSION;  Surgeon: Sanda Klein,  MD;  Location: [place] ENDOSCOPY;  Service: Cardiovascular;  Laterality: N/A;  . CARDIOVERSION N/A 01/14/2020   Procedure: CARDIOVERSION;  Surgeon: Geralynn Rile, MD;  Location: [place];  Service: Cardiovascular;  Laterality: N/A;  . COLONOSCOPY W/ POLYPECTOMY  2006  . DILATION AND CURETTAGE OF UTERUS    . MOUTH SURGERY  2009-2010   implants, Dr. Marcelyn Ditty    Current Outpatient Medications  Medication Sig Dispense Refill  . acetaminophen (TYLENOL) 500 MG tablet Take 500 mg by mouth 2 (two) times daily as needed (arthritis pain).    Marland Kitchen ELIQUIS 5 MG TABS tablet TAKE 1 TABLET BY MOUTH  TWICE DAILY (Patient taking differently: Take 5 mg by mouth in the morning and at bedtime. ) 180 tablet 1  . metoprolol succinate (TOPROL-XL) 50 MG 24 hr tablet Take 1 tablet (50 mg total) by mouth daily. TAKE 1 TABLET BY MOUTH  DAILY WITH OR IMMEDIATELY  FOLLOWING A MEAL. 180 tablet 3   No current facility-administered medications for this encounter.    No Known Allergies  Social History   Socioeconomic History  . Marital status: Married    Spouse name: Not on file  . Number of children: Not on file  . Years of education: Not on file  . Highest education level: Not on file  Occupational History  . 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 Smoking status: Former Smoker    Quit date: 10/30/1976    Years since quitting: 43.2  . Smokeless tobacco: Never Used  .  Tobacco comment: Patient would ONLY smoke occasionally   Substance and Sexual Activity  . Alcohol use: No  . Drug use: No  . Sexual activity: Not on file  Other Topics Concern  . Not on file  Social History Narrative   Regular exercise: yes: walking 30 minute once daily   Social Determinants of Health   Financial Resource Strain:   . Difficulty of Paying Living Expenses:   Food Insecurity:   . Worried About Charity fundraiser in the Last Year:   . Arboriculturist in the Last Year:   Transportation Needs:   .  Film/video editor (Medical):   Marland Kitchen Lack of Transportation (Non-Medical):   Physical Activity:   . Days of Exercise per Week:   . Minutes of Exercise per Session:   Stress:   . Feeling of Stress :   Social Connections:   . Frequency of Communication with Friends and Family:   . Frequency of Social Gatherings with Friends and Family:   . Attends Religious Services:   . Active Member of Clubs or Organizations:   . Attends Archivist Meetings:   Marland Kitchen Marital Status:   Intimate Partner Violence:   . Fear of Current or Ex-Partner:   . Emotionally Abused:   Marland Kitchen Physically Abused:   . Sexually Abused:     Family History  Problem Relation Age of Onset  . Coronary artery disease Father        MI in 17s  . Cancer Sister        breast  . Coronary artery disease Sister        stent 2009  . Cancer Paternal Aunt        colon, possible breast cancer  . Arthritis Other        aunts  . Stroke Neg Hx     ROS- All systems are reviewed and negative except as per the HPI above  Physical Exam: Vitals:   01/21/20 1404  BP: (!) 150/76  Pulse: 87  Weight: 64.2 kg  Height: 5\' 4"  (1.626 m)   Wt Readings from Last 3 Encounters:  01/21/20 64.2 kg  01/14/20 62.6 kg  01/07/20 62.6 kg    Labs: Lab Results  Component Value Date   NA 143 01/07/2020   K 5.0 01/07/2020   CL 107 (H) 01/07/2020   CO2 24 01/07/2020   GLUCOSE 89 01/07/2020   BUN 17 01/07/2020   CREATININE 0.83 01/07/2020   CALCIUM 9.3 01/07/2020   MG 2.2 04/06/2017   Lab Results  Component Value Date   INR 1.0 01/07/2020   Lab Results  Component Value Date   CHOL 189 06/24/2019   HDL 54 06/24/2019   LDLCALC 117 (H) 06/24/2019   TRIG 88 06/24/2019     GEN- The patient is well appearing, alert and oriented x 3 today.   Head- normocephalic, atraumatic Eyes-  Sclera clear, conjunctiva pink Ears- hearing intact Oropharynx- clear Neck- supple, no JVP Lymph- no cervical lymphadenopathy Lungs- Clear to  ausculation bilaterally, normal work of breathing Heart- irregular rate and rhythm, no murmurs, rubs or gallops, PMI not laterally displaced GI- soft, NT, ND, + BS Extremities- no clubbing, cyanosis, or edema MS- no significant deformity or atrophy Skin- no rash or lesion Psych- euthymic mood, full affect Neuro- strength and sensation are intact  EKG-afib at 87 bpm, qrs int 74 ms, qrs int 433 ms,  Epic records reviewed    Assessment and Plan: 1.  Persistent EF Failed cardioversion  Options discussed  Would like to avoid flecainide/ multaq with her h/o of HF when in afib  She can not be away from  her husband for admission for Tikosyn or sotalol Therefore, discussed using  amiodarone, risk vrs benefit of drug discussed  Will start at 200 mg bid and plan on cardioversion in one month Decrease metoprolol to 1/2 tab with the additional of amio Cmet/tsh today   2. LLE/CHF Start 1/2 tab of lasix 40 mg daily   3. CHA2DS2VASc score of at least  4 Continue eliquis 5 mg bid   Echo  was scheduled for 3/29 but will cancel for now and reschedule when she resumes SR  Nurse visit in one week for EKG and BP  Will see back in 2 weeks to get set up  for cardioversion  [place]. Maleik Vanderzee, [facility] [address] [place], [place] [address] [phone]

## 2020-01-26 ENCOUNTER — Other Ambulatory Visit (HOSPITAL_COMMUNITY): Payer: Medicare Other

## 2020-01-28 ENCOUNTER — Ambulatory Visit (HOSPITAL_COMMUNITY): Payer: Medicare Other | Admitting: Nurse Practitioner

## 2020-01-29 ENCOUNTER — Ambulatory Visit (HOSPITAL_COMMUNITY)
Admission: RE | Admit: 2020-01-29 | Discharge: 2020-01-29 | Disposition: A | Discharge status: Home / Self Care | Payer: Medicare Other | Source: Ambulatory Visit | Attending: Nurse Practitioner | Admitting: Nurse Practitioner

## 2020-01-29 ENCOUNTER — Other Ambulatory Visit: Payer: Self-pay

## 2020-01-29 DIAGNOSIS — I4891 Unspecified atrial fibrillation: Secondary | ICD-10-CM | POA: Diagnosis not present

## 2020-01-29 LAB — TSH: TSH: 3.827 u[IU]/mL (ref 0.350–4.500)

## 2020-01-29 LAB — COMPREHENSIVE METABOLIC PANEL
ALT: 21 U/L (ref 0–44)
AST: 25 U/L (ref 15–41)
Albumin: 3.7 g/dL (ref 3.5–5.0)
Alkaline Phosphatase: 37 U/L — ABNORMAL LOW (ref 38–126)
Anion gap: 6 (ref 5–15)
BUN: 14 mg/dL (ref 8–23)
CO2: 29 mmol/L (ref 22–32)
Calcium: 9.2 mg/dL (ref 8.9–10.3)
Chloride: 106 mmol/L (ref 98–111)
Creatinine, Ser: 0.89 mg/dL (ref 0.44–1.00)
GFR calc Af Amer: >60 mL/min (ref >60)
GFR calc non Af Amer: 59 mL/min — ABNORMAL LOW (ref >60)
Glucose, Bld: 94 mg/dL (ref 70–99)
Potassium: 4.4 mmol/L (ref 3.5–5.1)
Sodium: 141 mmol/L (ref 135–145)
Total Bilirubin: 1.3 mg/dL — ABNORMAL HIGH (ref 0.3–1.2)
Total Protein: 6.2 g/dL — ABNORMAL LOW (ref 6.5–8.1)

## 2020-01-29 MED ORDER — FUROSEMIDE 40 MG PO TABS: 20.0000 mg | ORAL_TABLET | Freq: Every day | ORAL | Status: DC | PRN

## 2020-01-29 NOTE — Progress Notes (Signed)
Pt in for a ekg after starting amiodarone 200 mg bid . She  is tolerating  well and ekg shows afib in the 80's , qtc acceptable at 484 ms. I also started her on  lasix on last visit for daily use, but she feels her fluid status is stable and wants to use it as needed. Baseline labs for amiodarone start today , cmet/tsh. I will see back in 2 weeks.

## 2020-01-29 NOTE — Patient Instructions (Signed)
Use lasix 20mg  once a day as needed for swelling/weight gain

## 2020-02-04 ENCOUNTER — Ambulatory Visit (HOSPITAL_COMMUNITY): Payer: Medicare Other | Admitting: Nurse Practitioner

## 2020-02-11 ENCOUNTER — Other Ambulatory Visit: Payer: Self-pay

## 2020-02-11 ENCOUNTER — Encounter (HOSPITAL_COMMUNITY): Payer: Self-pay | Admitting: Nurse Practitioner

## 2020-02-11 ENCOUNTER — Ambulatory Visit (HOSPITAL_COMMUNITY)
Admission: RE | Admit: 2020-02-11 | Discharge: 2020-02-11 | Disposition: A | Discharge status: Home / Self Care | Payer: Medicare Other | Source: Ambulatory Visit | Attending: Nurse Practitioner | Admitting: Nurse Practitioner

## 2020-02-11 VITALS — BP 146/76 | HR 77 | Ht 64.0 in | Wt 144.2 lb

## 2020-02-11 DIAGNOSIS — I480 Paroxysmal atrial fibrillation: Secondary | ICD-10-CM

## 2020-02-11 DIAGNOSIS — Z8249 Family history of ischemic heart disease and other diseases of the circulatory system: Secondary | ICD-10-CM | POA: Insufficient documentation

## 2020-02-11 DIAGNOSIS — I509 Heart failure, unspecified: Secondary | ICD-10-CM | POA: Diagnosis not present

## 2020-02-11 DIAGNOSIS — I4819 Other persistent atrial fibrillation: Secondary | ICD-10-CM | POA: Diagnosis not present

## 2020-02-11 DIAGNOSIS — Z7901 Long term (current) use of anticoagulants: Secondary | ICD-10-CM | POA: Diagnosis not present

## 2020-02-11 DIAGNOSIS — R600 Localized edema: Secondary | ICD-10-CM | POA: Insufficient documentation

## 2020-02-11 DIAGNOSIS — Z79899 Other long term (current) drug therapy: Secondary | ICD-10-CM | POA: Insufficient documentation

## 2020-02-11 DIAGNOSIS — I110 Hypertensive heart disease with heart failure: Secondary | ICD-10-CM | POA: Diagnosis not present

## 2020-02-11 DIAGNOSIS — Z87891 Personal history of nicotine dependence: Secondary | ICD-10-CM | POA: Insufficient documentation

## 2020-02-11 DIAGNOSIS — D6869 Other thrombophilia: Secondary | ICD-10-CM

## 2020-02-11 DIAGNOSIS — E785 Hyperlipidemia, unspecified: Secondary | ICD-10-CM | POA: Insufficient documentation

## 2020-02-11 LAB — BASIC METABOLIC PANEL
Anion gap: 7 (ref 5–15)
BUN: 20 mg/dL (ref 8–23)
CO2: 27 mmol/L (ref 22–32)
Calcium: 8.9 mg/dL (ref 8.9–10.3)
Chloride: 106 mmol/L (ref 98–111)
Creatinine, Ser: 0.98 mg/dL (ref 0.44–1.00)
GFR calc Af Amer: >60 mL/min (ref >60)
GFR calc non Af Amer: 52 mL/min — ABNORMAL LOW (ref >60)
Glucose, Bld: 100 mg/dL — ABNORMAL HIGH (ref 70–99)
Potassium: 5 mmol/L (ref 3.5–5.1)
Sodium: 140 mmol/L (ref 135–145)

## 2020-02-11 LAB — CBC
HCT: 43.5 % (ref 36.0–46.0)
Hemoglobin: 13.7 g/dL (ref 12.0–15.0)
MCH: 30.6 pg (ref 26.0–34.0)
MCHC: 31.5 g/dL (ref 30.0–36.0)
MCV: 97.3 fL (ref 80.0–100.0)
Platelets: 200 10*3/uL (ref 150–400)
RBC: 4.47 MIL/uL (ref 3.87–5.11)
RDW: 14.3 % (ref 11.5–15.5)
WBC: 5 10*3/uL (ref 4.0–10.5)
nRBC: 0 % (ref 0.0–0.2)

## 2020-02-11 MED ORDER — AMIODARONE HCL 200 MG PO TABS: 200.0000 mg | ORAL_TABLET | Freq: Every day | ORAL | 3 refills | Status: DC

## 2020-02-11 NOTE — Progress Notes (Signed)
Primary Care Physician: Binnie Rail, MD Referring Physician: [name], NP   Molly Tucker is a 86 y.o. female with a h/o PAF that usually requires yearly cardioversion's. Her last cardioversion 3/17, was not successful. She is here to discuss antiarrythmic's as she has a tendency to develop diastolic HF in afib.  I saw pt last in 2018. Mostly followed by [name], NP.  Today, she is c/o of LLE x 3 days. She  takes lasix very irregularly. She is fatigued in afib. She cannot come into the hospital for [place] as she cares for her husband with dementia and can not be be away from him for that period of time. He was on amiodarone for some time until recently,  it has been  stopped.   F/u 4/14. She has been loading on amiodarone 200 mg bid for 3 weeks. She is  c/o of slight nausea. She remains in rate controlled afib. WIll plan on  cardioversion after another week. Will go ahead and reduce amio to daily hopefully helping the nausea.. She is taking with food. No missed anticoagulation.  Today, she denies symptoms of palpitations, chest pain, shortness of breath, orthopnea, PND, lower extremity edema, dizziness, presyncope, syncope, or neurologic sequela. The patient is tolerating medications without difficulties and is otherwise without complaint today.   Past Medical History:  Diagnosis Date  . Atrial tachycardia ([place])   . Carotid bruit 2005   right, carotid doppler: <39% occlusion  . Hyperlipidemia    LDL goal =<120  . Hyperplastic colonic polyp 2006   Dr. Carlean Purl, due 2016  . Hypertension   . Osteoporosis    S/P  biphosphonates x 5 yrs  . Persistent atrial fibrillation [facility])    Past Surgical History:  Procedure Laterality Date  . CARDIOVERSION N/A 03/31/2015   Procedure: CARDIOVERSION;  Surgeon: Sanda Klein, MD;  Location: [place];  Service: Cardiovascular;  Laterality: N/A;  . CARDIOVERSION N/A 12/21/2015   Procedure: CARDIOVERSION;  Surgeon: Lelon Perla, MD;   Location: [facility] ENDOSCOPY;  Service: Cardiovascular;  Laterality: N/A;  . CARDIOVERSION N/A 01/09/2017   Procedure: CARDIOVERSION;  Surgeon: Jerline Pain, MD;  Location: [place];  Service: Cardiovascular;  Laterality: N/A;  . CARDIOVERSION N/A 07/09/2019   Procedure: CARDIOVERSION;  Surgeon: Sanda Klein, MD;  Location: [place] ENDOSCOPY;  Service: Cardiovascular;  Laterality: N/A;  . CARDIOVERSION N/A 01/14/2020   Procedure: CARDIOVERSION;  Surgeon: Geralynn Rile, MD;  Location: [place];  Service: Cardiovascular;  Laterality: N/A;  . COLONOSCOPY W/ POLYPECTOMY  2006  . DILATION AND CURETTAGE OF UTERUS    . MOUTH SURGERY  2009-2010   implants, Dr. Marcelyn Ditty    Current Outpatient Medications  Medication Sig Dispense Refill  . acetaminophen (TYLENOL) 500 MG tablet Take 500 mg by mouth as needed (arthritis pain).     Marland Kitchen amiodarone (PACERONE) 200 MG tablet Take 1 tablet (200 mg total) by mouth daily. 30 tablet 3  . ELIQUIS 5 MG TABS tablet TAKE 1 TABLET BY MOUTH  TWICE DAILY (Patient taking differently: Take 5 mg by mouth in the morning and at bedtime. ) 180 tablet 1  . furosemide (LASIX) 40 MG tablet Take 0.5 tablets (20 mg total) by mouth daily as needed for fluid. 30 tablet   . metoprolol succinate (TOPROL-XL) 25 MG 24 hr tablet Take 1 tablet (25 mg total) by mouth daily. 30 tablet 3   No current facility-administered medications for this encounter.    No Known Allergies  Social History   Socioeconomic History  . Marital status: Married    Spouse name: Not on file  . Number of children: Not on file  . Years of education: Not on file  . Highest education level: Not on file  Occupational History  . 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 Smoking status: Former Smoker    Quit date: 10/30/1976    Years since quitting: 43.3  . Smokeless tobacco: Never Used  . Tobacco comment: Patient would ONLY smoke occasionally   Substance and Sexual Activity  .  Alcohol use: No  . Drug use: No  . Sexual activity: Not on file  Other Topics Concern  . Not on file  Social History Narrative   Regular exercise: yes: walking 30 minute once daily   Social Determinants of Health   Financial Resource Strain:   . Difficulty of Paying Living Expenses:   Food Insecurity:   . Worried About Charity fundraiser in the Last Year:   . Arboriculturist in the Last Year:   Transportation Needs:   . Film/video editor (Medical):   Marland Kitchen Lack of Transportation (Non-Medical):   Physical Activity:   . Days of Exercise per Week:   . Minutes of Exercise per Session:   Stress:   . Feeling of Stress :   Social Connections:   . Frequency of Communication with Friends and Family:   . Frequency of Social Gatherings with Friends and Family:   . Attends Religious Services:   . Active Member of Clubs or Organizations:   . Attends Archivist Meetings:   Marland Kitchen Marital Status:   Intimate Partner Violence:   . Fear of Current or Ex-Partner:   . Emotionally Abused:   Marland Kitchen Physically Abused:   . Sexually Abused:     Family History  Problem Relation Age of Onset  . Coronary artery disease Father        MI in 13s  . Cancer Sister        breast  . Coronary artery disease Sister        stent 2009  . Cancer Paternal Aunt        colon, possible breast cancer  . Arthritis Other        aunts  . Stroke Neg Hx     ROS- All systems are reviewed and negative except as per the HPI above  Physical Exam: Vitals:   02/11/20 1518  BP: (!) 146/76  Pulse: 77  Weight: 65.4 kg  Height: 5\' 4"  (1.626 m)   Wt Readings from Last 3 Encounters:  02/11/20 65.4 kg  01/21/20 64.2 kg  01/14/20 62.6 kg    Labs: Lab Results  Component Value Date   NA 141 01/29/2020   K 4.4 01/29/2020   CL 106 01/29/2020   CO2 29 01/29/2020   GLUCOSE 94 01/29/2020   BUN 14 01/29/2020   CREATININE 0.89 01/29/2020   CALCIUM 9.2 01/29/2020   MG 2.2 04/06/2017   Lab Results    Component Value Date   INR 1.0 01/07/2020   Lab Results  Component Value Date   CHOL 189 06/24/2019   HDL 54 06/24/2019   LDLCALC 117 (H) 06/24/2019   TRIG 88 06/24/2019     GEN- The patient is well appearing, alert and oriented x 3 today.   Head- normocephalic, atraumatic Eyes-  Sclera clear, conjunctiva pink Ears- hearing intact Oropharynx- clear Neck- supple, no JVP  Lymph- no cervical lymphadenopathy Lungs- Clear to ausculation bilaterally, normal work of breathing Heart- irregular rate and rhythm, no murmurs, rubs or gallops, PMI not laterally displaced GI- soft, NT, ND, + BS Extremities- no clubbing, cyanosis, or edema MS- no significant deformity or atrophy Skin- no rash or lesion Psych- euthymic mood, full affect Neuro- strength and sensation are intact  EKG-afib at 77  bpm, qrs int 76 ms, qrs int 457 ms,  Epic records reviewed    Assessment and Plan: 1. Persistent afib  Failed cardioversion  Options discussed  She wanted to avoid flecainide/ multaq with her h/o of HF when in afib  She can not be away from  her husband for admission for Tikosyn or sotalol as he has dementia Now  that pt is approaching being on drug x one month, will plan to cardiovert in the next 7-10 days Continue  metoprolol at 25 mg qd Reduce amiodarone to 200 mg daily for nausea complaints  Cardioversion scheduled  Cbc/bemt today  Will need echo in [place]   2. LLE/CHF Lasix 20 mg as needed for fluid Daily weights  3. CHA2DS2VASc score of at least  4 Continue eliquis 5 mg bid, states no missed doses x 3 weeks   F/u one week after cardioversion   Butch Penny C. Ashantia Amaral, [facility] [address] [place], [place] [address] [phone]

## 2020-02-11 NOTE — Patient Instructions (Signed)
Cardioversion scheduled for Monday, April 26th  - Arrive at the [facility] and go to admitting at [facility] not eat or drink anything after midnight the night prior to your procedure.  - Take all your morning medication with a sip of water prior to arrival.  - You will not be able to drive home after your procedure.   Decrease Amiodarone 200mg  once a day with food.

## 2020-02-11 NOTE — H&P (View-Only) (Signed)
Primary Care Physician: Binnie Rail, MD Referring Physician: [name], NP   SHYNA GILLOGLY is a 86 y.o. female with a h/o PAF that usually requires yearly cardioversion's. Her last cardioversion 3/17, was not successful. She is here to discuss antiarrythmic's as she has a tendency to develop diastolic HF in afib.  I saw pt last in 2018. Mostly followed by [name], NP.  Today, she is c/o of LLE x 3 days. She  takes lasix very irregularly. She is fatigued in afib. She cannot come into the hospital for [place] as she cares for her husband with dementia and can not be be away from him for that period of time. He was on amiodarone for some time until recently,  it has been  stopped.   F/u 4/14. She has been loading on amiodarone 200 mg bid for 3 weeks. She is  c/o of slight nausea. She remains in rate controlled afib. WIll plan on  cardioversion after another week. Will go ahead and reduce amio to daily hopefully helping the nausea.. She is taking with food. No missed anticoagulation.  Today, she denies symptoms of palpitations, chest pain, shortness of breath, orthopnea, PND, lower extremity edema, dizziness, presyncope, syncope, or neurologic sequela. The patient is tolerating medications without difficulties and is otherwise without complaint today.   Past Medical History:  Diagnosis Date  . Atrial tachycardia ([place])   . Carotid bruit 2005   right, carotid doppler: <39% occlusion  . Hyperlipidemia    LDL goal =<120  . Hyperplastic colonic polyp 2006   Dr. Carlean Purl, due 2016  . Hypertension   . Osteoporosis    S/P  biphosphonates x 5 yrs  . Persistent atrial fibrillation [facility])    Past Surgical History:  Procedure Laterality Date  . CARDIOVERSION N/A 03/31/2015   Procedure: CARDIOVERSION;  Surgeon: Sanda Klein, MD;  Location: [place];  Service: Cardiovascular;  Laterality: N/A;  . CARDIOVERSION N/A 12/21/2015   Procedure: CARDIOVERSION;  Surgeon: Lelon Perla, MD;   Location: [facility] ENDOSCOPY;  Service: Cardiovascular;  Laterality: N/A;  . CARDIOVERSION N/A 01/09/2017   Procedure: CARDIOVERSION;  Surgeon: Jerline Pain, MD;  Location: [place];  Service: Cardiovascular;  Laterality: N/A;  . CARDIOVERSION N/A 07/09/2019   Procedure: CARDIOVERSION;  Surgeon: Sanda Klein, MD;  Location: [place] ENDOSCOPY;  Service: Cardiovascular;  Laterality: N/A;  . CARDIOVERSION N/A 01/14/2020   Procedure: CARDIOVERSION;  Surgeon: Geralynn Rile, MD;  Location: [place];  Service: Cardiovascular;  Laterality: N/A;  . COLONOSCOPY W/ POLYPECTOMY  2006  . DILATION AND CURETTAGE OF UTERUS    . MOUTH SURGERY  2009-2010   implants, Dr. Marcelyn Ditty    Current Outpatient Medications  Medication Sig Dispense Refill  . acetaminophen (TYLENOL) 500 MG tablet Take 500 mg by mouth as needed (arthritis pain).     Marland Kitchen amiodarone (PACERONE) 200 MG tablet Take 1 tablet (200 mg total) by mouth daily. 30 tablet 3  . ELIQUIS 5 MG TABS tablet TAKE 1 TABLET BY MOUTH  TWICE DAILY (Patient taking differently: Take 5 mg by mouth in the morning and at bedtime. ) 180 tablet 1  . furosemide (LASIX) 40 MG tablet Take 0.5 tablets (20 mg total) by mouth daily as needed for fluid. 30 tablet   . metoprolol succinate (TOPROL-XL) 25 MG 24 hr tablet Take 1 tablet (25 mg total) by mouth daily. 30 tablet 3   No current facility-administered medications for this encounter.    No Known Allergies  Social History   Socioeconomic History  . Marital status: Married    Spouse name: Not on file  . Number of children: Not on file  . Years of education: Not on file  . Highest education level: Not on file  Occupational History  . 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 Smoking status: Former Smoker    Quit date: 10/30/1976    Years since quitting: 43.3  . Smokeless tobacco: Never Used  . Tobacco comment: Patient would ONLY smoke occasionally   Substance and Sexual Activity  .  Alcohol use: No  . Drug use: No  . Sexual activity: Not on file  Other Topics Concern  . Not on file  Social History Narrative   Regular exercise: yes: walking 30 minute once daily   Social Determinants of Health   Financial Resource Strain:   . Difficulty of Paying Living Expenses:   Food Insecurity:   . Worried About Charity fundraiser in the Last Year:   . Arboriculturist in the Last Year:   Transportation Needs:   . Film/video editor (Medical):   Marland Kitchen Lack of Transportation (Non-Medical):   Physical Activity:   . Days of Exercise per Week:   . Minutes of Exercise per Session:   Stress:   . Feeling of Stress :   Social Connections:   . Frequency of Communication with Friends and Family:   . Frequency of Social Gatherings with Friends and Family:   . Attends Religious Services:   . Active Member of Clubs or Organizations:   . Attends Archivist Meetings:   Marland Kitchen Marital Status:   Intimate Partner Violence:   . Fear of Current or Ex-Partner:   . Emotionally Abused:   Marland Kitchen Physically Abused:   . Sexually Abused:     Family History  Problem Relation Age of Onset  . Coronary artery disease Father        MI in 88s  . Cancer Sister        breast  . Coronary artery disease Sister        stent 2009  . Cancer Paternal Aunt        colon, possible breast cancer  . Arthritis Other        aunts  . Stroke Neg Hx     ROS- All systems are reviewed and negative except as per the HPI above  Physical Exam: Vitals:   02/11/20 1518  BP: (!) 146/76  Pulse: 77  Weight: 65.4 kg  Height: 5\' 4"  (1.626 m)   Wt Readings from Last 3 Encounters:  02/11/20 65.4 kg  01/21/20 64.2 kg  01/14/20 62.6 kg    Labs: Lab Results  Component Value Date   NA 141 01/29/2020   K 4.4 01/29/2020   CL 106 01/29/2020   CO2 29 01/29/2020   GLUCOSE 94 01/29/2020   BUN 14 01/29/2020   CREATININE 0.89 01/29/2020   CALCIUM 9.2 01/29/2020   MG 2.2 04/06/2017   Lab Results    Component Value Date   INR 1.0 01/07/2020   Lab Results  Component Value Date   CHOL 189 06/24/2019   HDL 54 06/24/2019   LDLCALC 117 (H) 06/24/2019   TRIG 88 06/24/2019     GEN- The patient is well appearing, alert and oriented x 3 today.   Head- normocephalic, atraumatic Eyes-  Sclera clear, conjunctiva pink Ears- hearing intact Oropharynx- clear Neck- supple, no JVP  Lymph- no cervical lymphadenopathy Lungs- Clear to ausculation bilaterally, normal work of breathing Heart- irregular rate and rhythm, no murmurs, rubs or gallops, PMI not laterally displaced GI- soft, NT, ND, + BS Extremities- no clubbing, cyanosis, or edema MS- no significant deformity or atrophy Skin- no rash or lesion Psych- euthymic mood, full affect Neuro- strength and sensation are intact  EKG-afib at 77  bpm, qrs int 76 ms, qrs int 457 ms,  Epic records reviewed    Assessment and Plan: 1. Persistent afib  Failed cardioversion  Options discussed  She wanted to avoid flecainide/ multaq with her h/o of HF when in afib  She can not be away from  her husband for admission for Tikosyn or sotalol as he has dementia Now  that pt is approaching being on drug x one month, will plan to cardiovert in the next 7-10 days Continue  metoprolol at 25 mg qd Reduce amiodarone to 200 mg daily for nausea complaints  Cardioversion scheduled  Cbc/bemt today  Will need echo in [place]   2. LLE/CHF Lasix 20 mg as needed for fluid Daily weights  3. CHA2DS2VASc score of at least  4 Continue eliquis 5 mg bid, states no missed doses x 3 weeks   F/u one week after cardioversion   Butch Penny C. Ariez Neilan, [facility] [address] [place], [place] [address] [phone]

## 2020-02-20 ENCOUNTER — Other Ambulatory Visit (HOSPITAL_COMMUNITY)
Admission: RE | Admit: 2020-02-20 | Discharge: 2020-02-20 | Disposition: A | Discharge status: Home / Self Care | Payer: Medicare Other | Source: Ambulatory Visit | Attending: Cardiology | Admitting: Cardiology

## 2020-02-20 DIAGNOSIS — Z20822 Contact with and (suspected) exposure to covid-19: Secondary | ICD-10-CM | POA: Diagnosis not present

## 2020-02-20 DIAGNOSIS — Z01812 Encounter for preprocedural laboratory examination: Secondary | ICD-10-CM | POA: Insufficient documentation

## 2020-02-20 LAB — SARS CORONAVIRUS 2 (TAT 6-24 HRS): SARS Coronavirus 2: NEGATIVE

## 2020-02-23 ENCOUNTER — Ambulatory Visit (HOSPITAL_COMMUNITY): Payer: Medicare Other | Admitting: Certified Registered Nurse Anesthetist

## 2020-02-23 ENCOUNTER — Encounter (HOSPITAL_COMMUNITY)
Admission: RE | Disposition: A | Discharge status: Home / Self Care | Payer: Medicare Other | Source: Home / Self Care | Attending: Cardiology

## 2020-02-23 ENCOUNTER — Encounter (HOSPITAL_COMMUNITY): Payer: Self-pay | Admitting: Cardiology

## 2020-02-23 ENCOUNTER — Ambulatory Visit (HOSPITAL_COMMUNITY)
Admission: RE | Admit: 2020-02-23 | Discharge: 2020-02-23 | Disposition: A | Discharge status: Home / Self Care | Payer: Medicare Other | Attending: Cardiology | Admitting: Cardiology

## 2020-02-23 DIAGNOSIS — Z87891 Personal history of nicotine dependence: Secondary | ICD-10-CM | POA: Diagnosis not present

## 2020-02-23 DIAGNOSIS — M810 Age-related osteoporosis without current pathological fracture: Secondary | ICD-10-CM | POA: Diagnosis not present

## 2020-02-23 DIAGNOSIS — I4819 Other persistent atrial fibrillation: Secondary | ICD-10-CM | POA: Insufficient documentation

## 2020-02-23 DIAGNOSIS — E785 Hyperlipidemia, unspecified: Secondary | ICD-10-CM | POA: Diagnosis not present

## 2020-02-23 DIAGNOSIS — I4892 Unspecified atrial flutter: Secondary | ICD-10-CM | POA: Diagnosis not present

## 2020-02-23 DIAGNOSIS — Z79899 Other long term (current) drug therapy: Secondary | ICD-10-CM | POA: Diagnosis not present

## 2020-02-23 DIAGNOSIS — Z7901 Long term (current) use of anticoagulants: Secondary | ICD-10-CM | POA: Diagnosis not present

## 2020-02-23 DIAGNOSIS — I480 Paroxysmal atrial fibrillation: Secondary | ICD-10-CM

## 2020-02-23 DIAGNOSIS — Z8249 Family history of ischemic heart disease and other diseases of the circulatory system: Secondary | ICD-10-CM | POA: Insufficient documentation

## 2020-02-23 DIAGNOSIS — I110 Hypertensive heart disease with heart failure: Secondary | ICD-10-CM | POA: Diagnosis not present

## 2020-02-23 DIAGNOSIS — I10 Essential (primary) hypertension: Secondary | ICD-10-CM | POA: Diagnosis not present

## 2020-02-23 DIAGNOSIS — I509 Heart failure, unspecified: Secondary | ICD-10-CM | POA: Diagnosis not present

## 2020-02-23 HISTORY — PX: CARDIOVERSION: SHX1299

## 2020-02-23 SURGERY — CARDIOVERSION
Anesthesia: General

## 2020-02-23 MED ORDER — PROPOFOL 10 MG/ML IV BOLUS
INTRAVENOUS | Status: DC | PRN
  Administered 2020-02-23: 30 mg via INTRAVENOUS
  Administered 2020-02-23: 50 mg via INTRAVENOUS
  Administered 2020-02-23: 30 mg via INTRAVENOUS
  Administered 2020-02-23: 40 mg via INTRAVENOUS
  Administered 2020-02-23: 50 mg via INTRAVENOUS
  Administered 2020-02-23: 40 mg via INTRAVENOUS
  Administered 2020-02-23: 20 mg via INTRAVENOUS

## 2020-02-23 MED ORDER — LIDOCAINE 2% (20 MG/ML) 5 ML SYRINGE
INTRAMUSCULAR | Status: DC | PRN
  Administered 2020-02-23: 80 mg via INTRAVENOUS

## 2020-02-23 MED ORDER — SODIUM CHLORIDE 0.9 % IV SOLN: INTRAVENOUS | Status: DC | PRN

## 2020-02-23 NOTE — Anesthesia Preprocedure Evaluation (Signed)
Anesthesia Evaluation  Patient identified by MRN, date of birth, ID band Patient awake    Reviewed: Allergy & Precautions, NPO status , Patient's Chart, lab work & pertinent test results  History of Anesthesia Complications Negative for: history of anesthetic complications  Airway Mallampati: II  TM Distance: >3 FB Neck ROM: Full    Dental  (+) Dental Advisory Given, Teeth Intact   Pulmonary former smoker,    breath sounds clear to auscultation       Cardiovascular hypertension, Pt. on home beta blockers and Pt. on medications + dysrhythmias Atrial Fibrillation + Valvular Problems/Murmurs  Rhythm:Irregular Rate:Tachycardia   '16 TTE - EF 55% to 60%. RV size was mildly dilated. RA was mildly dilated. Moderate TR.    Neuro/Psych negative neurological ROS  negative psych ROS   GI/Hepatic negative GI ROS, Neg liver ROS,   Endo/Other  negative endocrine ROS  Renal/GU negative Renal ROS     Musculoskeletal  (+) Arthritis ,   Abdominal   Peds  Hematology  On eliquis    Anesthesia Other Findings Covid neg 01/12/20   Reproductive/Obstetrics                             Anesthesia Physical Anesthesia Plan  ASA: III  Anesthesia Plan: General   Post-op Pain Management:    Induction: Intravenous  PONV Risk Score and Plan: 3 and Treatment may vary due to age or medical condition  Airway Management Planned: Mask  Additional Equipment: None  Intra-op Plan:   Post-operative Plan:   Informed Consent: I have reviewed the patients History and Physical, chart, labs and discussed the procedure including the risks, benefits and alternatives for the proposed anesthesia with the patient or authorized representative who has indicated his/her understanding and acceptance.     Dental advisory given  Plan Discussed with: CRNA and Surgeon  Anesthesia Plan Comments:         Anesthesia Quick  Evaluation

## 2020-02-23 NOTE — Anesthesia Postprocedure Evaluation (Signed)
Anesthesia Post Note  Patient: AMARE DINALLO  Procedure(s) Performed: CARDIOVERSION (N/A )     Patient location during evaluation: Endoscopy Anesthesia Type: General Level of consciousness: awake and alert Pain management: pain level controlled Vital Signs Assessment: post-procedure vital signs reviewed and stable Respiratory status: spontaneous breathing, nonlabored ventilation, respiratory function stable and patient connected to nasal cannula oxygen Cardiovascular status: blood pressure returned to baseline and stable Postop Assessment: no apparent nausea or vomiting Anesthetic complications: no    Last Vitals:  Vitals:   02/23/20 1132 02/23/20 1149  BP: (!) 107/57 120/70  Pulse:  60  Resp: 17 15  Temp:    SpO2:  95%    Last Pain:  Vitals:   02/23/20 1149  TempSrc:   PainSc: 0-No pain                 Ernie Sagrero

## 2020-02-23 NOTE — CV Procedure (Signed)
    Electrical Cardioversion Procedure Note [name] [id number] Jun 10, 1932  Procedure: Electrical Cardioversion Indications:  Atrial Fibrillation  Time Out: Verified patient identification, verified procedure,medications/allergies/relevent history reviewed, required imaging and test results available.  Performed  Procedure Details  The patient was NPO after midnight. Anesthesia was administered at the beside  by Dr. Ermalene Postin with 60mg  of propofol.  Cardioversion was performed with synchronized biphasic defibrillation via AP pads with 120, 150, 200 joules.  3 attempt(s) were performed.  The patient converted to normal sinus rhythm for about 30 seconds after the second shock then reverted back to atrial flutter with variable conduction. Another attempt at 200J was made but unsuccessful. The patient tolerated the procedure well   IMPRESSION:  Unsuccessful cardioversion of atrial fibrillation. Currently in slow atrial flutter with variable conduction HR 80-90.    Molly Tucker 02/23/2020, 11:27 AM

## 2020-02-23 NOTE — Progress Notes (Signed)
   Interestingly, she converted to sinus bradycardia about 20 minutes after completion of cardioversion. ECG reflects this. Spoke to patient.   [name], MD

## 2020-02-23 NOTE — Interval H&P Note (Signed)
History and Physical Interval Note:  02/23/2020 10:42 AM  Molly Tucker  has presented today for surgery, with the diagnosis of A-FIB.  The various methods of treatment have been discussed with the patient and family. After consideration of risks, benefits and other options for treatment, the patient has consented to  Procedure(s): CARDIOVERSION (N/A) as a surgical intervention.  The patient's history has been reviewed, patient examined, no change in status, stable for surgery.  I have reviewed the patient's chart and labs.  Questions were answered to the patient's satisfaction.     [employer]

## 2020-02-23 NOTE — Anesthesia Procedure Notes (Signed)
Procedure Name: General with mask airway Performed by: Milford Cage, CRNA Pre-anesthesia Checklist: Timeout performed, Patient being monitored, Suction available, Emergency Drugs available and Patient identified Patient Re-evaluated:Patient Re-evaluated prior to induction Oxygen Delivery Method: Ambu bag Preoxygenation: Pre-oxygenation with 100% oxygen Induction Type: IV induction

## 2020-02-23 NOTE — Transfer of Care (Signed)
Immediate Anesthesia Transfer of Care Note  Patient: Molly Tucker  Procedure(s) Performed: CARDIOVERSION (N/A )  Patient Location: Endoscopy Unit  Anesthesia Type:General  Level of Consciousness: awake  Airway & Oxygen Therapy: Patient Spontanous Breathing  Post-op Assessment: Report given to RN and Post -op Vital signs reviewed and stable  Post vital signs: Reviewed and stable  Last Vitals:  Vitals Value Taken Time  BP    Temp    Pulse    Resp    SpO2      Last Pain:  Vitals:   02/23/20 1029  TempSrc: Oral  PainSc: 0-No pain         Complications: No apparent anesthesia complications

## 2020-02-24 LAB — POCT I-STAT, CHEM 8
BUN: 17 mg/dL (ref 8–23)
Calcium, Ion: 1.19 mmol/L (ref 1.15–1.40)
Chloride: 104 mmol/L (ref 98–111)
Creatinine, Ser: 0.9 mg/dL (ref 0.44–1.00)
Glucose, Bld: 91 mg/dL (ref 70–99)
HCT: 46 % (ref 36.0–46.0)
Hemoglobin: 15.6 g/dL — ABNORMAL HIGH (ref 12.0–15.0)
Potassium: 4.2 mmol/L (ref 3.5–5.1)
Sodium: 140 mmol/L (ref 135–145)
TCO2: 26 mmol/L (ref 22–32)

## 2020-03-01 ENCOUNTER — Encounter (HOSPITAL_COMMUNITY): Payer: Self-pay | Admitting: Nurse Practitioner

## 2020-03-01 ENCOUNTER — Other Ambulatory Visit: Payer: Self-pay

## 2020-03-01 ENCOUNTER — Ambulatory Visit (HOSPITAL_COMMUNITY)
Admission: RE | Admit: 2020-03-01 | Discharge: 2020-03-01 | Disposition: A | Discharge status: Home / Self Care | Payer: Medicare Other | Source: Ambulatory Visit | Attending: Nurse Practitioner | Admitting: Nurse Practitioner

## 2020-03-01 VITALS — BP 130/70 | HR 59 | Ht 64.0 in | Wt 145.6 lb

## 2020-03-01 DIAGNOSIS — Z803 Family history of malignant neoplasm of breast: Secondary | ICD-10-CM | POA: Insufficient documentation

## 2020-03-01 DIAGNOSIS — I4819 Other persistent atrial fibrillation: Secondary | ICD-10-CM | POA: Diagnosis not present

## 2020-03-01 DIAGNOSIS — Z86010 Personal history of colonic polyps: Secondary | ICD-10-CM | POA: Insufficient documentation

## 2020-03-01 DIAGNOSIS — Z8249 Family history of ischemic heart disease and other diseases of the circulatory system: Secondary | ICD-10-CM | POA: Insufficient documentation

## 2020-03-01 DIAGNOSIS — Z7901 Long term (current) use of anticoagulants: Secondary | ICD-10-CM | POA: Insufficient documentation

## 2020-03-01 DIAGNOSIS — I509 Heart failure, unspecified: Secondary | ICD-10-CM | POA: Insufficient documentation

## 2020-03-01 DIAGNOSIS — Z79899 Other long term (current) drug therapy: Secondary | ICD-10-CM | POA: Diagnosis not present

## 2020-03-01 DIAGNOSIS — Z87891 Personal history of nicotine dependence: Secondary | ICD-10-CM | POA: Diagnosis not present

## 2020-03-01 DIAGNOSIS — I4891 Unspecified atrial fibrillation: Secondary | ICD-10-CM | POA: Diagnosis present

## 2020-03-01 DIAGNOSIS — D6869 Other thrombophilia: Secondary | ICD-10-CM | POA: Diagnosis not present

## 2020-03-01 MED ORDER — AMIODARONE HCL 200 MG PO TABS
100.0000 mg | ORAL_TABLET | Freq: Every day | ORAL | 3 refills | Status: DC
Start: 2020-03-01 — End: 2020-05-25

## 2020-03-01 NOTE — Progress Notes (Signed)
Primary Care Physician: Binnie Rail, MD Referring Physician: [name], NP   Molly Tucker is a 86 y.o. female with a h/o PAF that usually requires yearly cardioversion's. Her last cardioversion 3/17, was not successful. She is here to discuss antiarrythmic's as she has a tendency to develop diastolic HF in afib.  I saw pt last in 2018. Mostly followed by [name], NP.  Today, she is c/o of LLE x 3 days. She  takes lasix very irregularly. She is fatigued in afib. She cannot come into the hospital for [place] as she cares for her husband with dementia and can not be be away from him for that period of time. He was on amiodarone for some time until recently,  it has been  stopped.   F/u 4/14. She has been loading on amiodarone 200 mg bid for 3 weeks. She is  c/o of slight nausea. She remains in rate controlled afib. WIll plan on  cardioversion after another week. Will go ahead and reduce amio to daily hopefully helping the nausea.. She is taking with food. No missed anticoagulation.  F/u s/p DCCV, 03/01/20. Initially she was shocked x 3 and remained in afib, 20 mins later she went into SR and remains in SR today. Although  she is in SR, she does not feel as well. Continues with some mild nausea and weakness. Discussed  decreasing amiodarone to 100 mg daily.   Today, she denies symptoms of palpitations, chest pain, shortness of breath, orthopnea, PND, lower extremity edema, dizziness, presyncope, syncope, or neurologic sequela. The patient is tolerating medications without difficulties and is otherwise without complaint today.   Past Medical History:  Diagnosis Date  . Atrial tachycardia ([place])   . Carotid bruit 2005   right, carotid doppler: <39% occlusion  . Hyperlipidemia    LDL goal =<120  . Hyperplastic colonic polyp 2006   Dr. Carlean Purl, due 2016  . Hypertension   . Osteoporosis    S/P  biphosphonates x 5 yrs  . Persistent atrial fibrillation [facility])    Past Surgical  History:  Procedure Laterality Date  . CARDIOVERSION N/A 03/31/2015   Procedure: CARDIOVERSION;  Surgeon: Sanda Klein, MD;  Location: [place];  Service: Cardiovascular;  Laterality: N/A;  . CARDIOVERSION N/A 12/21/2015   Procedure: CARDIOVERSION;  Surgeon: Lelon Perla, MD;  Location: [facility] ENDOSCOPY;  Service: Cardiovascular;  Laterality: N/A;  . CARDIOVERSION N/A 01/09/2017   Procedure: CARDIOVERSION;  Surgeon: Jerline Pain, MD;  Location: [place];  Service: Cardiovascular;  Laterality: N/A;  . CARDIOVERSION N/A 07/09/2019   Procedure: CARDIOVERSION;  Surgeon: Sanda Klein, MD;  Location: [place] ENDOSCOPY;  Service: Cardiovascular;  Laterality: N/A;  . CARDIOVERSION N/A 01/14/2020   Procedure: CARDIOVERSION;  Surgeon: Geralynn Rile, MD;  Location: [place];  Service: Cardiovascular;  Laterality: N/A;  . CARDIOVERSION N/A 02/23/2020   Procedure: CARDIOVERSION;  Surgeon: Jerline Pain, MD;  Location: [facility] ENDOSCOPY;  Service: Cardiovascular;  Laterality: N/A;  . COLONOSCOPY W/ POLYPECTOMY  2006  . DILATION AND CURETTAGE OF UTERUS    . MOUTH SURGERY  2009-2010   implants, Dr. Marcelyn Ditty    Current Outpatient Medications  Medication Sig Dispense Refill  . acetaminophen (TYLENOL) 500 MG tablet Take 500 mg by mouth daily as needed (arthritis pain).     Marland Kitchen amiodarone (PACERONE) 200 MG tablet Take 0.5 tablets (100 mg total) by mouth daily. 30 tablet 3  . ELIQUIS 5 MG TABS tablet TAKE 1 TABLET BY MOUTH  TWICE DAILY 180 tablet 1  . furosemide (LASIX) 40 MG tablet Take 0.5 tablets (20 mg total) by mouth daily as needed for fluid. 30 tablet   . metoprolol succinate (TOPROL-XL) 25 MG 24 hr tablet Take 1 tablet (25 mg total) by mouth daily. 30 tablet 3   No current facility-administered medications for this encounter.    No Known Allergies  Social History   Socioeconomic History  . Marital status: Married    Spouse name: Not on file  . Number of children: Not on file  . Years  of education: Not on file  . Highest education level: Not on file  Occupational History  . Occupation: Retired    Fish farm manager: [employer]  . Smoking status: Former Smoker    Quit date: 10/30/1976    Years since quitting: 43.3  . Smokeless tobacco: Never Used  . Tobacco comment: Patient would ONLY smoke occasionally   Substance and Sexual Activity  . Alcohol use: No  . Drug use: No  . Sexual activity: Not on file  Other Topics Concern  . Not on file  Social History Narrative   Regular exercise: yes: walking 30 minute once daily   Social Determinants of Health   Financial Resource Strain:   . Difficulty of Paying Living Expenses:   Food Insecurity:   . Worried About Charity fundraiser in the Last Year:   . Arboriculturist in the Last Year:   Transportation Needs:   . Film/video editor (Medical):   Marland Kitchen Lack of Transportation (Non-Medical):   Physical Activity:   . Days of Exercise per Week:   . Minutes of Exercise per Session:   Stress:   . Feeling of Stress :   Social Connections:   . Frequency of Communication with Friends and Family:   . Frequency of Social Gatherings with Friends and Family:   . Attends Religious Services:   . Active Member of Clubs or Organizations:   . Attends Archivist Meetings:   Marland Kitchen Marital Status:   Intimate Partner Violence:   . Fear of Current or Ex-Partner:   . Emotionally Abused:   Marland Kitchen Physically Abused:   . Sexually Abused:     Family History  Problem Relation Age of Onset  . Coronary artery disease Father        MI in 64s  . Cancer Sister        breast  . Coronary artery disease Sister        stent 2009  . Cancer Paternal Aunt        colon, possible breast cancer  . Arthritis Other        aunts  . Stroke Neg Hx     ROS- All systems are reviewed and negative except as per the HPI above  Physical Exam: Vitals:   03/01/20 1508  BP: 130/70  Pulse: (!) 59  Weight: 66 kg  Height: 5\' 4"  (1.626 m)   Wt  Readings from Last 3 Encounters:  03/01/20 66 kg  02/23/20 65.4 kg  02/11/20 65.4 kg    Labs: Lab Results  Component Value Date   NA 140 02/23/2020   K 4.2 02/23/2020   CL 104 02/23/2020   CO2 27 02/11/2020   GLUCOSE 91 02/23/2020   BUN 17 02/23/2020   CREATININE 0.90 02/23/2020   CALCIUM 8.9 02/11/2020   MG 2.2 04/06/2017   Lab Results  Component Value Date   INR 1.0 01/07/2020  Lab Results  Component Value Date   CHOL 189 06/24/2019   HDL 54 06/24/2019   LDLCALC 117 (H) 06/24/2019   TRIG 88 06/24/2019     GEN- The patient is well appearing, alert and oriented x 3 today.   Head- normocephalic, atraumatic Eyes-  Sclera clear, conjunctiva pink Ears- hearing intact Oropharynx- clear Neck- supple, no JVP Lymph- no cervical lymphadenopathy Lungs- Clear to ausculation bilaterally, normal work of breathing Heart - regular rate and rhythm, no murmurs, rubs or gallops, PMI not laterally displaced GI- soft, NT, ND, + BS Extremities- no clubbing, cyanosis, or edema MS- no significant deformity or atrophy Skin- no rash or lesion Psych- euthymic mood, full affect Neuro- strength and sensation are intact  EKG- Sinus brady at 59 bpm,first degree AV block, pr int 250 ms, qrs int 82 bpm, qtc mat 479 ms  Epic records reviewed    Assessment and Plan: 1. Persistent afib  Initially failed cardioversion  01/14/20 Converted  spontaneously  20 mins after unsuccessful shock x 3 She remains in SR today  She does not feel improved in SR  Continue  metoprolol at 25 mg qd Reduce amiodarone to 200 mg 1/2 tab  daily for nausea/weakness  complaints  Echo pending 01/07/20  2. LLE/CHF Lasix 20 mg as needed for fluid Daily weights  3. CHA2DS2VASc score of at least  4 Continue eliquis 5 mg bid   F/u in 2 weeks   Butch Penny C. Lataysha Vohra, [facility] [address] [place], [place] [address] [phone]

## 2020-03-01 NOTE — Patient Instructions (Signed)
Decrease Amiodarone to 100mg  once a day (1/2 tablet)

## 2020-03-08 ENCOUNTER — Ambulatory Visit (HOSPITAL_COMMUNITY): Payer: Medicare Other | Attending: Cardiology

## 2020-03-08 ENCOUNTER — Other Ambulatory Visit: Payer: Self-pay

## 2020-03-08 DIAGNOSIS — I480 Paroxysmal atrial fibrillation: Secondary | ICD-10-CM | POA: Diagnosis not present

## 2020-03-08 DIAGNOSIS — Z9889 Other specified postprocedural states: Secondary | ICD-10-CM | POA: Insufficient documentation

## 2020-03-08 DIAGNOSIS — Z9289 Personal history of other medical treatment: Secondary | ICD-10-CM

## 2020-03-17 ENCOUNTER — Encounter (HOSPITAL_COMMUNITY): Payer: Self-pay | Admitting: Nurse Practitioner

## 2020-03-17 ENCOUNTER — Other Ambulatory Visit: Payer: Self-pay

## 2020-03-17 ENCOUNTER — Ambulatory Visit (HOSPITAL_COMMUNITY)
Admission: RE | Admit: 2020-03-17 | Discharge: 2020-03-17 | Disposition: A | Discharge status: Home / Self Care | Payer: Medicare Other | Source: Ambulatory Visit | Attending: Nurse Practitioner | Admitting: Nurse Practitioner

## 2020-03-17 VITALS — BP 164/82 | HR 56 | Ht 64.0 in | Wt 138.6 lb

## 2020-03-17 DIAGNOSIS — Z8249 Family history of ischemic heart disease and other diseases of the circulatory system: Secondary | ICD-10-CM | POA: Diagnosis not present

## 2020-03-17 DIAGNOSIS — I110 Hypertensive heart disease with heart failure: Secondary | ICD-10-CM | POA: Diagnosis not present

## 2020-03-17 DIAGNOSIS — Z7901 Long term (current) use of anticoagulants: Secondary | ICD-10-CM | POA: Insufficient documentation

## 2020-03-17 DIAGNOSIS — E785 Hyperlipidemia, unspecified: Secondary | ICD-10-CM | POA: Diagnosis not present

## 2020-03-17 DIAGNOSIS — D6869 Other thrombophilia: Secondary | ICD-10-CM

## 2020-03-17 DIAGNOSIS — I4819 Other persistent atrial fibrillation: Secondary | ICD-10-CM | POA: Diagnosis not present

## 2020-03-17 DIAGNOSIS — I5030 Unspecified diastolic (congestive) heart failure: Secondary | ICD-10-CM | POA: Diagnosis not present

## 2020-03-17 DIAGNOSIS — I4891 Unspecified atrial fibrillation: Secondary | ICD-10-CM | POA: Diagnosis present

## 2020-03-17 DIAGNOSIS — Z87891 Personal history of nicotine dependence: Secondary | ICD-10-CM | POA: Insufficient documentation

## 2020-03-17 DIAGNOSIS — Z79899 Other long term (current) drug therapy: Secondary | ICD-10-CM | POA: Insufficient documentation

## 2020-03-17 NOTE — Progress Notes (Signed)
Primary Care Physician: Binnie Rail, MD Referring Physician: [name], NP   Molly Tucker is a 86 y.o. female with a h/o PAF that usually requires yearly cardioversion's. Her last cardioversion 3/17, was not successful. She is here to discuss antiarrythmic's as she has a tendency to develop diastolic HF in afib.  I saw pt last in 2018. Mostly followed by [name], NP.  Today, she is c/o of LLE x 3 days. She  takes lasix very irregularly. She is fatigued in afib. She cannot come into the hospital for [place] as she cares for her husband with dementia and can not be be away from him for that period of time. He was on amiodarone for some time until recently,  it has been  stopped.   F/u 4/14. She has been loading on amiodarone 200 mg bid for 3 weeks. She is  c/o of slight nausea. She remains in rate controlled afib. WIll plan on  cardioversion after another week. Will go ahead and reduce amio to daily hopefully helping the nausea.. She is taking with food. No missed anticoagulation.  F/u s/p DCCV, 03/01/20. Initially she was shocked x 3 and remained in afib, 20 mins later she went into SR and remains in SR today. Although  she is in SR, she does not feel as well. Continues with some mild nausea and weakness. Discussed  decreasing amiodarone to 100 mg daily.   F/u in afib clinic, 03/17/20. On last visit, amiodarone was decreased to 100 mg a day for queasiness. She remains in SR and  now feels much better. She also diuresed 6 lbs and feels this helped for her to feel better as well. She weights daily and with normalization of weight, will take lasix  prn going forward.   Today, she denies symptoms of palpitations, chest pain, shortness of breath, orthopnea, PND, lower extremity edema, dizziness, presyncope, syncope, or neurologic sequela. The patient is tolerating medications without difficulties and is otherwise without complaint today.   Past Medical History:  Diagnosis Date  . Atrial  tachycardia ([place])   . Carotid bruit 2005   right, carotid doppler: <39% occlusion  . Hyperlipidemia    LDL goal =<120  . Hyperplastic colonic polyp 2006   Dr. Carlean Purl, due 2016  . Hypertension   . Osteoporosis    S/P  biphosphonates x 5 yrs  . Persistent atrial fibrillation [facility])    Past Surgical History:  Procedure Laterality Date  . CARDIOVERSION N/A 03/31/2015   Procedure: CARDIOVERSION;  Surgeon: Sanda Klein, MD;  Location: [place];  Service: Cardiovascular;  Laterality: N/A;  . CARDIOVERSION N/A 12/21/2015   Procedure: CARDIOVERSION;  Surgeon: Lelon Perla, MD;  Location: [facility] ENDOSCOPY;  Service: Cardiovascular;  Laterality: N/A;  . CARDIOVERSION N/A 01/09/2017   Procedure: CARDIOVERSION;  Surgeon: Jerline Pain, MD;  Location: [place];  Service: Cardiovascular;  Laterality: N/A;  . CARDIOVERSION N/A 07/09/2019   Procedure: CARDIOVERSION;  Surgeon: Sanda Klein, MD;  Location: [facility] ENDOSCOPY;  Service: Cardiovascular;  Laterality: N/A;  . CARDIOVERSION N/A 01/14/2020   Procedure: CARDIOVERSION;  Surgeon: Geralynn Rile, MD;  Location: [place];  Service: Cardiovascular;  Laterality: N/A;  . CARDIOVERSION N/A 02/23/2020   Procedure: CARDIOVERSION;  Surgeon: Jerline Pain, MD;  Location: [facility] ENDOSCOPY;  Service: Cardiovascular;  Laterality: N/A;  . COLONOSCOPY W/ POLYPECTOMY  2006  . DILATION AND CURETTAGE OF UTERUS    . MOUTH SURGERY  2009-2010   implants, Dr. Marcelyn Ditty  Current Outpatient Medications  Medication Sig Dispense Refill  . acetaminophen (TYLENOL) 500 MG tablet Take 500 mg by mouth daily as needed (arthritis pain).     Marland Kitchen amiodarone (PACERONE) 200 MG tablet Take 0.5 tablets (100 mg total) by mouth daily. 30 tablet 3  . ELIQUIS 5 MG TABS tablet TAKE 1 TABLET BY MOUTH  TWICE DAILY 180 tablet 1  . furosemide (LASIX) 40 MG tablet Take 0.5 tablets (20 mg total) by mouth daily as needed for fluid. 30 tablet   . metoprolol succinate (TOPROL-XL) 25 MG  24 hr tablet Take 1 tablet (25 mg total) by mouth daily. 30 tablet 3   No current facility-administered medications for this encounter.    No Known Allergies  Social History   Socioeconomic History  . Marital status: Married    Spouse name: Not on file  . Number of children: Not on file  . Years of education: Not on file  . Highest education level: Not on file  Occupational History  . 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 Smoking status: Former Smoker    Quit date: 10/30/1976    Years since quitting: 43.4  . Smokeless tobacco: Never Used  . Tobacco comment: Patient would ONLY smoke occasionally   Substance and Sexual Activity  . Alcohol use: No  . Drug use: No  . Sexual activity: Not on file  Other Topics Concern  . Not on file  Social History Narrative   Regular exercise: yes: walking 30 minute once daily   Social Determinants of Health   Financial Resource Strain:   . Difficulty of Paying Living Expenses:   Food Insecurity:   . Worried About Charity fundraiser in the Last Year:   . Arboriculturist in the Last Year:   Transportation Needs:   . Film/video editor (Medical):   Marland Kitchen Lack of Transportation (Non-Medical):   Physical Activity:   . Days of Exercise per Week:   . Minutes of Exercise per Session:   Stress:   . Feeling of Stress :   Social Connections:   . Frequency of Communication with Friends and Family:   . Frequency of Social Gatherings with Friends and Family:   . Attends Religious Services:   . Active Member of Clubs or Organizations:   . Attends Archivist Meetings:   Marland Kitchen Marital Status:   Intimate Partner Violence:   . Fear of Current or Ex-Partner:   . Emotionally Abused:   Marland Kitchen Physically Abused:   . Sexually Abused:     Family History  Problem Relation Age of Onset  . Coronary artery disease Father        MI in 45s  . Cancer Sister        breast  . Coronary artery disease Sister        stent 2009  .  Cancer Paternal Aunt        colon, possible breast cancer  . Arthritis Other        aunts  . Stroke Neg Hx     ROS- All systems are reviewed and negative except as per the HPI above  Physical Exam: Vitals:   03/17/20 1405  BP: (!) 164/82  Pulse: (!) 56  Weight: 62.9 kg  Height: 5\' 4"  (1.626 m)   Wt Readings from Last 3 Encounters:  03/17/20 62.9 kg  03/01/20 66 kg  02/23/20 65.4 kg    Labs: Lab Results  Component Value Date   NA 140 02/23/2020   K 4.2 02/23/2020   CL 104 02/23/2020   CO2 27 02/11/2020   GLUCOSE 91 02/23/2020   BUN 17 02/23/2020   CREATININE 0.90 02/23/2020   CALCIUM 8.9 02/11/2020   MG 2.2 04/06/2017   Lab Results  Component Value Date   INR 1.0 01/07/2020   Lab Results  Component Value Date   CHOL 189 06/24/2019   HDL 54 06/24/2019   LDLCALC 117 (H) 06/24/2019   TRIG 88 06/24/2019     GEN- The patient is well appearing, alert and oriented x 3 today.   Head- normocephalic, atraumatic Eyes-  Sclera clear, conjunctiva pink Ears- hearing intact Oropharynx- clear Neck- supple, no JVP Lymph- no cervical lymphadenopathy Lungs- Clear to ausculation bilaterally, normal work of breathing Heart - regular rate and rhythm, no murmurs, rubs or gallops, PMI not laterally displaced GI- soft, NT, ND, + BS Extremities- no clubbing, cyanosis, or edema MS- no significant deformity or atrophy Skin- no rash or lesion Psych- euthymic mood, full affect Neuro- strength and sensation are intact  EKG- Sinus brady at 59 bpm,first degree AV block, pr int 250 ms, qrs int 82 bpm, qtc mat 479 ms  Epic records reviewed  Echo- 03/08/20-IMPRESSIONS    1. Left ventricular ejection fraction, by estimation, is 60 to 65%. The  left ventricle has normal function. The left ventricle has no regional  wall motion abnormalities. Left ventricular diastolic parameters are  consistent with Grade II diastolic  dysfunction (pseudonormalization). Elevated left ventricular  end-diastolic  pressure.  2. Right ventricular systolic function is normal. The right ventricular  size is moderately enlarged. There is moderately elevated pulmonary artery  systolic pressure. The estimated right ventricular systolic pressure is  Q000111Q mmHg.  3. Left atrial size was moderately dilated.  4. Right atrial size was severely dilated.  5. The mitral valve is normal in structure. Mild mitral valve  regurgitation. No evidence of mitral stenosis.  6. Tricuspid valve regurgitation is moderate.  7. The aortic valve is tricuspid. Aortic valve regurgitation is not  visualized. Mild aortic valve sclerosis is present, with no evidence of  aortic valve stenosis.  8. The inferior vena cava is normal in size with <50% respiratory  variability, suggesting right atrial pressure of 8 mmHg.   FINDINGS  Left Ventricle: Left ventricular ejection fraction, by estimation, is 60  to 65%. The left ventricle has normal function. The left ventricle has no  regional wall motion abnormalities. The left ventricular internal cavity  size was normal in size. There is  no left ventricular hypertrophy. Left ventricular diastolic parameters  are consistent with Grade II diastolic dysfunction (pseudonormalization).  Elevated left ventricular end-diastolic pressure.   Right Ventricle: The right ventricular size is moderately enlarged. No  increase in right ventricular wall thickness. Right ventricular systolic  function is normal. There is moderately elevated pulmonary artery systolic  pressure. The tricuspid regurgitant  velocity is 3.54 m/s, and with an assumed right atrial pressure of 8  mmHg, the estimated right ventricular systolic pressure is [id number] mmHg.   Left Atrium: Left atrial size was moderately dilated.   Right Atrium: Right atrial size was severely dilated.   Pericardium: There is no evidence of pericardial effusion.   Mitral Valve: The mitral valve is normal in structure.  Normal mobility of  the mitral valve leaflets. Mild mitral valve regurgitation. No evidence of  mitral valve stenosis.   Tricuspid Valve: The tricuspid valve is normal in  structure. Tricuspid  valve regurgitation is moderate . No evidence of tricuspid stenosis.   Aortic Valve: The aortic valve is tricuspid. Aortic valve regurgitation is  not visualized. Mild aortic valve sclerosis is present, with no evidence  of aortic valve stenosis.   Pulmonic Valve: The pulmonic valve was normal in structure. Pulmonic valve  regurgitation is not visualized. No evidence of pulmonic stenosis.   Aorta: The aortic root is normal in size and structure.   Venous: The inferior vena cava is normal in size with less than 50%  respiratory variability, suggesting right atrial pressure of 8 mmHg.   Assessment and Plan: 1. Persistent afib  Initially failed cardioversion  01/14/20 Converted  spontaneously  20 mins after unsuccessful shock x 3 Has  remained in SR  Amiodarone  reduced to 100 mg daily for quesiness, resolved with lowering of dose and diuresis of 6 lbs She now feels so much better in SR  Continue  metoprolol at 25 mg qd  2. LLE/CHF Lasix 20 mg as needed for fluid Daily weights  3. CHA2DS2VASc score of at least  4 Continue eliquis 5 mg bid   F/u with [name], NP 04/19/20, she would benefit from  repeating amio screening labs then  afib clinic as needed  Butch Penny C. Nic Lampe, [facility] [address] [place], [place] [address] [phone]

## 2020-03-28 ENCOUNTER — Other Ambulatory Visit: Payer: Self-pay | Admitting: Nurse Practitioner

## 2020-03-30 NOTE — Telephone Encounter (Signed)
Age 86, weight 63kg, SCr 0.9 on 02/23/20, afib indication, last OV May 2021.

## 2020-04-14 NOTE — Progress Notes (Signed)
CARDIOLOGY OFFICE NOTE  Date:  04/19/2020    Molly Tucker Date of Birth: 1932/01/27 Medical Record #[id number]  PCP:  Binnie Rail, MD  Cardiologist:  Ree Shay  Chief Complaint  Patient presents with  . Atrial Fibrillation    History of Present Illness: Molly Tucker is a 86 y.o. female who presents today for a follow up visit. Seen for [name].Primarily follows with me.She has been seen in the AF clinic also.   She has a history of PAF, HTN, HLD, and osteoporosis. She has had prior cardioversions back to NSR - with improvement in symptoms. Cardioverted in June of 2016, February of 2017, March of 2018 and September of 2020. She tends to have worsening volume overload when in AF.   When seen in December - she was doing well - was in NSR at that time. Reverted back in March to AF - we got her cardioverted again which failed and sent her to the AF clinic to discuss options - ended up being placed on amiodarone with repeat cardioversion with 3 shocks and conversion back to NSR 20 minutes later  (her husband is on this as well). She had trouble with tolerating the dose and is now only on 100 mg a day.  Echo was updated - she typically has worsening diastolic heart failure when in AF. Last seen in the AF clinic about a month ago.   The patient does not have symptoms concerning for COVID-19 infection (fever, chills, cough, or new shortness of breath).   Comes in today. [name]. She will need surveillance labs today. She remains in NSR. Slow to feel better this time after getting back in to rhythm. Feels much better and now back to her baseline. Can do all her own activities now. Tolerating her medicines. No chest pain. Weight is down a few pounds. No swelling. She is feeling much better and happy with how she is doing. BP typically lower at home.    Past Medical History:  Diagnosis Date  . Atrial tachycardia ([place])   . Carotid bruit 2005   right, carotid  doppler: <39% occlusion  . Hyperlipidemia    LDL goal =<120  . Hyperplastic colonic polyp 2006   Dr. Carlean Purl, due 2016  . Hypertension   . Osteoporosis    S/P  biphosphonates x 5 yrs  . Persistent atrial fibrillation [facility])     Past Surgical History:  Procedure Laterality Date  . CARDIOVERSION N/A 03/31/2015   Procedure: CARDIOVERSION;  Surgeon: Sanda Klein, MD;  Location: [place];  Service: Cardiovascular;  Laterality: N/A;  . CARDIOVERSION N/A 12/21/2015   Procedure: CARDIOVERSION;  Surgeon: Lelon Perla, MD;  Location: [facility] ENDOSCOPY;  Service: Cardiovascular;  Laterality: N/A;  . CARDIOVERSION N/A 01/09/2017   Procedure: CARDIOVERSION;  Surgeon: Jerline Pain, MD;  Location: [place];  Service: Cardiovascular;  Laterality: N/A;  . CARDIOVERSION N/A 07/09/2019   Procedure: CARDIOVERSION;  Surgeon: Sanda Klein, MD;  Location: [place] ENDOSCOPY;  Service: Cardiovascular;  Laterality: N/A;  . CARDIOVERSION N/A 01/14/2020   Procedure: CARDIOVERSION;  Surgeon: Geralynn Rile, MD;  Location: [place];  Service: Cardiovascular;  Laterality: N/A;  . CARDIOVERSION N/A 02/23/2020   Procedure: CARDIOVERSION;  Surgeon: Jerline Pain, MD;  Location: [facility] ENDOSCOPY;  Service: Cardiovascular;  Laterality: N/A;  . COLONOSCOPY W/ POLYPECTOMY  2006  . DILATION AND CURETTAGE OF UTERUS    . MOUTH SURGERY  2009-2010   implants, Dr.  Wm Brown     Medications: Current Meds  Medication Sig  . acetaminophen (TYLENOL) 500 MG tablet Take 500 mg by mouth daily as needed (arthritis pain).   Marland Kitchen amiodarone (PACERONE) 200 MG tablet Take 0.5 tablets (100 mg total) by mouth daily.  Marland Kitchen ELIQUIS 5 MG TABS tablet TAKE 1 TABLET BY MOUTH  TWICE DAILY  . furosemide (LASIX) 40 MG tablet Take 0.5 tablets (20 mg total) by mouth daily as needed for fluid.  . metoprolol succinate (TOPROL-XL) 25 MG 24 hr tablet Take 1 tablet (25 mg total) by mouth daily.     Allergies: No Known Allergies  Social  History: The patient  reports that she quit smoking about 43 years ago. She has never used smokeless tobacco. She reports that she does not drink alcohol and does not use drugs.   Family History: The patient's family history includes Arthritis in an other family member; Cancer in her paternal aunt and sister; Coronary artery disease in her father and sister.   Review of Systems: Please see the history of present illness.   All other systems are reviewed and negative.   Physical Exam: VS:  BP 130/80   Pulse 61   Ht 5\' 4"  (1.626 m)   Wt 136 lb 12.8 oz (62.1 kg)   SpO2 96%   BMI 23.48 kg/m  .  BMI Body mass index is 23.48 kg/m.  Wt Readings from Last 3 Encounters:  04/19/20 136 lb 12.8 oz (62.1 kg)  03/17/20 138 lb 9.6 oz (62.9 kg)  03/01/20 145 lb 9.6 oz (66 kg)    General: Pleasant. Elderly but looks younger than her stated age. Alert and in no acute distress.   Cardiac: Regular rate and rhythm. No murmurs, rubs, or gallops. No edema. Has varicosities.  Respiratory:  Lungs are clear to auscultation bilaterally with normal work of breathing.  GI: Soft and nontender.  MS: No deformity or atrophy. Gait and ROM intact.  Skin: Warm and dry. Color is normal.  Neuro:  Strength and sensation are intact and no gross focal deficits noted.  Psych: Alert, appropriate and with normal affect.   LABORATORY DATA:  EKG:  EKG is ordered today.  Personally reviewed by me. This demonstrates NSR - poor R wave progression. Borderline 1st degree AV block.   Lab Results  Component Value Date   WBC 5.0 02/11/2020   HGB 15.6 (H) 02/23/2020   HCT 46.0 02/23/2020   PLT 200 02/11/2020   GLUCOSE 91 02/23/2020   CHOL 189 06/24/2019   TRIG 88 06/24/2019   HDL 54 06/24/2019   LDLCALC 117 (H) 06/24/2019   ALT 21 01/29/2020   AST 25 01/29/2020   NA 140 02/23/2020   K 4.2 02/23/2020   CL 104 02/23/2020   CREATININE 0.90 02/23/2020   BUN 17 02/23/2020   CO2 27 02/11/2020   TSH 3.827 01/29/2020    INR 1.0 01/07/2020   HGBA1C 5.8 12/22/2009     BNP (last 3 results) No results for input(s): BNP in the last 8760 hours.  ProBNP (last 3 results) No results for input(s): PROBNP in the last 8760 hours.   Other Studies Reviewed Today:  Electrical Cardioversion Procedure Note 01/2020  Procedure: Electrical Cardioversion Indications:  Atrial Fibrillation  Time Out: Verified patient identification, verified procedure,medications/allergies/relevent history reviewed, required imaging and test results available.  Performed  Procedure Details  The patient was NPO after midnight. Anesthesia was administered at the beside  by Dr. Ermalene Postin with 60mg  of  propofol.  Cardioversion was performed with synchronized biphasic defibrillation via AP pads with 120, 150, 200 joules.  3 attempt(s) were performed.  The patient converted to normal sinus rhythm for about 30 seconds after the second shock then reverted back to atrial flutter with variable conduction. Another attempt at 200J was made but unsuccessful. The patient tolerated the procedure well   IMPRESSION:  Unsuccessful cardioversion of atrial fibrillation. Currently in slow atrial flutter with variable conduction HR 80-90.    [name] 02/23/2020, 11:27 AM   ECHO IMPRESSIONS 02/2020  1. Left ventricular ejection fraction, by estimation, is 60 to 65%. The  left ventricle has normal function. The left ventricle has no regional  wall motion abnormalities. Left ventricular diastolic parameters are  consistent with Grade II diastolic  dysfunction (pseudonormalization). Elevated left ventricular end-diastolic  pressure.  2. Right ventricular systolic function is normal. The right ventricular  size is moderately enlarged. There is moderately elevated pulmonary artery  systolic pressure. The estimated right ventricular systolic pressure is  00.1 mmHg.  3. Left atrial size was moderately dilated.  4. Right atrial size was severely  dilated.  5. The mitral valve is normal in structure. Mild mitral valve  regurgitation. No evidence of mitral stenosis.  6. Tricuspid valve regurgitation is moderate.  7. The aortic valve is tricuspid. Aortic valve regurgitation is not  visualized. Mild aortic valve sclerosis is present, with no evidence of  aortic valve stenosis.  8. The inferior vena cava is normal in size with <50% respiratory  variability, suggesting right atrial pressure of 8 mmHg.     Assessment/Plan:  1. PAF - with multiple cardioversions - now on low dose amiodarone - last cardioversion from April - converted 20 minutes later after 3 failed shocks - remains in NSR - lab today. She is felt to be doing well. We will continue low dose amiodarone.   2. HTN - typically with better control at home - ok here today. No changes made today.   3. Chronic diastolic HF - aggravated when in AF - back in NSR and doing well. Lab today.   4. Chronic anticoagulation - no problems noted - lab today.   Current medicines are reviewed with the patient today.  The patient does not have concerns regarding medicines other than what has been noted above.  The following changes have been made:  See above.  Labs/ tests ordered today include:    Orders Placed This Encounter  Procedures  . Basic metabolic panel  . CBC  . Hepatic function panel  . TSH  . Lipid panel  . EKG 12-Lead     Disposition:   FU with me in 4 months with labs and EKG.   Patient is agreeable to this plan and will call if any problems develop in the interim.   SignedTruitt Merle, NP  04/19/2020 2:39 PM  [facility] [address] [place], [place]  [address] Phone: [phone] Fax: [phone]

## 2020-04-19 ENCOUNTER — Encounter: Payer: Self-pay | Admitting: Nurse Practitioner

## 2020-04-19 ENCOUNTER — Other Ambulatory Visit: Payer: Self-pay

## 2020-04-19 ENCOUNTER — Ambulatory Visit: Payer: Medicare Other | Admitting: Nurse Practitioner

## 2020-04-19 VITALS — BP 130/80 | HR 61 | Ht 64.0 in | Wt 136.8 lb

## 2020-04-19 DIAGNOSIS — Z79899 Other long term (current) drug therapy: Secondary | ICD-10-CM | POA: Diagnosis not present

## 2020-04-19 DIAGNOSIS — I4819 Other persistent atrial fibrillation: Secondary | ICD-10-CM

## 2020-04-19 DIAGNOSIS — I480 Paroxysmal atrial fibrillation: Secondary | ICD-10-CM | POA: Diagnosis not present

## 2020-04-19 DIAGNOSIS — I10 Essential (primary) hypertension: Secondary | ICD-10-CM | POA: Diagnosis not present

## 2020-04-19 NOTE — Patient Instructions (Addendum)
After Visit Summary:  We will be checking the following labs today - BMET, CBC, HPF, Lipids and TSH   Medication Instructions:    Continue with your current medicines.    If you need a refill on your cardiac medications before your next appointment, please call your pharmacy.     Testing/Procedures To Be Arranged:  N/A  Follow-Up:   See me in 4 months - we will do lab on return with an EKG    At [facility], you and your health needs are our priority.  As part of our continuing mission to provide you with exceptional heart care, we have created designated Provider Care Teams.  These Care Teams include your primary Cardiologist (physician) and Advanced Practice Providers (APPs -  Physician Assistants and Nurse Practitioners) who all work together to provide you with the care you need, when you need it.  Special Instructions:   Stay safe, wash your hands for at least 20 seconds and wear a mask when needed.   It was good to talk with you today.    Call the [place] office at [phone] if you have any questions, problems or concerns.

## 2020-04-20 ENCOUNTER — Telehealth: Payer: Self-pay | Admitting: Nurse Practitioner

## 2020-04-20 LAB — BASIC METABOLIC PANEL
BUN/Creatinine Ratio: 17 (ref 12–28)
BUN: 16 mg/dL (ref 8–27)
CO2: 26 mmol/L (ref 20–29)
Calcium: 9.4 mg/dL (ref 8.7–10.3)
Chloride: 104 mmol/L (ref 96–106)
Creatinine, Ser: 0.93 mg/dL (ref 0.57–1.00)
GFR calc Af Amer: 64 mL/min/{1.73_m2} (ref >59)
GFR calc non Af Amer: 56 mL/min/{1.73_m2} — ABNORMAL LOW (ref >59)
Glucose: 101 mg/dL — ABNORMAL HIGH (ref 65–99)
Potassium: 4.9 mmol/L (ref 3.5–5.2)
Sodium: 141 mmol/L (ref 134–144)

## 2020-04-20 LAB — CBC
Hematocrit: 40.6 % (ref 34.0–46.6)
Hemoglobin: 14.1 g/dL (ref 11.1–15.9)
MCH: 30.3 pg (ref 26.6–33.0)
MCHC: 34.7 g/dL (ref 31.5–35.7)
MCV: 87 fL (ref 79–97)
Platelets: 182 10*3/uL (ref 150–450)
RBC: 4.65 x10E6/uL (ref 3.77–5.28)
RDW: 12.6 % (ref 11.7–15.4)
WBC: 5.6 10*3/uL (ref 3.4–10.8)

## 2020-04-20 LAB — LIPID PANEL
Chol/HDL Ratio: 3.3 ratio (ref 0.0–4.4)
Cholesterol, Total: 195 mg/dL (ref 100–199)
HDL: 59 mg/dL (ref >39)
LDL Chol Calc (NIH): 118 mg/dL — ABNORMAL HIGH (ref 0–99)
Triglycerides: 102 mg/dL (ref 0–149)
VLDL Cholesterol Cal: 18 mg/dL (ref 5–40)

## 2020-04-20 LAB — HEPATIC FUNCTION PANEL
ALT: 13 IU/L (ref 0–32)
AST: 18 IU/L (ref 0–40)
Albumin: 4 g/dL (ref 3.6–4.6)
Alkaline Phosphatase: 51 IU/L (ref 48–121)
Bilirubin Total: 0.5 mg/dL (ref 0.0–1.2)
Bilirubin, Direct: 0.17 mg/dL (ref 0.00–0.40)
Total Protein: 6 g/dL (ref 6.0–8.5)

## 2020-04-20 LAB — TSH: TSH: 2.62 u[IU]/mL (ref 0.450–4.500)

## 2020-04-20 NOTE — Telephone Encounter (Signed)
Pt is returning phone call to danielle

## 2020-04-20 NOTE — Telephone Encounter (Signed)
Called pt back in the middle of something will call pt back in a bit.

## 2020-05-25 ENCOUNTER — Telehealth: Payer: Self-pay | Admitting: Nurse Practitioner

## 2020-05-25 DIAGNOSIS — H353131 Nonexudative age-related macular degeneration, bilateral, early dry stage: Secondary | ICD-10-CM | POA: Diagnosis not present

## 2020-05-25 DIAGNOSIS — H5203 Hypermetropia, bilateral: Secondary | ICD-10-CM | POA: Diagnosis not present

## 2020-05-25 DIAGNOSIS — H52203 Unspecified astigmatism, bilateral: Secondary | ICD-10-CM | POA: Diagnosis not present

## 2020-05-25 DIAGNOSIS — H2513 Age-related nuclear cataract, bilateral: Secondary | ICD-10-CM | POA: Diagnosis not present

## 2020-05-25 MED ORDER — AMIODARONE HCL 200 MG PO TABS: 100.0000 mg | ORAL_TABLET | Freq: Every day | ORAL | 3 refills | Status: DC

## 2020-05-25 NOTE — Telephone Encounter (Signed)
*  STAT* If patient is at the pharmacy, call can be transferred to refill team.   1. Which medications need to be refilled? (please list name of each medication and dose if known) need a new prescription for Amiodarone 200 mg  2. Which pharmacy/location (including street and city if local pharmacy) is medication to be sent to? [facility]. Do they need a 30 day or 90 day supply? 90 days and refills

## 2020-05-25 NOTE — Telephone Encounter (Signed)
Pt's medication was sent to pt's pharmacy as requested. Confirmation received.  °

## 2020-06-03 DIAGNOSIS — H2513 Age-related nuclear cataract, bilateral: Secondary | ICD-10-CM | POA: Diagnosis not present

## 2020-06-03 DIAGNOSIS — H25013 Cortical age-related cataract, bilateral: Secondary | ICD-10-CM | POA: Diagnosis not present

## 2020-07-06 DIAGNOSIS — H25811 Combined forms of age-related cataract, right eye: Secondary | ICD-10-CM | POA: Diagnosis not present

## 2020-07-06 DIAGNOSIS — H25011 Cortical age-related cataract, right eye: Secondary | ICD-10-CM | POA: Diagnosis not present

## 2020-07-06 DIAGNOSIS — H2511 Age-related nuclear cataract, right eye: Secondary | ICD-10-CM | POA: Diagnosis not present

## 2020-07-12 ENCOUNTER — Telehealth: Payer: Self-pay | Admitting: Internal Medicine

## 2020-07-12 NOTE — Progress Notes (Signed)
  Chronic Care Management   Note  07/12/2020 Name: Molly Tucker MRN: [id number] DOB: 01-23-1932  Molly Tucker is a 86 y.o. year old female who is a primary care patient of Burns, Claudina Lick, MD. I reached out to Molly Tucker by phone today in response to a referral sent by Ms. Ethelene Hal PCP, Binnie Rail, MD.   Molly Tucker was given information about Chronic Care Management services today including:  1. CCM service includes personalized support from designated clinical staff supervised by her physician, including individualized plan of care and coordination with other care providers 2. 24/7 contact phone numbers for assistance for urgent and routine care needs. 3. Service will only be billed when office clinical staff spend 20 minutes or more in a month to coordinate care. 4. Only one practitioner may furnish and bill the service in a calendar month. 5. The patient may stop CCM services at any time (effective at the end of the month) by phone call to the office staff.   Patient agreed to services and verbal consent obtained.   Follow up plan:   Carley Perdue [employer] Scheduler

## 2020-07-29 ENCOUNTER — Other Ambulatory Visit: Payer: Self-pay | Admitting: Nurse Practitioner

## 2020-07-29 DIAGNOSIS — I480 Paroxysmal atrial fibrillation: Secondary | ICD-10-CM

## 2020-07-29 DIAGNOSIS — Z7901 Long term (current) use of anticoagulants: Secondary | ICD-10-CM

## 2020-07-29 MED ORDER — METOPROLOL SUCCINATE ER 25 MG PO TB24: 25.0000 mg | ORAL_TABLET | Freq: Every day | ORAL | 3 refills | Status: DC

## 2020-08-09 NOTE — Progress Notes (Signed)
CARDIOLOGY OFFICE NOTE  Date:  08/16/2020    [name] Date of Birth: January 12, 1932 Medical Record #[id number]  PCP:  Binnie Rail, MD  Cardiologist:  Ree Shay    Chief Complaint  Patient presents with  . Follow-up    History of Present Illness: Molly Tucker is a 86 y.o. female who presents today for a follow up visit. Seen for [name].Primarily follows with me.She has been seen in the AF clinic also.   She has a history of PAF, HTN, HLD, and osteoporosis. She has had prior cardioversions back to NSR - with improvement in symptoms. Cardioverted in June of 2016, February of 2017,March of 2018and September of 2020.She tends to have worsening volume overload when in AF.   When seen last December - she was doing well - was in NSR at that time.Reverted back in March to AF - we got her cardioverted again which failed and sent her to the AF clinic to discuss options - ended up being placed on amiodarone with repeat cardioversion with 3 shocks and conversion back to NSR 20 minutes later  (her husband is on this as well). She had trouble with tolerating the dose and is now only on 100 mg a day.  Echo was updated - she typically has worsening diastolic heart failure when in AF.   Last seen by me back in mid June - she was doing ok - maintaining NSR and tolerating her regimen. BP is typically lower at home.    Comes in today. Here with [name] - I am seeing him today as well. She is doing well - almost 6 months out from this last AF episode. Has had one cataract done - tomorrow has the next one - BP was quite high with the first - she is going to take 1/2 Klonopin tomorrow prior to try and not let that happen again. She otherwise feels good. No palpitations. No chest pain. No swelling. Not short of breath. She would like a flu shot today. No swelling. Overall, no real concerns from our standpoint but does note a nodular area towards the 6 o'clock area of the left nipple.     Past Medical History:  Diagnosis Date  . Atrial tachycardia ([place])   . Carotid bruit 2005   right, carotid doppler: <39% occlusion  . Hyperlipidemia    LDL goal =<120  . Hyperplastic colonic polyp 2006   Dr. Carlean Purl, due 2016  . Hypertension   . Osteoporosis    S/P  biphosphonates x 5 yrs  . Persistent atrial fibrillation [facility])     Past Surgical History:  Procedure Laterality Date  . CARDIOVERSION N/A 03/31/2015   Procedure: CARDIOVERSION;  Surgeon: Sanda Klein, MD;  Location: [place];  Service: Cardiovascular;  Laterality: N/A;  . CARDIOVERSION N/A 12/21/2015   Procedure: CARDIOVERSION;  Surgeon: Lelon Perla, MD;  Location: [facility] ENDOSCOPY;  Service: Cardiovascular;  Laterality: N/A;  . CARDIOVERSION N/A 01/09/2017   Procedure: CARDIOVERSION;  Surgeon: Jerline Pain, MD;  Location: [place];  Service: Cardiovascular;  Laterality: N/A;  . CARDIOVERSION N/A 07/09/2019   Procedure: CARDIOVERSION;  Surgeon: Sanda Klein, MD;  Location: [place] ENDOSCOPY;  Service: Cardiovascular;  Laterality: N/A;  . CARDIOVERSION N/A 01/14/2020   Procedure: CARDIOVERSION;  Surgeon: Geralynn Rile, MD;  Location: [place];  Service: Cardiovascular;  Laterality: N/A;  . CARDIOVERSION N/A 02/23/2020   Procedure: CARDIOVERSION;  Surgeon: Jerline Pain, MD;  Location: [place] ENDOSCOPY;  Service: Cardiovascular;  Laterality: N/A;  . COLONOSCOPY W/ POLYPECTOMY  2006  . DILATION AND CURETTAGE OF UTERUS    . MOUTH SURGERY  2009-2010   implants, Dr. Marcelyn Ditty     Medications: Current Meds  Medication Sig  . acetaminophen (TYLENOL) 500 MG tablet Take 500 mg by mouth daily as needed (arthritis pain).   Marland Kitchen amiodarone (PACERONE) 200 MG tablet Take 0.5 tablets (100 mg total) by mouth daily.  Marland Kitchen ELIQUIS 5 MG TABS tablet TAKE 1 TABLET BY MOUTH  TWICE DAILY  . furosemide (LASIX) 40 MG tablet Take 0.5 tablets (20 mg total) by mouth daily as needed for fluid.  . metoprolol succinate (TOPROL-XL) 25 MG  24 hr tablet Take 1 tablet (25 mg total) by mouth daily.  Marland Kitchen moxifloxacin (VIGAMOX) 0.5 % ophthalmic solution Place into the left eye.  . prednisoLONE acetate (PRED FORTE) 1 % ophthalmic suspension Place 1 drop into the right eye 4 (four) times daily.     Allergies: No Known Allergies  Social History: The patient  reports that she quit smoking about 43 years ago. She has never used smokeless tobacco. She reports that she does not drink alcohol and does not use drugs.   Family History: The patient's family history includes Arthritis in an other family member; Cancer in her paternal aunt and sister; Coronary artery disease in her father and sister.   Review of Systems: Please see the history of present illness.   All other systems are reviewed and negative.   Physical Exam: VS:  BP 130/80   Pulse 60   Ht 5\' 4"  (1.626 m)   Wt 137 lb 9.6 oz (62.4 kg)   SpO2 97%   BMI 23.62 kg/m  .  BMI Body mass index is 23.62 kg/m.  Wt Readings from Last 3 Encounters:  08/16/20 137 lb 9.6 oz (62.4 kg)  04/19/20 136 lb 12.8 oz (62.1 kg)  03/17/20 138 lb 9.6 oz (62.9 kg)    General: Pleasant. Alert and in no acute distress.  She looks younger than her stated age.  Cardiac: Regular rate and rhythm. No murmurs, rubs, or gallops. No edema. Has varicosities.  Respiratory:  Lungs are clear to auscultation bilaterally with normal work of breathing. There is an irregular place noted under the left nipple.  GI: Soft and nontender.  MS: No deformity or atrophy. Gait and ROM intact.  Skin: Warm and dry. Color is normal.  Neuro:  Strength and sensation are intact and no gross focal deficits noted.  Psych: Alert, appropriate and with normal affect.   LABORATORY DATA:  EKG:  EKG is ordered today.  Personally reviewed by me. This demonstrates NSR - HR is 60.  Lab Results  Component Value Date   WBC 5.6 04/19/2020   HGB 14.1 04/19/2020   HCT 40.6 04/19/2020   PLT 182 04/19/2020   GLUCOSE 101 (H)  04/19/2020   CHOL 195 04/19/2020   TRIG 102 04/19/2020   HDL 59 04/19/2020   LDLCALC 118 (H) 04/19/2020   ALT 13 04/19/2020   AST 18 04/19/2020   NA 141 04/19/2020   K 4.9 04/19/2020   CL 104 04/19/2020   CREATININE 0.93 04/19/2020   BUN 16 04/19/2020   CO2 26 04/19/2020   TSH 2.620 04/19/2020   INR 1.0 01/07/2020   HGBA1C 5.8 12/22/2009     BNP (last 3 results) No results for input(s): BNP in the last 8760 hours.  ProBNP (last 3 results) No results for input(s):  PROBNP in the last 8760 hours.   Other Studies Reviewed Today:  Electrical Cardioversion Procedure Note 01/2020  Procedure: Electrical Cardioversion Indications:Atrial Fibrillation  Time [id number] patient identification, verified procedure,medications/allergies/relevent history reviewed, required imaging and test results available. Performed  Procedure Details  The patient was NPO after midnight. Anesthesia was administered at the beside by Dr. Artist Beach 60mg  of propofol. Cardioversion was performed with synchronized biphasic defibrillation via AP pads with 120, 150, 200joules. 3attempt(s) were performed. The patient converted to normal sinus rhythm for about 30 seconds after the second shock then reverted back to atrial flutter with variable conduction. Another attempt at 200J was made but unsuccessful. The patient tolerated the procedure well   IMPRESSION:  Unsuccessful cardioversion of atrial fibrillation. Currently in slow atrial flutter with variable conduction HR 80-90.    Mark Skains 02/23/2020,11:27 AM   ECHO IMPRESSIONS 02/2020  1. Left ventricular ejection fraction, by estimation, is 60 to 65%. The  left ventricle has normal function. The left ventricle has no regional  wall motion abnormalities. Left ventricular diastolic parameters are  consistent with Grade II diastolic  dysfunction (pseudonormalization). Elevated left ventricular end-diastolic  pressure.  2.  Right ventricular systolic function is normal. The right ventricular  size is moderately enlarged. There is moderately elevated pulmonary artery  systolic pressure. The estimated right ventricular systolic pressure is  57.3 mmHg.  3. Left atrial size was moderately dilated.  4. Right atrial size was severely dilated.  5. The mitral valve is normal in structure. Mild mitral valve  regurgitation. No evidence of mitral stenosis.  6. Tricuspid valve regurgitation is moderate.  7. The aortic valve is tricuspid. Aortic valve regurgitation is not  visualized. Mild aortic valve sclerosis is present, with no evidence of  aortic valve stenosis.  8. The inferior vena cava is normal in size with <50% respiratory  variability, suggesting right atrial pressure of 8 mmHg.     Assessment/Plan:  1. PAF - with prior multiple cardioversions - now on low dose amiodarone and tolerating well. She was last cardioverted back in April - 20 minutes after 3 failed shocks. She remains in sinus by exam and by EKG. Lab today. Continue current therapy.   2. HTN - has good control at home. It was quite elevated when she had her first cataract done - she feels this was more due to anxiety. She is going to take 1/2 her Klonopin tomorrow. Would continue her current regimen.   3. Chronic diastolic HF - looks very stable and euvolemic. Lab today.   4. Chronic anticoagulation - lab today - no problems noted. Continue her current therapy. She still meets requirement for full dosing of Eliquis.   5. Health maintenance - flu shot today.  ? Place noted on exam of the left breast - she needs to see her PCP - may need mammogram/ultrasound.    Current medicines are reviewed with the patient today.  The patient does not have concerns regarding medicines other than what has been noted above.  The following changes have been made:  See above.  Labs/ tests ordered today include:    Orders Placed This Encounter    Procedures  . Basic metabolic panel  . CBC  . Hepatic function panel  . TSH  . EKG 12-Lead     Disposition:   FU with me in 4 months with EKG - they are aware that I am leaving - we will transition them to Joseph Art' who has agreed to manage.   Patient  is agreeable to this plan and will call if any problems develop in the interim.   SignedTruitt Merle, NP  08/16/2020 2:26 PM  [facility] [address] [place], [place]  [address] Phone: [phone] Fax: [phone]

## 2020-08-16 ENCOUNTER — Other Ambulatory Visit: Payer: Self-pay

## 2020-08-16 ENCOUNTER — Encounter: Payer: Self-pay | Admitting: Nurse Practitioner

## 2020-08-16 ENCOUNTER — Ambulatory Visit: Payer: Medicare Other | Admitting: Nurse Practitioner

## 2020-08-16 VITALS — BP 130/80 | HR 60 | Ht 64.0 in | Wt 137.6 lb

## 2020-08-16 DIAGNOSIS — I480 Paroxysmal atrial fibrillation: Secondary | ICD-10-CM

## 2020-08-16 DIAGNOSIS — I10 Essential (primary) hypertension: Secondary | ICD-10-CM

## 2020-08-16 DIAGNOSIS — Z7901 Long term (current) use of anticoagulants: Secondary | ICD-10-CM

## 2020-08-16 DIAGNOSIS — Z79899 Other long term (current) drug therapy: Secondary | ICD-10-CM | POA: Diagnosis not present

## 2020-08-16 DIAGNOSIS — Z23 Encounter for immunization: Secondary | ICD-10-CM | POA: Diagnosis not present

## 2020-08-16 NOTE — Patient Instructions (Addendum)
After Visit Summary:  We will be checking the following labs today - BMET, CBC, HPF and TSH   Medication Instructions:    Continue with your current medicines.    If you need a refill on your cardiac medications before your next appointment, please call your pharmacy.     Testing/Procedures To Be Arranged:  N/A  Follow-Up:   See me in February with EKG  Talk to Dr. Quay Burow about your left breast.     At [facility], you and your health needs are our priority.  As part of our continuing mission to provide you with exceptional heart care, we have created designated Provider Care Teams.  These Care Teams include your primary Cardiologist (physician) and Advanced Practice Providers (APPs -  Physician Assistants and Nurse Practitioners) who all work together to provide you with the care you need, when you need it.  Special Instructions:  . Stay safe, wash your hands for at least 20 seconds and wear a mask when needed.  . It was good to talk with you today.    Call the [place] office at [phone] if you have any questions, problems or concerns.

## 2020-08-17 DIAGNOSIS — H25812 Combined forms of age-related cataract, left eye: Secondary | ICD-10-CM | POA: Diagnosis not present

## 2020-08-17 DIAGNOSIS — H25012 Cortical age-related cataract, left eye: Secondary | ICD-10-CM | POA: Diagnosis not present

## 2020-08-17 DIAGNOSIS — H2512 Age-related nuclear cataract, left eye: Secondary | ICD-10-CM | POA: Diagnosis not present

## 2020-08-17 LAB — HEPATIC FUNCTION PANEL
ALT: 15 IU/L (ref 0–32)
AST: 18 IU/L (ref 0–40)
Albumin: 4.1 g/dL (ref 3.6–4.6)
Alkaline Phosphatase: 47 IU/L (ref 44–121)
Bilirubin Total: 0.6 mg/dL (ref 0.0–1.2)
Bilirubin, Direct: 0.2 mg/dL (ref 0.00–0.40)
Total Protein: 6.2 g/dL (ref 6.0–8.5)

## 2020-08-17 LAB — CBC
Hematocrit: 42.5 % (ref 34.0–46.6)
Hemoglobin: 14.5 g/dL (ref 11.1–15.9)
MCH: 31.7 pg (ref 26.6–33.0)
MCHC: 34.1 g/dL (ref 31.5–35.7)
MCV: 93 fL (ref 79–97)
Platelets: 197 10*3/uL (ref 150–450)
RBC: 4.58 x10E6/uL (ref 3.77–5.28)
RDW: 12 % (ref 11.7–15.4)
WBC: 5.7 10*3/uL (ref 3.4–10.8)

## 2020-08-17 LAB — TSH: TSH: 2.69 u[IU]/mL (ref 0.450–4.500)

## 2020-08-17 LAB — BASIC METABOLIC PANEL
BUN/Creatinine Ratio: 16 (ref 12–28)
BUN: 14 mg/dL (ref 8–27)
CO2: 26 mmol/L (ref 20–29)
Calcium: 9.3 mg/dL (ref 8.7–10.3)
Chloride: 105 mmol/L (ref 96–106)
Creatinine, Ser: 0.9 mg/dL (ref 0.57–1.00)
GFR calc Af Amer: 67 mL/min/{1.73_m2} (ref >59)
GFR calc non Af Amer: 58 mL/min/{1.73_m2} — ABNORMAL LOW (ref >59)
Glucose: 87 mg/dL (ref 65–99)
Potassium: 4.8 mmol/L (ref 3.5–5.2)
Sodium: 143 mmol/L (ref 134–144)

## 2020-08-26 ENCOUNTER — Other Ambulatory Visit: Payer: Self-pay

## 2020-08-26 ENCOUNTER — Ambulatory Visit: Payer: Medicare Other | Admitting: Pharmacist

## 2020-08-26 DIAGNOSIS — I4819 Other persistent atrial fibrillation: Secondary | ICD-10-CM

## 2020-08-26 DIAGNOSIS — M810 Age-related osteoporosis without current pathological fracture: Secondary | ICD-10-CM

## 2020-08-26 DIAGNOSIS — E782 Mixed hyperlipidemia: Secondary | ICD-10-CM

## 2020-08-26 DIAGNOSIS — I10 Essential (primary) hypertension: Secondary | ICD-10-CM

## 2020-08-26 NOTE — Patient Instructions (Addendum)
Visit Information  Phone number for Pharmacist: [phone]  Thank you for meeting with me to discuss your medications! I look forward to working with you to achieve your health care goals. Below is a summary of what we talked about during the visit:  Goals Addressed            This Visit's Progress   . Pharmacy Care Plan       CARE PLAN ENTRY (see longitudinal plan of care for additional care plan information)  Current Barriers:  . Chronic Disease Management support, education, and care coordination needs related to Hypertension, Hyperlipidemia, Atrial Fibrillation, and Osteoporosis   Hypertension / AFib BP Readings from Last 3 Encounters:  08/16/20 130/80  04/19/20 130/80  03/17/20 (!) 164/82 .  Pharmacist Clinical Goal(s): o Over the next 180 days, patient will work with PharmD and providers to maintain BP goal <140/90 . Current regimen:  o Amiodarone 200 mg - 1/2 tab daily o Metoprolol succinate 25 mg daily o Eliquis 5 mg twice a day . Interventions: o Discussed BP goals and benefits of medications for prevention of heart attack / stroke o Discussed benefits of Eliquis and risk of bleeding; pt denies bleeding issues . Patient self care activities - Over the next 180 days, patient will: o Check BP 2-3 times weekly, document, and provide at future appointments o Avoid NSAIDs and monitor for bleeding  Hyperlipidemia Lab Results  Component Value Date/Time   LDLCALC 118 (H) 04/19/2020 02:45 PM .  Pharmacist Clinical Goal(s): o Over the next 180 days, patient will work with PharmD and providers to maintain LDL goal < 130 . Current regimen:  o No medications . Interventions: o Discussed cholesterol goals and benefits of diet/exercise for prevention of heart attack / stroke . Patient self care activities - Over the next 180 days, patient will: o Continue low cholesterol diet  Osteoporosis . Pharmacist Clinical Goal(s) o Over the next 180 days, patient will work with  PharmD and providers to optimize therapy . Current regimen:  o No medications . Interventions: o Advised 1200 mg of calcium and 800 units of Vitamin D per day . Patient self care activities - Over the next 180 days, patient will: o Take Calcium and Vitamin D supplement once daily  Medication management . Pharmacist Clinical Goal(s): o Over the next 180 days, patient will work with PharmD and providers to maintain optimal medication adherence . Current pharmacy: [facility] . Interventions o Comprehensive medication review performed. o Continue current medication management strategy . Patient self care activities - Over the next 180 days, patient will: o Focus on medication adherence by pill box o Take medications as prescribed o Report any questions or concerns to PharmD and/or provider(s)  Initial goal documentation      Molly Tucker was given information about Chronic Care Management services today including:  1. CCM service includes personalized support from designated clinical staff supervised by her physician, including individualized plan of care and coordination with other care providers 2. 24/7 contact phone numbers for assistance for urgent and routine care needs. 3. Standard insurance, coinsurance, copays and deductibles apply for chronic care management only during months in which we provide at least 20 minutes of these services. Most insurances cover these services at 100%, however patients may be responsible for any copay, coinsurance and/or deductible if applicable. This service may help you avoid the need for more expensive face-to-face services. 4. Only one practitioner may furnish and bill the service  in a calendar month. 5. The patient may stop CCM services at any time (effective at the end of the month) by phone call to the office staff.  Patient agreed to services and verbal consent obtained.   Print copy of patient instructions provided.  Telephone follow  up appointment with pharmacy team member scheduled for: 6 months  Charlene Brooke, PharmD, BCACP Clinical Pharmacist [facility] Maintenance After Age 37 After age 39, you are at a higher risk for certain long-term diseases and infections as well as injuries from falls. Falls are a major cause of broken bones and head injuries in people who are older than age 80. Getting regular preventive care can help to keep you healthy and well. Preventive care includes getting regular testing and making lifestyle changes as recommended by your health care provider. Talk with your health care provider about:  Which screenings and tests you should have. A screening is a test that checks for a disease when you have no symptoms.  A diet and exercise plan that is right for you. What should I know about screenings and tests to prevent falls? Screening and testing are the best ways to find a health problem early. Early diagnosis and treatment give you the best chance of managing medical conditions that are common after age 50. Certain conditions and lifestyle choices may make you more likely to have a fall. Your health care provider may recommend:  Regular vision checks. Poor vision and conditions such as cataracts can make you more likely to have a fall. If you wear glasses, make sure to get your prescription updated if your vision changes.  Medicine review. Work with your health care provider to regularly review all of the medicines you are taking, including over-the-counter medicines. Ask your health care provider about any side effects that may make you more likely to have a fall. Tell your health care provider if any medicines that you take make you feel dizzy or sleepy.  Osteoporosis screening. Osteoporosis is a condition that causes the bones to get weaker. This can make the bones weak and cause them to break more easily.  Blood pressure screening. Blood  pressure changes and medicines to control blood pressure can make you feel dizzy.  Strength and balance checks. Your health care provider may recommend certain tests to check your strength and balance while standing, walking, or changing positions.  Foot health exam. Foot pain and numbness, as well as not wearing proper footwear, can make you more likely to have a fall.  Depression screening. You may be more likely to have a fall if you have a fear of falling, feel emotionally low, or feel unable to do activities that you used to do.  Alcohol use screening. Using too much alcohol can affect your balance and may make you more likely to have a fall. What actions can I take to lower my risk of falls? General instructions  Talk with your health care provider about your risks for falling. Tell your health care provider if: ? You fall. Be sure to tell your health care provider about all falls, even ones that seem minor. ? You feel dizzy, sleepy, or off-balance.  Take over-the-counter and prescription medicines only as told by your health care provider. These include any supplements.  Eat a healthy diet and maintain a healthy weight. A healthy diet includes low-fat dairy products, low-fat (lean) meats, and fiber from whole grains, beans, and lots of fruits  and vegetables. Home safety  Remove any tripping hazards, such as rugs, cords, and clutter.  Install safety equipment such as grab bars in bathrooms and safety rails on stairs.  Keep rooms and walkways well-lit. Activity   Follow a regular exercise program to stay fit. This will help you maintain your balance. Ask your health care provider what types of exercise are appropriate for you.  If you need a cane or walker, use it as recommended by your health care provider.  Wear supportive shoes that have nonskid soles. Lifestyle  Do not drink alcohol if your health care provider tells you not to drink.  If you drink alcohol, limit how  much you have: ? 0-1 drink a day for women. ? 0-2 drinks a day for men.  Be aware of how much alcohol is in your drink. In the [place] one drink equals one typical bottle of beer (12 oz), one-half glass of wine (5 oz), or one shot of hard liquor (1 oz).  Do not use any products that contain nicotine or tobacco, such as cigarettes and e-cigarettes. If you need help quitting, ask your health care provider. Summary  Having a healthy lifestyle and getting preventive care can help to protect your health and wellness after age 82.  Screening and testing are the best way to find a health problem early and help you avoid having a fall. Early diagnosis and treatment give you the best chance for managing medical conditions that are more common for people who are older than age 25.  Falls are a major cause of broken bones and head injuries in people who are older than age 51. Take precautions to prevent a fall at home.  Work with your health care provider to learn what changes you can make to improve your health and wellness and to prevent falls. This information is not intended to replace advice given to you by your health care provider. Make sure you discuss any questions you have with your health care provider. Document Revised: 02/06/2019 Document Reviewed: 08/29/2017 Elsevier Patient Education  2020 [employer].

## 2020-08-26 NOTE — Chronic Care Management (AMB) (Signed)
Chronic Care Management Pharmacy  Name: Molly Tucker  MRN: [id number] DOB: 08/04/1932   Chief Complaint/ HPI  Molly Tucker,  86 y.o. , female presents for their Initial CCM visit with the clinical pharmacist via telephone due to COVID-19 Pandemic.  PCP : Binnie Rail, MD Patient Care Team: Binnie Rail, MD as PCP - General (Internal Medicine) Charlton Haws, [facility] as Pharmacist (Pharmacist)  Their chronic conditions include: Hypertension, Hyperlipidemia, Atrial Fibrillation, Osteoporosis and Osteoarthritis   Patient lives with husband. She has 2 children, 3 grandchild and 1 great-grandchild just recently born.   Office Visits: 09/09/2019 Dr Quay Burow OV: chronic f/u. Caregiver stress (for husband). Frequent urination, tolerating for now. No med changes.  Consult Visit: 08/16/20 NP [name] (cardiology): f/u for Afib, stable, no med changes. Gave flu shot.  No Known Allergies  Medications: Outpatient Encounter Medications as of 08/26/2020  Medication Sig  . acetaminophen (TYLENOL) 500 MG tablet Take 500 mg by mouth daily as needed (arthritis pain).   Marland Kitchen amiodarone (PACERONE) 200 MG tablet Take 0.5 tablets (100 mg total) by mouth daily.  Marland Kitchen ELIQUIS 5 MG TABS tablet TAKE 1 TABLET BY MOUTH  TWICE DAILY  . metoprolol succinate (TOPROL-XL) 25 MG 24 hr tablet Take 1 tablet (25 mg total) by mouth daily.  . furosemide (LASIX) 40 MG tablet Take 0.5 tablets (20 mg total) by mouth daily as needed for fluid. (Patient not taking: Reported on 08/26/2020)  . moxifloxacin (VIGAMOX) 0.5 % ophthalmic solution Place into the left eye.  . prednisoLONE acetate (PRED FORTE) 1 % ophthalmic suspension Place 1 drop into the right eye 4 (four) times daily.   No facility-administered encounter medications on file as of 08/26/2020.    Wt Readings from Last 3 Encounters:  08/16/20 137 lb 9.6 oz (62.4 kg)  04/19/20 136 lb 12.8 oz (62.1 kg)  03/17/20 138 lb 9.6 oz (62.9 kg)    Current  Diagnosis/Assessment:  SDOH Interventions     Most Recent Value  SDOH Interventions  Financial Strain Interventions Intervention Not Indicated      Goals Addressed            This Visit's Progress   . Pharmacy Care Plan       CARE PLAN ENTRY (see longitudinal plan of care for additional care plan information)  Current Barriers:  . Chronic Disease Management support, education, and care coordination needs related to Hypertension, Hyperlipidemia, Atrial Fibrillation, and Osteoporosis   Hypertension / AFib BP Readings from Last 3 Encounters:  08/16/20 130/80  04/19/20 130/80  03/17/20 (!) 164/82 .  Pharmacist Clinical Goal(s): o Over the next 180 days, patient will work with PharmD and providers to maintain BP goal <140/90 . Current regimen:  o Amiodarone 200 mg - 1/2 tab daily o Metoprolol succinate 25 mg daily o Eliquis 5 mg twice a day . Interventions: o Discussed BP goals and benefits of medications for prevention of heart attack / stroke o Discussed benefits of Eliquis and risk of bleeding; pt denies bleeding issues . Patient self care activities - Over the next 180 days, patient will: o Check BP 2-3 times weekly, document, and provide at future appointments o Avoid NSAIDs and monitor for bleeding  Hyperlipidemia Lab Results  Component Value Date/Time   LDLCALC 118 (H) 04/19/2020 02:45 PM .  Pharmacist Clinical Goal(s): o Over the next 180 days, patient will work with PharmD and providers to maintain LDL goal < 130 . Current regimen:  o No  medications . Interventions: o Discussed cholesterol goals and benefits of diet/exercise for prevention of heart attack / stroke . Patient self care activities - Over the next 180 days, patient will: o Continue low cholesterol diet  Osteoporosis . Pharmacist Clinical Goal(s) o Over the next 180 days, patient will work with PharmD and providers to optimize therapy . Current regimen:  o No  medications . Interventions: o Advised 1200 mg of calcium and 800 units of Vitamin D per day . Patient self care activities - Over the next 180 days, patient will: o Take Calcium and Vitamin D supplement once daily  Medication management . Pharmacist Clinical Goal(s): o Over the next 180 days, patient will work with PharmD and providers to maintain optimal medication adherence . Current pharmacy: [facility] . Interventions o Comprehensive medication review performed. o Continue current medication management strategy . Patient self care activities - Over the next 180 days, patient will: o Focus on medication adherence by pill box o Take medications as prescribed o Report any questions or concerns to PharmD and/or provider(s)  Initial goal documentation        AFIB   Cardioverted in 03/2015, 12/2015, 12/2016, 07/2019, 3/20201, 01/2020  Patient is currently rhythm controlled. Office heart rates are  Pulse Readings from Last 3 Encounters:  08/16/20 60  04/19/20 61  03/17/20 (!) 56   CHA2DS2-VASc Score = 4  The patient's score is based upon: CHF History: 0 HTN History: 1 Diabetes History: 0 Stroke History: 0 Vascular Disease History: 0 Age Score: 2 Gender Score: 1  BP goal is:  <140/90  Office blood pressures are  BP Readings from Last 3 Encounters:  08/16/20 130/80  04/19/20 130/80  03/17/20 (!) 164/82   Lab Results  Component Value Date   CREATININE 0.90 08/16/2020   BUN 14 08/16/2020   GFR 74.92 04/06/2017   GFRNONAA 58 (L) 08/16/2020   GFRAA 67 08/16/2020   NA 143 08/16/2020   K 4.8 08/16/2020   CALCIUM 9.3 08/16/2020   CO2 26 08/16/2020   Patient checks BP at home daily Patient home BP readings are ranging: "normal"  Patient has failed these meds in past: n/a Patient is currently controlled on the following medications:  . Amiodarone 200 mg - 1/2 tab daily . Metoprolol succinate 25 mg daily . Furosemide 40 mg - 1/2 tab PRN  - not  taking . Eliquis 5 mg BID  We discussed:  diet and exercise extensively; benefits of medications; risk of bleeding with Eliquis; pt denies bleeding issues; Eliquis dose is appropriate based on Age > 80, wt > 60 kg, Scr < 1.5  Plan  Continue current medications   Hyperlipidemia   LDL goal < 130  Last lipids Lab Results  Component Value Date   CHOL 195 04/19/2020   HDL 59 04/19/2020   LDLCALC 118 (H) 04/19/2020   TRIG 102 04/19/2020   CHOLHDL 3.3 04/19/2020   Hepatic Function Latest Ref Rng & Units 08/16/2020 04/19/2020 01/29/2020  Total Protein 6.0 - 8.5 g/dL 6.2 6.0 6.2(L)  Albumin 3.6 - 4.6 g/dL 4.1 4.0 3.7  AST 0 - 40 IU/L _0 ALT 0 - 32 IU/L _1 Alk Phosphatase 44 - 121 IU/L 47 51 37(L)  Total Bilirubin 0.0 - 1.2 mg/dL 0.6 0.5 1.3(H)  Bilirubin, Direct 0.00 - 0.40 mg/dL 0.20 0.17 -     The ASCVD Risk score Mikey Bussing DC Jr., et al., 2013) failed to calculate for  the following reasons:   The 2013 ASCVD risk score is only valid for ages 73 to 1   Patient has failed these meds in past: pravastatin Patient is currently controlled on the following medications:  . No medications  We discussed:  diet and exercise extensively  Plan  Continue control with diet and exercise   Osteoporosis   Last DEXA Scan: 11/1998  Last vitamin D/Calcium Lab Results  Component Value Date   VD25OH 46 08/22/2011   CALCIUM 9.3 08/16/2020   Patient has failed these meds in past: alendronate (years unknown) Patient is currently controlled on the following medications:  . No medications  We discussed:  Recommend [phone] units of vitamin D daily. Recommend 1200 mg of calcium daily from dietary and supplemental sources.  DEXA declined per patient - she does not want further testing.  Plan  Continue current medications and control with diet and exercise  Osteoarthritis   Patient has failed these meds in past: n/a Patient is currently controlled on the following medications:   . Tylenol 500 mg PRN  We discussed:  Patient is satisfied with current regimen and denies issues  Plan  Continue current medications  Medication Management   [facility] Uses pill box? Yes Pt endorses 100% compliance  We discussed: Current pharmacy is preferred with insurance plan and patient is satisfied with pharmacy services  Plan  Continue current medication management strategy    Follow up: 6 month phone visit  Charlene Brooke, PharmD, BCACP Clinical Pharmacist [facility] [phone]

## 2020-09-09 NOTE — Progress Notes (Signed)
Subjective:    Patient ID: Molly Tucker, female    DOB: [birth year], 86 y.o.   MRN: [id number]   This visit occurred during the SARS-CoV-2 public health emergency.  Safety protocols were in place, including screening questions prior to the visit, additional usage of staff PPE, and extensive cleaning of exam room while observing appropriate contact time as indicated for disinfecting solutions.    HPI She is here for a physical exam.    She has a lump - possible cyst near her left nipple.  She states this has been there for years, but it has gotten worse.  Her last mammogram was years ago.     S/p cataract surgery b/l.   Medications and allergies reviewed with patient and updated if appropriate.  Patient Active Problem List   Diagnosis Date Noted  . Prolapse of female bladder, acquired 09/09/2019  . Leg cramps 04/06/2017  . Osteoarthritis, hand 02/01/2016  . Persistent atrial fibrillation ([place] [facility])   . COLONIC POLYPS, HX OF 12/15/2008  . HYPERLIPIDEMIA 11/21/2007  . HTN (hypertension) 11/21/2007  . Osteoporosis 09/13/2007    Current Outpatient Medications on File Prior to Visit  Medication Sig Dispense Refill  . acetaminophen (TYLENOL) 500 MG tablet Take 500 mg by mouth daily as needed (arthritis pain).     Marland Kitchen amiodarone (PACERONE) 200 MG tablet Take 0.5 tablets (100 mg total) by mouth daily. 45 tablet 3  . ELIQUIS 5 MG TABS tablet TAKE 1 TABLET BY MOUTH  TWICE DAILY 180 tablet 1  . metoprolol succinate (TOPROL-XL) 25 MG 24 hr tablet Take 1 tablet (25 mg total) by mouth daily. 90 tablet 3  . moxifloxacin (VIGAMOX) 0.5 % ophthalmic solution Place into the left eye.    . prednisoLONE acetate (PRED FORTE) 1 % ophthalmic suspension Place 1 drop into the right eye 4 (four) times daily.    . furosemide (LASIX) 40 MG tablet Take 0.5 tablets (20 mg total) by mouth daily as needed for fluid. (Patient not taking: Reported on 08/26/2020) 30 tablet    No current facility-administered  medications on file prior to visit.    Past Medical History:  Diagnosis Date  . Atrial tachycardia ([place])   . Carotid bruit 2005   right, carotid doppler: <39% occlusion  . Hyperlipidemia    LDL goal =<120  . Hyperplastic colonic polyp 2006   Dr. Carlean Purl, due 2016  . Hypertension   . Osteoporosis    S/P  biphosphonates x 5 yrs  . Persistent atrial fibrillation [facility])     Past Surgical History:  Procedure Laterality Date  . CARDIOVERSION N/A 03/31/2015   Procedure: CARDIOVERSION;  Surgeon: Sanda Klein, MD;  Location: [place];  Service: Cardiovascular;  Laterality: N/A;  . CARDIOVERSION N/A 12/21/2015   Procedure: CARDIOVERSION;  Surgeon: Lelon Perla, MD;  Location: [facility] ENDOSCOPY;  Service: Cardiovascular;  Laterality: N/A;  . CARDIOVERSION N/A 01/09/2017   Procedure: CARDIOVERSION;  Surgeon: Jerline Pain, MD;  Location: [place];  Service: Cardiovascular;  Laterality: N/A;  . CARDIOVERSION N/A 07/09/2019   Procedure: CARDIOVERSION;  Surgeon: Sanda Klein, MD;  Location: [place] ENDOSCOPY;  Service: Cardiovascular;  Laterality: N/A;  . CARDIOVERSION N/A 01/14/2020   Procedure: CARDIOVERSION;  Surgeon: Geralynn Rile, MD;  Location: [place];  Service: Cardiovascular;  Laterality: N/A;  . CARDIOVERSION N/A 02/23/2020   Procedure: CARDIOVERSION;  Surgeon: Jerline Pain, MD;  Location: [facility] ENDOSCOPY;  Service: Cardiovascular;  Laterality: N/A;  . COLONOSCOPY W/ POLYPECTOMY  2006  .  DILATION AND CURETTAGE OF UTERUS    . MOUTH SURGERY  2009-2010   implants, Dr. Marcelyn Ditty    Social History   Socioeconomic History  . Marital status: Married    Spouse name: Not on file  . Number of children: Not on file  . Years of education: Not on file  . Highest education level: Not on file  Occupational History  . Occupation: Retired    Fish farm manager: [employer]  . Smoking status: Former Smoker    Quit date: 10/30/1976    Years since quitting: 43.8  . Smokeless  tobacco: Never Used  . Tobacco comment: Patient would ONLY smoke occasionally   Substance and Sexual Activity  . Alcohol use: No  . Drug use: No  . Sexual activity: Not on file  Other Topics Concern  . Not on file  Social History Narrative   Regular exercise: yes: walking 30 minute once daily   Social Determinants of Health   Financial Resource Strain: Low Risk   . Difficulty of Paying Living Expenses: Not hard at all  Food Insecurity:   . Worried About Charity fundraiser in the Last Year: Not on file  . Ran Out of Food in the Last Year: Not on file  Transportation Needs:   . Lack of Transportation (Medical): Not on file  . Lack of Transportation (Non-Medical): Not on file  Physical Activity:   . Days of Exercise per Week: Not on file  . Minutes of Exercise per Session: Not on file  Stress:   . Feeling of Stress : Not on file  Social Connections:   . Frequency of Communication with Friends and Family: Not on file  . Frequency of Social Gatherings with Friends and Family: Not on file  . Attends Religious Services: Not on file  . Active Member of Clubs or Organizations: Not on file  . Attends Archivist Meetings: Not on file  . Marital Status: Not on file    Family History  Problem Relation Age of Onset  . Coronary artery disease Father        MI in 19s  . Cancer Sister        breast  . Coronary artery disease Sister        stent 2009  . Cancer Paternal Aunt        colon, possible breast cancer  . Arthritis Other        aunts  . Stroke Neg Hx     Review of Systems  Constitutional: Negative for chills, fatigue and fever.  Eyes: Negative for visual disturbance.  Respiratory: Negative for cough, shortness of breath and wheezing.   Cardiovascular: Negative for chest pain, palpitations and leg swelling.  Gastrointestinal: Negative for abdominal pain, blood in stool, constipation, diarrhea and nausea.       No gerd  Genitourinary: Positive for frequency  (nocturia x 3). Negative for dysuria and hematuria.  Musculoskeletal: Positive for back pain (occ). Negative for arthralgias.  Skin: Negative for rash.  Neurological: Negative for dizziness, light-headedness and headaches.  Psychiatric/Behavioral: Negative for dysphoric mood. The patient is not nervous/anxious.        Objective:   Vitals:   09/10/20 1504  BP: 128/80  Pulse: 60  Temp: 98 F (36.7 C)  SpO2: 95%   Filed Weights   09/10/20 1504  Weight: 136 lb 6.4 oz (61.9 kg)   Body mass index is 23.41 kg/m.  BP Readings from Last 3 Encounters:  09/10/20 128/80  08/16/20 130/80  04/19/20 130/80    Wt Readings from Last 3 Encounters:  09/10/20 136 lb 6.4 oz (61.9 kg)  08/16/20 137 lb 9.6 oz (62.4 kg)  04/19/20 136 lb 12.8 oz (62.1 kg)     Physical Exam Constitutional: She appears well-developed and well-nourished. No distress.  HENT:  Head: Normocephalic and atraumatic.  Right Ear: External ear normal. Normal ear canal and TM Left Ear: External ear normal.  Normal ear canal and TM Mouth/Throat: Oropharynx is clear and moist.  Eyes: Conjunctivae and EOM are normal.  Neck: Neck supple. No tracheal deviation present. No thyromegaly present.  No carotid bruit  Cardiovascular: Normal rate, regular rhythm and normal heart sounds.   No murmur heard.  No edema. Pulmonary/Chest: Effort normal and breath sounds normal. No respiratory distress. She has no wheezes. She has no rales.  Breast: left breast walnut sized lump in areola at 9 o'clock Abdominal: Soft. She exhibits no distension. There is no tenderness.  Lymphadenopathy: She has no cervical adenopathy.  Skin: Skin is warm and dry. She is not diaphoretic.  Psychiatric: She has a normal mood and affect. Her behavior is normal.        Assessment & Plan:   Physical exam: Screening blood work    deferred Immunizations  Discussed covid booster.   Colonoscopy   - aged out Mammogram  - aged out, but given recent lump  - diag mammo ordered Gyn  n/a Dexa  deferred Eye exams  Up to date  Exercise  Active around house Weight  normal Substance abuse  none      See Problem List for Assessment and Plan of chronic medical problems.

## 2020-09-09 NOTE — Patient Instructions (Addendum)
All other Health Maintenance issues reviewed.   All recommended immunizations and age-appropriate screenings are up-to-date or discussed.  No immunization administered today.   Medications reviewed and updated.  Changes include : none     A mammogram was ordered.   Please followup in 1 year     Health Maintenance, Female Adopting a healthy lifestyle and getting preventive care are important in promoting health and wellness. Ask your health care provider about:  The right schedule for you to have regular tests and exams.  Things you can do on your own to prevent diseases and keep yourself healthy. What should I know about diet, weight, and exercise? Eat a healthy diet   Eat a diet that includes plenty of vegetables, fruits, low-fat dairy products, and lean protein.  Do not eat a lot of foods that are high in solid fats, added sugars, or sodium. Maintain a healthy weight Body mass index (BMI) is used to identify weight problems. It estimates body fat based on height and weight. Your health care provider can help determine your BMI and help you achieve or maintain a healthy weight. Get regular exercise Get regular exercise. This is one of the most important things you can do for your health. Most adults should:  Exercise for at least 150 minutes each week. The exercise should increase your heart rate and make you sweat (moderate-intensity exercise).  Do strengthening exercises at least twice a week. This is in addition to the moderate-intensity exercise.  Spend less time sitting. Even light physical activity can be beneficial. Watch cholesterol and blood lipids Have your blood tested for lipids and cholesterol at 84 years of age, then have this test every 5 years. Have your cholesterol levels checked more often if:  Your lipid or cholesterol levels are high.  You are older than 84 years of age.  You are at high risk for heart disease. What should I know about cancer  screening? Depending on your health history and family history, you may need to have cancer screening at various ages. This may include screening for:  Breast cancer.  Cervical cancer.  Colorectal cancer.  Skin cancer.  Lung cancer. What should I know about heart disease, diabetes, and high blood pressure? Blood pressure and heart disease  High blood pressure causes heart disease and increases the risk of stroke. This is more likely to develop in people who have high blood pressure readings, are of African descent, or are overweight.  Have your blood pressure checked: ? Every 3-5 years if you are 15-56 years of age. ? Every year if you are 57 years old or older. Diabetes Have regular diabetes screenings. This checks your fasting blood sugar level. Have the screening done:  Once every three years after age 25 if you are at a normal weight and have a low risk for diabetes.  More often and at a younger age if you are overweight or have a high risk for diabetes. What should I know about preventing infection? Hepatitis B If you have a higher risk for hepatitis B, you should be screened for this virus. Talk with your health care provider to find out if you are at risk for hepatitis B infection. Hepatitis C Testing is recommended for:  Everyone born from [birth year] through 1963.  Anyone with known risk factors for hepatitis C. Sexually transmitted infections (STIs)  Get screened for STIs, including gonorrhea and chlamydia, if: ? You are sexually active and are younger than 84 years of  age. ? You are older than 84 years of age and your health care provider tells you that you are at risk for this type of infection. ? Your sexual activity has changed since you were last screened, and you are at increased risk for chlamydia or gonorrhea. Ask your health care provider if you are at risk.  Ask your health care provider about whether you are at high risk for HIV. Your health care provider may  recommend a prescription medicine to help prevent HIV infection. If you choose to take medicine to prevent HIV, you should first get tested for HIV. You should then be tested every 3 months for as long as you are taking the medicine. Pregnancy  If you are about to stop having your period (premenopausal) and you may become pregnant, seek counseling before you get pregnant.  Take 400 to 800 micrograms (mcg) of folic acid every day if you become pregnant.  Ask for birth control (contraception) if you want to prevent pregnancy. Osteoporosis and menopause Osteoporosis is a disease in which the bones lose minerals and strength with aging. This can result in bone fractures. If you are 51 years old or older, or if you are at risk for osteoporosis and fractures, ask your health care provider if you should:  Be screened for bone loss.  Take a calcium or vitamin D supplement to lower your risk of fractures.  Be given hormone replacement therapy (HRT) to treat symptoms of menopause. Follow these instructions at home: Lifestyle  Do not use any products that contain nicotine or tobacco, such as cigarettes, e-cigarettes, and chewing tobacco. If you need help quitting, ask your health care provider.  Do not use street drugs.  Do not share needles.  Ask your health care provider for help if you need support or information about quitting drugs. Alcohol use  Do not drink alcohol if: ? Your health care provider tells you not to drink. ? You are pregnant, may be pregnant, or are planning to become pregnant.  If you drink alcohol: ? Limit how much you use to 0-1 drink a day. ? Limit intake if you are breastfeeding.  Be aware of how much alcohol is in your drink. In the [place] one drink equals one 12 oz bottle of beer (355 mL), one 5 oz glass of wine (148 mL), or one 1 oz glass of hard liquor (44 mL). General instructions  Schedule regular health, dental, and eye exams.  Stay current with your  vaccines.  Tell your health care provider if: ? You often feel depressed. ? You have ever been abused or do not feel safe at home. Summary  Adopting a healthy lifestyle and getting preventive care are important in promoting health and wellness.  Follow your health care provider's instructions about healthy diet, exercising, and getting tested or screened for diseases.  Follow your health care provider's instructions on monitoring your cholesterol and blood pressure. This information is not intended to replace advice given to you by your health care provider. Make sure you discuss any questions you have with your health care provider. Document Revised: 10/09/2018 Document Reviewed: 10/09/2018 Elsevier Patient Education  2020 [employer].

## 2020-09-10 ENCOUNTER — Encounter: Payer: Self-pay | Admitting: Internal Medicine

## 2020-09-10 ENCOUNTER — Ambulatory Visit (INDEPENDENT_AMBULATORY_CARE_PROVIDER_SITE_OTHER): Payer: Medicare Other | Admitting: Internal Medicine

## 2020-09-10 ENCOUNTER — Ambulatory Visit (INDEPENDENT_AMBULATORY_CARE_PROVIDER_SITE_OTHER): Payer: Medicare Other

## 2020-09-10 ENCOUNTER — Other Ambulatory Visit: Payer: Self-pay

## 2020-09-10 VITALS — BP 128/80 | HR 60 | Temp 98.0°F | Ht 64.0 in | Wt 136.4 lb

## 2020-09-10 VITALS — BP 128/80 | HR 60 | Temp 98.0°F | Ht 64.0 in | Wt 136.5 lb

## 2020-09-10 DIAGNOSIS — I4819 Other persistent atrial fibrillation: Secondary | ICD-10-CM

## 2020-09-10 DIAGNOSIS — Z0000 Encounter for general adult medical examination without abnormal findings: Secondary | ICD-10-CM | POA: Diagnosis not present

## 2020-09-10 DIAGNOSIS — I10 Essential (primary) hypertension: Secondary | ICD-10-CM

## 2020-09-10 DIAGNOSIS — M810 Age-related osteoporosis without current pathological fracture: Secondary | ICD-10-CM

## 2020-09-10 DIAGNOSIS — N6325 Unspecified lump in the left breast, overlapping quadrants: Secondary | ICD-10-CM | POA: Insufficient documentation

## 2020-09-10 NOTE — Assessment & Plan Note (Signed)
Acute Diagnostic mammogram and [place] of left breast prn ordered -- she would like to wait until Jan to have this done

## 2020-09-10 NOTE — Assessment & Plan Note (Signed)
Chronic Recently had cardioversion x 2 In sinus here Management per cardio On amiodarone, metoprolol, eliquis

## 2020-09-10 NOTE — Assessment & Plan Note (Signed)
Chronic Deferred dexa Very acttive

## 2020-09-10 NOTE — Assessment & Plan Note (Signed)
Chronic BP well controlled Continue metoprolol 25 mg daily

## 2020-09-10 NOTE — Patient Instructions (Signed)
Molly Tucker , Thank you for taking time to come for your Medicare Wellness Visit. I appreciate your ongoing commitment to your health goals. Please review the following plan we discussed and let me know if I can assist you in the future.   Screening recommendations/referrals: Colonoscopy: no repeat due to age 86: never done Bone Density: never done Recommended yearly ophthalmology/optometry visit for glaucoma screening and checkup Recommended yearly dental visit for hygiene and checkup  Vaccinations: Influenza vaccine: 08/16/2020 Pneumococcal vaccine: need Pneumovax23 Tdap vaccine: 08/22/2011; due every 10 years Shingles vaccine: never done   Covid-19: up to date  Advanced directives: Please bring a copy of your health care power of attorney and living will to the office at your convenience.  Conditions/risks identified: Yes; Reviewed health maintenance screenings with patient today and relevant education, vaccines, and/or referrals were provided. Please continue to do your personal lifestyle choices by: daily care of teeth and gums, regular physical activity (goal should be 5 days a week for 30 minutes), eat a healthy diet, avoid tobacco and drug use, limiting any alcohol intake, taking a low-dose aspirin (if not allergic or have been advised by your provider otherwise) and taking vitamins and minerals as recommended by your provider. Continue doing brain stimulating activities (puzzles, reading, adult coloring books, staying active) to keep memory sharp. Continue to eat heart healthy diet (full of fruits, vegetables, whole grains, lean protein, water--limit salt, fat, and sugar intake) and increase physical activity as tolerated.  Next appointment: Please schedule your next Medicare Wellness Visit with your Nurse Health Advisor in 1 year by calling [phone].   Preventive Care 84 Years and Older, Female Preventive care refers to lifestyle choices and visits with your health care  provider that can promote health and wellness. What does preventive care include?  A yearly physical exam. This is also called an annual well check.  Dental exams once or twice a year.  Routine eye exams. Ask your health care provider how often you should have your eyes checked.  Personal lifestyle choices, including:  Daily care of your teeth and gums.  Regular physical activity.  Eating a healthy diet.  Avoiding tobacco and drug use.  Limiting alcohol use.  Practicing safe sex.  Taking low-dose aspirin every day.  Taking vitamin and mineral supplements as recommended by your health care provider. What happens during an annual well check? The services and screenings done by your health care provider during your annual well check will depend on your age, overall health, lifestyle risk factors, and family history of disease. Counseling  Your health care provider may ask you questions about your:  Alcohol use.  Tobacco use.  Drug use.  Emotional well-being.  Home and relationship well-being.  Sexual activity.  Eating habits.  History of falls.  Memory and ability to understand (cognition).  Work and work Statistician.  Reproductive health. Screening  You may have the following tests or measurements:  Height, weight, and BMI.  Blood pressure.  Lipid and cholesterol levels. These may be checked every 5 years, or more frequently if you are over 78 years old.  Skin check.  Lung cancer screening. You may have this screening every year starting at age 58 if you have a 30-pack-year history of smoking and currently smoke or have quit within the past 15 years.  Fecal occult blood test (FOBT) of the stool. You may have this test every year starting at age 1.  Flexible sigmoidoscopy or colonoscopy. You may have a sigmoidoscopy every  5 years or a colonoscopy every 10 years starting at age 80.  Hepatitis C blood test.  Hepatitis B blood test.  Sexually  transmitted disease (STD) testing.  Diabetes screening. This is done by checking your blood sugar (glucose) after you have not eaten for a while (fasting). You may have this done every 1-3 years.  Bone density scan. This is done to screen for osteoporosis. You may have this done starting at age 64.  Mammogram. This may be done every 1-2 years. Talk to your health care provider about how often you should have regular mammograms. Talk with your health care provider about your test results, treatment options, and if necessary, the need for more tests. Vaccines  Your health care provider may recommend certain vaccines, such as:  Influenza vaccine. This is recommended every year.  Tetanus, diphtheria, and acellular pertussis (Tdap, Td) vaccine. You may need a Td booster every 10 years.  Zoster vaccine. You may need this after age 12.  Pneumococcal 13-valent conjugate (PCV13) vaccine. One dose is recommended after age 88.  Pneumococcal polysaccharide (PPSV23) vaccine. One dose is recommended after age 50. Talk to your health care provider about which screenings and vaccines you need and how often you need them. This information is not intended to replace advice given to you by your health care provider. Make sure you discuss any questions you have with your health care provider. Document Released: 11/12/2015 Document Revised: 07/05/2016 Document Reviewed: 08/17/2015 Elsevier Interactive Patient Education  2017 [place] Prevention in the Home Falls can cause injuries. They can happen to people of all ages. There are many things you can do to make your home safe and to help prevent falls. What can I do on the outside of my home?  Regularly fix the edges of walkways and driveways and fix any cracks.  Remove anything that might make you trip as you walk through a door, such as a raised step or threshold.  Trim any bushes or trees on the path to your home.  Use bright outdoor  lighting.  Clear any walking paths of anything that might make someone trip, such as rocks or tools.  Regularly check to see if handrails are loose or broken. Make sure that both sides of any steps have handrails.  Any raised decks and porches should have guardrails on the edges.  Have any leaves, snow, or ice cleared regularly.  Use sand or salt on walking paths during winter.  Clean up any spills in your garage right away. This includes oil or grease spills. What can I do in the bathroom?  Use night lights.  Install grab bars by the toilet and in the tub and shower. Do not use towel bars as grab bars.  Use non-skid mats or decals in the tub or shower.  If you need to sit down in the shower, use a plastic, non-slip stool.  Keep the floor dry. Clean up any water that spills on the floor as soon as it happens.  Remove soap buildup in the tub or shower regularly.  Attach bath mats securely with double-sided non-slip rug tape.  Do not have throw rugs and other things on the floor that can make you trip. What can I do in the bedroom?  Use night lights.  Make sure that you have a light by your bed that is easy to reach.  Do not use any sheets or blankets that are too big for your bed. They should not  hang down onto the floor.  Have a firm chair that has side arms. You can use this for support while you get dressed.  Do not have throw rugs and other things on the floor that can make you trip. What can I do in the kitchen?  Clean up any spills right away.  Avoid walking on wet floors.  Keep items that you use a lot in easy-to-reach places.  If you need to reach something above you, use a strong step stool that has a grab bar.  Keep electrical cords out of the way.  Do not use floor polish or wax that makes floors slippery. If you must use wax, use non-skid floor wax.  Do not have throw rugs and other things on the floor that can make you trip. What can I do with my  stairs?  Do not leave any items on the stairs.  Make sure that there are handrails on both sides of the stairs and use them. Fix handrails that are broken or loose. Make sure that handrails are as long as the stairways.  Check any carpeting to make sure that it is firmly attached to the stairs. Fix any carpet that is loose or worn.  Avoid having throw rugs at the top or bottom of the stairs. If you do have throw rugs, attach them to the floor with carpet tape.  Make sure that you have a light switch at the top of the stairs and the bottom of the stairs. If you do not have them, ask someone to add them for you. What else can I do to help prevent falls?  Wear shoes that:  Do not have high heels.  Have rubber bottoms.  Are comfortable and fit you well.  Are closed at the toe. Do not wear sandals.  If you use a stepladder:  Make sure that it is fully opened. Do not climb a closed stepladder.  Make sure that both sides of the stepladder are locked into place.  Ask someone to hold it for you, if possible.  Clearly mark and make sure that you can see:  Any grab bars or handrails.  First and last steps.  Where the edge of each step is.  Use tools that help you move around (mobility aids) if they are needed. These include:  Canes.  Walkers.  Scooters.  Crutches.  Turn on the lights when you go into a dark area. Replace any light bulbs as soon as they burn out.  Set up your furniture so you have a clear path. Avoid moving your furniture around.  If any of your floors are uneven, fix them.  If there are any pets around you, be aware of where they are.  Review your medicines with your doctor. Some medicines can make you feel dizzy. This can increase your chance of falling. Ask your doctor what other things that you can do to help prevent falls. This information is not intended to replace advice given to you by your health care provider. Make sure you discuss any  questions you have with your health care provider. Document Released: 08/12/2009 Document Revised: 03/23/2016 Document Reviewed: 11/20/2014 Elsevier Interactive Patient Education  2017 [employer].

## 2020-09-10 NOTE — Progress Notes (Signed)
Subjective:   Molly Tucker is a 86 y.o. female who presents for Medicare Annual (Subsequent) preventive examination.  Review of Systems    No ROS. Medicare Wellness Visit. Additional risk factors are reflected in social history. Cardiac Risk Factors include: advanced age (>71men, >57 women);dyslipidemia;family history of premature cardiovascular disease;hypertension Sleep Patterns: No sleep issues, feels rested on waking and sleeps 6-8 hours nightly. Home Safety/Smoke Alarms: Feels safe in home; uses home alarm. Smoke alarms in place. Living environment: 2-story home; Lives with spouse; no needs for DME; good support system. Seat Belt Safety/Bike Helmet: Wears seat belt.    Objective:    Today's Vitals   09/10/20 1513  BP: 128/80  Pulse: 60  Temp: 98 F (36.7 C)  SpO2: 95%  Weight: 136 lb 7.4 oz (61.9 kg)  Height: 5\' 4"  (1.626 m)   Body mass index is 23.42 kg/m.  Advanced Directives 09/10/2020 02/23/2020 01/14/2020 07/09/2019 01/09/2017 12/21/2015 03/31/2015  Does Patient Have a Medical Advance Directive? Yes Yes Yes Yes Yes Yes Yes  Type of Advance Directive - [place];Living will [place];Living will Living will;Healthcare Power of Attorney Living will;Healthcare Power of [place];Living will Living will  Does patient want to make changes to medical advance directive? No - Patient declined - - - - - -  Copy of [place] in Chart? - No - copy requested No - copy requested No - copy requested - Yes -    Current Medications (verified) Outpatient Encounter Medications as of 09/10/2020  Medication Sig  . acetaminophen (TYLENOL) 500 MG tablet Take 500 mg by mouth daily as needed (arthritis pain).   Marland Kitchen amiodarone (PACERONE) 200 MG tablet Take 0.5 tablets (100 mg total) by mouth daily.  Marland Kitchen ELIQUIS 5 MG TABS tablet TAKE 1 TABLET BY MOUTH  TWICE DAILY  . metoprolol succinate (TOPROL-XL) 25 MG 24 hr  tablet Take 1 tablet (25 mg total) by mouth daily.  Marland Kitchen moxifloxacin (VIGAMOX) 0.5 % ophthalmic solution Place into the left eye.  . prednisoLONE acetate (PRED FORTE) 1 % ophthalmic suspension Place 1 drop into the right eye 4 (four) times daily.  . [DISCONTINUED] furosemide (LASIX) 40 MG tablet Take 0.5 tablets (20 mg total) by mouth daily as needed for fluid. (Patient not taking: Reported on 08/26/2020)   No facility-administered encounter medications on file as of 09/10/2020.    Allergies (verified) Patient has no known allergies.   History: Past Medical History:  Diagnosis Date  . Atrial tachycardia ([place])   . Carotid bruit 2005   right, carotid doppler: <39% occlusion  . Hyperlipidemia    LDL goal =<120  . Hyperplastic colonic polyp 2006   Dr. Carlean Purl, due 2016  . Hypertension   . Osteoporosis    S/P  biphosphonates x 5 yrs  . Persistent atrial fibrillation [facility])    Past Surgical History:  Procedure Laterality Date  . CARDIOVERSION N/A 03/31/2015   Procedure: CARDIOVERSION;  Surgeon: Sanda Klein, MD;  Location: [facility] ENDOSCOPY;  Service: Cardiovascular;  Laterality: N/A;  . CARDIOVERSION N/A 12/21/2015   Procedure: CARDIOVERSION;  Surgeon: Lelon Perla, MD;  Location: [facility] ENDOSCOPY;  Service: Cardiovascular;  Laterality: N/A;  . CARDIOVERSION N/A 01/09/2017   Procedure: CARDIOVERSION;  Surgeon: Jerline Pain, MD;  Location: [place];  Service: Cardiovascular;  Laterality: N/A;  . CARDIOVERSION N/A 07/09/2019   Procedure: CARDIOVERSION;  Surgeon: Sanda Klein, MD;  Location: [place];  Service: Cardiovascular;  Laterality: N/A;  . CARDIOVERSION N/A 01/14/2020   Procedure: CARDIOVERSION;  Surgeon: Geralynn Rile, MD;  Location: [place];  Service: Cardiovascular;  Laterality: N/A;  . CARDIOVERSION N/A 02/23/2020   Procedure: CARDIOVERSION;  Surgeon: Jerline Pain, MD;  Location[facility] ENDOSCOPY;  Service: Cardiovascular;  Laterality: N/A;  . COLONOSCOPY W/  POLYPECTOMY  2006  . DILATION AND CURETTAGE OF UTERUS    . MOUTH SURGERY  2009-2010   implants, Dr. Marcelyn Ditty   Family History  Problem Relation Age of Onset  . Coronary artery disease Father        MI in 18s  . Cancer Sister        breast  . Coronary artery disease Sister        stent 2009  . Cancer Paternal Aunt        colon, possible breast cancer  . Arthritis Other        aunts  . Stroke Neg Hx    Social History   Socioeconomic History  . Marital status: Married    Spouse name: Not on file  . Number of children: Not on file  . Years of education: Not on file  . Highest education level: Not on file  Occupational History  . 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 Smoking status: Former Smoker    Quit date: 10/30/1976    Years since quitting: 43.8  . Smokeless tobacco: Never Used  . Tobacco comment: Patient would ONLY smoke occasionally   Substance and Sexual Activity  . Alcohol use: No  . Drug use: No  . Sexual activity: Not on file  Other Topics Concern  . Not on file  Social History Narrative   Regular exercise: yes: walking 30 minute once daily   Social Determinants of Health   Financial Resource Strain: Low Risk   . Difficulty of Paying Living Expenses: Not hard at all  Food Insecurity: No Food Insecurity  . Worried About Charity fundraiser in the Last Year: Never true  . Ran Out of Food in the Last Year: Never true  Transportation Needs: No Transportation Needs  . Lack of Transportation (Medical): No  . Lack of Transportation (Non-Medical): No  Physical Activity: Sufficiently Active  . Days of Exercise per Week: 5 days  . Minutes of Exercise per Session: 30 min  Stress: No Stress Concern Present  . Feeling of Stress : Not at all  Social Connections: Socially Integrated  . Frequency of Communication with Friends and Family: More than three times a week  . Frequency of Social Gatherings with Friends and Family: More than three times  a week  . Attends Religious Services: More than 4 times per year  . Active Member of Clubs or Organizations: Yes  . Attends Archivist Meetings: More than 4 times per year  . Marital Status: Married    Tobacco Counseling Counseling given: Not Answered Comment: Patient would ONLY smoke occasionally    Clinical Intake:  Pre-visit preparation completed: Yes  Pain : No/denies pain     BMI - recorded: 23.42 Nutritional Status: BMI of 19-24  Normal Nutritional Risks: None Diabetes: No  How often do you need to have someone help you when you read instructions, pamphlets, or other written materials from your doctor or pharmacy?: 1 - Never What is the last grade level you completed in school?: HSG  Diabetic? no  Interpreter Needed?: No  Information entered by :: [employer]  Nikitia Asbill, LPN   Activities of Daily Living In your present state of health, do you have any difficulty performing the following activities: 09/10/2020 09/10/2020  Hearing? N N  Vision? N N  Difficulty concentrating or making decisions? N N  Walking or climbing stairs? N N  Dressing or bathing? N N  Doing errands, shopping? N N  Preparing Food and eating ? N -  Using the Toilet? N -  In the past six months, have you accidently leaked urine? Y -  Do you have problems with loss of bowel control? N -  Managing your Medications? N -  Managing your Finances? N -  Housekeeping or managing your Housekeeping? N -  Some recent data might be hidden    Patient Care Team: Binnie Rail, MD as PCP - General (Internal Medicine) Charlton Haws, [facility] as Pharmacist (Pharmacist) Melissa Noon, [place] as Referring Physician (Optometry)  Indicate any recent Medical Services you may have received from other than [facility] providers in the past year (date may be approximate).     Assessment:   This is a routine wellness examination for Molly Tucker.  Hearing/Vision screen No exam data present  Dietary issues and  exercise activities discussed: Current Exercise Habits: Home exercise routine, Type of exercise: walking, Time (Minutes): 30, Frequency (Times/Week): 5, Weekly Exercise (Minutes/Week): 150, Intensity: Moderate, Exercise limited by: None identified  Goals    . Pharmacy Care Plan     CARE PLAN ENTRY (see longitudinal plan of care for additional care plan information)  Current Barriers:  . Chronic Disease Management support, education, and care coordination needs related to Hypertension, Hyperlipidemia, Atrial Fibrillation, and Osteoporosis   Hypertension / AFib BP Readings from Last 3 Encounters:  08/16/20 130/80  04/19/20 130/80  03/17/20 (!) 164/82 .  Pharmacist Clinical Goal(s): o Over the next 180 days, patient will work with PharmD and providers to maintain BP goal <140/90 . Current regimen:  o Amiodarone 200 mg - 1/2 tab daily o Metoprolol succinate 25 mg daily o Eliquis 5 mg twice a day . Interventions: o Discussed BP goals and benefits of medications for prevention of heart attack / stroke o Discussed benefits of Eliquis and risk of bleeding; pt denies bleeding issues . Patient self care activities - Over the next 180 days, patient will: o Check BP 2-3 times weekly, document, and provide at future appointments o Avoid NSAIDs and monitor for bleeding  Hyperlipidemia Lab Results  Component Value Date/Time   LDLCALC 118 (H) 04/19/2020 02:45 PM .  Pharmacist Clinical Goal(s): o Over the next 180 days, patient will work with PharmD and providers to maintain LDL goal < 130 . Current regimen:  o No medications . Interventions: o Discussed cholesterol goals and benefits of diet/exercise for prevention of heart attack / stroke . Patient self care activities - Over the next 180 days, patient will: o Continue low cholesterol diet  Osteoporosis . Pharmacist Clinical Goal(s) o Over the next 180 days, patient will work with PharmD and providers to optimize therapy . Current  regimen:  o No medications . Interventions: o Advised 1200 mg of calcium and 800 units of Vitamin D per day . Patient self care activities - Over the next 180 days, patient will: o Take Calcium and Vitamin D supplement once daily  Medication management . Pharmacist Clinical Goal(s): o Over the next 180 days, patient will work with PharmD and providers to maintain optimal medication adherence . Current pharmacy: [facility] . Interventions  o Comprehensive medication review performed. o Continue current medication management strategy . Patient self care activities - Over the next 180 days, patient will: o Focus on medication adherence by pill box o Take medications as prescribed o Report any questions or concerns to PharmD and/or provider(s)  Initial goal documentation      Depression Screen PHQ 2/9 Scores 09/10/2020 09/10/2020 09/09/2019 04/19/2018 04/06/2017 02/22/2015 11/21/2013  PHQ - 2 Score 0 0 0 0 0 0 0    Fall Risk Fall Risk  09/10/2020 09/10/2020 09/09/2019 04/19/2018 04/06/2017  Falls in the past year? 0 0 0 No No  Number falls in past yr: 0 0 0 - -  Injury with Fall? 0 0 - - -  Risk for fall due to : No Fall Risks No Fall Risks - - -  Follow up Falls evaluation completed;Education provided Falls evaluation completed - - -    Any stairs in or around the home? Yes  If so, are there any without handrails? No  Home free of loose throw rugs in walkways, pet beds, electrical cords, etc? Yes  Adequate lighting in your home to reduce risk of falls? Yes   ASSISTIVE DEVICES UTILIZED TO PREVENT FALLS:  Life alert? No  Use of a cane, walker or w/c? No  Grab bars in the bathroom? Yes  Shower chair or bench in shower? Yes  Elevated toilet seat or a handicapped toilet? Yes   TIMED UP AND GO:  Was the test performed? No .  Length of time to ambulate 10 feet: 0 sec.   Gait steady and fast without use of assistive device  Cognitive Function:     6CIT Screen  09/10/2020  What Year? 0 points  What month? 0 points  What time? 0 points  Count back from 20 0 points  Months in reverse 0 points  Repeat phrase 0 points  Total Score 0    Immunizations Immunization History  Administered Date(s) Administered  . Fluad Quad(high Dose 65+) 08/04/2019, 08/16/2020  . Influenza Split 08/31/2011, 11/14/2012  . Influenza Whole 09/10/2007  . Influenza, High Dose Seasonal PF 11/21/2013, 10/19/2015, 08/29/2017  . Influenza-Unspecified 07/30/2014, 08/01/2018  . PFIZER SARS-COV-2 Vaccination 11/19/2019, 12/10/2019  . Pneumococcal Conjugate-13 08/04/2019  . Tdap 08/22/2011    TDAP status: Up to date Flu Vaccine status: Up to date Pneumococcal vaccine status: Up to date Covid-19 vaccine status: Completed vaccines  Qualifies for Shingles Vaccine? Yes   Zostavax completed No   Shingrix Completed?: No.    Education has been provided regarding the importance of this vaccine. Patient has been advised to call insurance company to determine out of pocket expense if they have not yet received this vaccine. Advised may also receive vaccine at local pharmacy or Health Dept. Verbalized acceptance and understanding.  Screening Tests Health Maintenance  Topic Date Due  . PNA vac Low Risk Adult (2 of 2 - PPSV23) 09/10/2021 (Originally 08/03/2020)  . DEXA SCAN  09/10/2034 (Originally 12/25/1998)  . TETANUS/TDAP  08/21/2021  . INFLUENZA VACCINE  Completed  . COVID-19 Vaccine  Completed    Health Maintenance  There are no preventive care reminders to display for this patient.  Colorectal cancer screening: No longer required.  Mammogram status: No longer required.  Bone Density status: Never done  Lung Cancer Screening: (Low Dose CT Chest recommended if Age 39-80 years, 30 pack-year currently smoking OR have quit w/in 15years.) does not qualify.   Lung Cancer Screening Referral: no  Additional Screening:  Hepatitis  C Screening: does not qualify; Completed  no  Vision Screening: Recommended annual ophthalmology exams for early detection of glaucoma and other disorders of the eye. Is the patient up to date with their annual eye exam?  Yes  Who is the provider or what is the name of the office in which the patient attends annual eye exams? Katy Apo, MD If pt is not established with a provider, would they like to be referred to a provider to establish care? No .   Dental Screening: Recommended annual dental exams for proper oral hygiene  Community Resource Referral / Chronic Care Management: CRR required this visit?  No   CCM required this visit?  No      Plan:     I have personally reviewed and noted the following in the patient's chart:   . Medical and social history . Use of alcohol, tobacco or illicit drugs  . Current medications and supplements . Functional ability and status . Nutritional status . Physical activity . Advanced directives . List of other physicians . Hospitalizations, surgeries, and ER visits in previous 12 months . Vitals . Screenings to include cognitive, depression, and falls . Referrals and appointments  In addition, I have reviewed and discussed with patient certain preventive protocols, quality metrics, and best practice recommendations. A written personalized care plan for preventive services as well as general preventive health recommendations were provided to patient.     Sheral Flow, LPN   [id number]   Nurse Notes: n/a

## 2020-09-15 DIAGNOSIS — H353131 Nonexudative age-related macular degeneration, bilateral, early dry stage: Secondary | ICD-10-CM | POA: Diagnosis not present

## 2020-10-06 DIAGNOSIS — Z961 Presence of intraocular lens: Secondary | ICD-10-CM | POA: Diagnosis not present

## 2020-10-12 ENCOUNTER — Encounter: Payer: Self-pay | Admitting: Internal Medicine

## 2020-10-20 ENCOUNTER — Telehealth: Payer: Self-pay | Admitting: Nurse Practitioner

## 2020-10-20 ENCOUNTER — Other Ambulatory Visit: Payer: Self-pay | Admitting: *Deleted

## 2020-10-20 MED ORDER — FUROSEMIDE 20 MG PO TABS
20.0000 mg | ORAL_TABLET | ORAL | 0 refills | Status: DC | PRN
Start: 2020-10-20 — End: 2020-12-06

## 2020-10-20 MED ORDER — AMIODARONE HCL 200 MG PO TABS: 100.0000 mg | ORAL_TABLET | Freq: Every day | ORAL | 3 refills | Status: DC

## 2020-10-20 NOTE — Telephone Encounter (Signed)
S/w pt has been in A-Fib for 1 month and this has been tolerable. Pt denies SOB,CP,nausea, lightheadedness, dizziness has a small amount of swelling, up 1 lb wt gain.  Does not want to go to A-Fib clinic and would like to wait to see Molly Tucker. Dose not want another cardioversion. Wants to wait till after christmas. Will send to Molly Tucker to advise.

## 2020-10-20 NOTE — Telephone Encounter (Signed)
S/w pt is aware of recommendations and medication list updated. Labs added to upcoming appt notes.

## 2020-10-20 NOTE — Telephone Encounter (Signed)
Patient c/o Palpitations:  High priority if patient c/o lightheadedness, shortness of breath, or chest pain  1) How long have you had palpitations/irregular HR/ Afib? Are you having the symptoms now? yes  2) Are you currently experiencing lightheadedness, SOB or CP? no  3) Do you have a history of afib (atrial fibrillation) or irregular heart rhythm? yes  4) Have you checked your BP or HR? (document readings if available): no  5) Are you experiencing any other symptoms? Swelling in feet and ankles  Pt c/o swelling: STAT is pt has developed SOB within 24 hours  1) How much weight have you gained and in what time span? 1 pound in the past couple days  2) If swelling, where is the swelling located? Feet and ankles  3) Are you currently taking a fluid pill? As needed   4) Are you currently SOB? no  5) Do you have a log of your daily weights (if so, list)?    12/20: 137  12/21: 138  6) Have you gained 3 pounds in a day or 5 pounds in a week? No ~1 pound since last OV  7) Have you traveled recently? No   Patient is not sure if she needs to start taking a diuretic again. If so, she will need a new rx to her pharmacy. She only has 3 pills left

## 2020-10-20 NOTE — Telephone Encounter (Signed)
Let's let her have some more Lasix on hand - she can have the 20 mg dose to use prn for swelling or weight gain.   She could also increase the Amiodarone to 200 mg a day until seen back - this may help her get back into rhythm.   We will check lab on return visit.   [name]

## 2020-11-02 ENCOUNTER — Ambulatory Visit
Admission: RE | Admit: 2020-11-02 | Discharge: 2020-11-02 | Disposition: A | Discharge status: Home / Self Care | Payer: Medicare Other | Source: Ambulatory Visit | Attending: Internal Medicine | Admitting: Internal Medicine

## 2020-11-02 ENCOUNTER — Other Ambulatory Visit: Payer: Self-pay

## 2020-11-02 ENCOUNTER — Other Ambulatory Visit: Payer: Self-pay | Admitting: Internal Medicine

## 2020-11-02 DIAGNOSIS — R922 Inconclusive mammogram: Secondary | ICD-10-CM | POA: Diagnosis not present

## 2020-11-02 DIAGNOSIS — N6325 Unspecified lump in the left breast, overlapping quadrants: Secondary | ICD-10-CM

## 2020-11-02 DIAGNOSIS — N6324 Unspecified lump in the left breast, lower inner quadrant: Secondary | ICD-10-CM | POA: Diagnosis not present

## 2020-11-03 NOTE — Progress Notes (Signed)
CARDIOLOGY OFFICE NOTE  Date:  11/08/2020    Molly Tucker Date of Birth: [birth year] Medical Record [id number]  PCP:  Binnie Rail, MD  Cardiologist:  Ree Shay    Chief Complaint  Patient presents with  . Atrial Fibrillation    Seen for [name]    History of Present Illness: Molly Tucker is a 87 y.o. female who presents today for a work in visit. Seen for [name]. Primarily follows with me. She has been seen in the AF clinic also.    She has a history of PAF, HTN, HLD, and osteoporosis. She has had prior cardioversions back to NSR - with improvement in symptoms. Cardioverted in June of 2016, February of 2017, March of 2018 and September of 2020. She tends to have worsening volume overload when in AF.    When seen last December 2020 - she was doing well - was in NSR at that time. Reverted back in March to AF - we got her cardioverted again which failed and sent her to the AF clinic to discuss options - ended up being placed on amiodarone with repeat cardioversion with 3 shocks and conversion back to NSR 20 minutes later  (her husband is on this as well). She had trouble with tolerating the dose and is now only on 100 mg a day.  Echo was updated - she typically has worsening diastolic heart failure when in AF.   Last seen in October - she remained in NSR. She was concerned about a nodular area in her breast.   Called right before Christmas with concerns for being back in AF - wanted to wait until after the holidays to be seen - amiodarone was increased back up. She remained on anticoagulation and her prn Lasix.    Last seen by me back in mid June - she was doing ok - maintaining NSR and tolerating her regimen. BP is typically lower at home.     Comes in today. Here alone. She called right before Christmas - back in AF - did not want to be seen until after the holidays. I increased her amiodarone back to 200 mg a day. Scheduled this visit for her. She notes this  happened actually on November 22nd - she tolerated pretty well initially - when she called me - this was more due to swelling. She has had her cataracts done - this did not go all that well. She is going to have a needle biopsy on January 19th for this place in her breast. Now more nauseated - can eat and then feels ok. Using Lasix prn. Not really short of breath. This episode of AF not as severe or as symptomatic as she typically has been in the past.   Past Medical History:  Diagnosis Date  . Atrial tachycardia ([place])   . Carotid bruit 2005   right, carotid doppler: <39% occlusion  . Hyperlipidemia    LDL goal =<120  . Hyperplastic colonic polyp 2006   Dr. Carlean Purl, due 2016  . Hypertension   . Osteoporosis    S/P  biphosphonates x 5 yrs  . Persistent atrial fibrillation [facility])     Past Surgical History:  Procedure Laterality Date  . CARDIOVERSION N/A 03/31/2015   Procedure: CARDIOVERSION;  Surgeon: Sanda Klein, MD;  Location: [facility] ENDOSCOPY;  Service: Cardiovascular;  Laterality: N/A;  . CARDIOVERSION N/A 12/21/2015   Procedure: CARDIOVERSION;  Surgeon: Lelon Perla, MD;  Location: [facility] ENDOSCOPY;  Service: Cardiovascular;  Laterality: N/A;  . CARDIOVERSION N/A 01/09/2017   Procedure: CARDIOVERSION;  Surgeon: Jerline Pain, MD;  Location: [place];  Service: Cardiovascular;  Laterality: N/A;  . CARDIOVERSION N/A 07/09/2019   Procedure: CARDIOVERSION;  Surgeon: Sanda Klein, MD;  Location: [facility] ENDOSCOPY;  Service: Cardiovascular;  Laterality: N/A;  . CARDIOVERSION N/A 01/14/2020   Procedure: CARDIOVERSION;  Surgeon: Geralynn Rile, MD;  Location: [place];  Service: Cardiovascular;  Laterality: N/A;  . CARDIOVERSION N/A 02/23/2020   Procedure: CARDIOVERSION;  Surgeon: Jerline Pain, MD;  Location: [facility] ENDOSCOPY;  Service: Cardiovascular;  Laterality: N/A;  . COLONOSCOPY W/ POLYPECTOMY  2006  . DILATION AND CURETTAGE OF UTERUS    . MOUTH SURGERY  2009-2010   implants, Dr.  Marcelyn Ditty     Medications: Current Meds  Medication Sig  . acetaminophen (TYLENOL) 500 MG tablet Take 500 mg by mouth daily as needed (arthritis pain).   Marland Kitchen amiodarone (PACERONE) 200 MG tablet Take 0.5 tablets (100 mg total) by mouth daily. Take one tablet (200 mg) until seen back in f/u Jan 10. (Patient taking differently: Take 200 mg by mouth daily. Take one tablet (200 mg) until seen back in f/u Jan 10.)  . ELIQUIS 5 MG TABS tablet TAKE 1 TABLET BY MOUTH  TWICE DAILY  . furosemide (LASIX) 20 MG tablet Take 1 tablet (20 mg total) by mouth as needed. For 2 Lb wt gain or swelling.  . metoprolol succinate (TOPROL-XL) 25 MG 24 hr tablet Take 1 tablet (25 mg total) by mouth daily.  Marland Kitchen moxifloxacin (VIGAMOX) 0.5 % ophthalmic solution Place into the left eye.  . prednisoLONE acetate (PRED FORTE) 1 % ophthalmic suspension Place 1 drop into the right eye 4 (four) times daily.     Allergies: No Known Allergies  Social History: The patient  reports that she quit smoking about 44 years ago. She has never used smokeless tobacco. She reports that she does not drink alcohol and does not use drugs.   Family History: The patient's family history includes Arthritis in an other family member; Cancer in her paternal aunt and sister; Coronary artery disease in her father and sister.   Review of Systems: Please see the history of present illness.   All other systems are reviewed and negative.   Physical Exam: VS:  BP (!) 180/88   Pulse 87   Ht 5\' 4"  (1.626 m)   Wt 141 lb (64 kg)   SpO2 97%   BMI 24.20 kg/m  .  BMI Body mass index is 24.2 kg/m.  Wt Readings from Last 3 Encounters:  11/08/20 141 lb (64 kg)  09/10/20 136 lb 7.4 oz (61.9 kg)  09/10/20 136 lb 6.4 oz (61.9 kg)    General: Pleasant. Alert and in no acute distress.   Cardiac: Irregular irregular rhythm. Rate is fine. No murmurs, rubs, or gallops. Trace edema. She has lots of varicosities.  Respiratory:  Lungs are clear to  auscultation bilaterally with normal work of breathing.  GI: Soft and nontender.  MS: No deformity or atrophy. Gait and ROM intact.  Skin: Warm and dry. Color is normal.  Neuro:  Strength and sensation are intact and no gross focal deficits noted.  Psych: Alert, appropriate and with normal affect.   LABORATORY DATA:  EKG:  EKG is ordered today.  Personally reviewed by me. This demonstrates Af with controlled VR of 87.  Lab Results  Component Value Date   WBC 5.7 08/16/2020  HGB 14.5 08/16/2020   HCT 42.5 08/16/2020   PLT 197 08/16/2020   GLUCOSE 87 08/16/2020   CHOL 195 04/19/2020   TRIG 102 04/19/2020   HDL 59 04/19/2020   LDLCALC 118 (H) 04/19/2020   ALT 15 08/16/2020   AST 18 08/16/2020   NA 143 08/16/2020   K 4.8 08/16/2020   CL 105 08/16/2020   CREATININE 0.90 08/16/2020   BUN 14 08/16/2020   CO2 26 08/16/2020   TSH 2.690 08/16/2020   INR 1.0 01/07/2020   HGBA1C 5.8 12/22/2009     BNP (last 3 results) No results for input(s): BNP in the last 8760 hours.  ProBNP (last 3 results) No results for input(s): PROBNP in the last 8760 hours.   Other Studies Reviewed Today:  Electrical Cardioversion Procedure Note 01/2020   Procedure: Electrical Cardioversion Indications:  Atrial Fibrillation   Time Out: Verified patient identification, verified procedure,medications/allergies/relevent history reviewed, required imaging and test results available.  Performed   Procedure Details   The patient was NPO after midnight. Anesthesia was administered at the beside  by Dr. Ermalene Postin with 60mg  of propofol.  Cardioversion was performed with synchronized biphasic defibrillation via AP pads with 120, 150, 200 joules.  3 attempt(s) were performed.  The patient converted to normal sinus rhythm for about 30 seconds after the second shock then reverted back to atrial flutter with variable conduction. Another attempt at 200J was made but unsuccessful. The patient tolerated the procedure  well    IMPRESSION:   Unsuccessful cardioversion of atrial fibrillation. Currently in slow atrial flutter with variable conduction HR 80-90.       [name] 02/23/2020, 11:27 AM     ECHO IMPRESSIONS 02/2020   1. Left ventricular ejection fraction, by estimation, is 60 to 65%. The  left ventricle has normal function. The left ventricle has no regional  wall motion abnormalities. Left ventricular diastolic parameters are  consistent with Grade II diastolic  dysfunction (pseudonormalization). Elevated left ventricular end-diastolic  pressure.   2. Right ventricular systolic function is normal. The right ventricular  size is moderately enlarged. There is moderately elevated pulmonary artery  systolic pressure. The estimated right ventricular systolic pressure is  [id number] mmHg.   3. Left atrial size was moderately dilated.   4. Right atrial size was severely dilated.   5. The mitral valve is normal in structure. Mild mitral valve  regurgitation. No evidence of mitral stenosis.   6. Tricuspid valve regurgitation is moderate.   7. The aortic valve is tricuspid. Aortic valve regurgitation is not  visualized. Mild aortic valve sclerosis is present, with no evidence of  aortic valve stenosis.   8. The inferior vena cava is normal in size with <50% respiratory  variability, suggesting right atrial pressure of 8 mmHg.        Assessment/Plan:   1. Persistent AF - has had prior multiple cardioversions - she is on amiodarone which has been increased - she is tolerating this spell of AF pretty well. She typically has worsening diastolic HF. She does not wish to transition to Tikosyn due to taking care of her husband and hospitalization that is needed. Not candidate for ablation given her age. We have opted to stay on the 200 mg of amiodarone for now - see back as planned next month. Could repeat cardioversion versus rate control option - may also need to consider 14 day monitor.   2. HTN - BP  better at home - recheck by me is 150/90 -  she will continue to monitor.   3. Chronic diastolic HF - to use Lasix prn weight gain of 2 or more pounds overnight - she will go back to wearing compression stockings.   4. Chronic anticoagulation - no problems noted.   5. Upcoming breast needle biopsy.   Current medicines are reviewed with the patient today.  The patient does not have concerns regarding medicines other than what has been noted above.  The following changes have been made:  See above.  Labs/ tests ordered today include:    Orders Placed This Encounter  Procedures  . Basic metabolic panel  . CBC  . Hepatic function panel  . TSH  . EKG 12-Lead     Disposition:   FU with me as planned next month with EKG. Plan as above.    Patient is agreeable to this plan and will call if any problems develop in the interim.   SignedNorma Fredrickson, NP  11/08/2020 3:52 PM  [facility] [address] [place], [place]  [address] Phone: [phone] Fax: [phone]

## 2020-11-08 ENCOUNTER — Ambulatory Visit: Payer: Medicare Other | Admitting: Nurse Practitioner

## 2020-11-08 ENCOUNTER — Other Ambulatory Visit: Payer: Self-pay

## 2020-11-08 ENCOUNTER — Other Ambulatory Visit: Payer: Self-pay | Admitting: Nurse Practitioner

## 2020-11-08 ENCOUNTER — Encounter: Payer: Self-pay | Admitting: Nurse Practitioner

## 2020-11-08 VITALS — BP 180/88 | HR 87 | Ht 64.0 in | Wt 141.0 lb

## 2020-11-08 DIAGNOSIS — Z79899 Other long term (current) drug therapy: Secondary | ICD-10-CM | POA: Diagnosis not present

## 2020-11-08 DIAGNOSIS — I10 Essential (primary) hypertension: Secondary | ICD-10-CM | POA: Diagnosis not present

## 2020-11-08 DIAGNOSIS — I480 Paroxysmal atrial fibrillation: Secondary | ICD-10-CM | POA: Diagnosis not present

## 2020-11-08 DIAGNOSIS — Z7901 Long term (current) use of anticoagulants: Secondary | ICD-10-CM

## 2020-11-08 NOTE — Patient Instructions (Addendum)
After Visit Summary:  We will be checking the following labs today - BMET, CBC, HPF and TSH   Medication Instructions:    Continue with your current medicines.    If you need a refill on your cardiac medications before your next appointment, please call your pharmacy.     Testing/Procedures To Be Arranged:  N/A  Follow-Up:   See me as planned next month. Repeat EKG on return.     At [facility], you and your health needs are our priority.  As part of our continuing mission to provide you with exceptional heart care, we have created designated Provider Care Teams.  These Care Teams include your primary Cardiologist (physician) and Advanced Practice Providers (APPs -  Physician Assistants and Nurse Practitioners) who all work together to provide you with the care you need, when you need it.  Special Instructions:  . Stay safe, wash your hands for at least 20 seconds and wear a mask when needed.  . It was good to talk with you today.  . Get back to wearing compression stockings during the day . Use the Lasix for weight gain of 2 or more pounds overnight.    Call the [place] office at [phone] if you have any questions, problems or concerns.

## 2020-11-09 ENCOUNTER — Other Ambulatory Visit (HOSPITAL_COMMUNITY): Payer: Self-pay | Admitting: Nurse Practitioner

## 2020-11-09 ENCOUNTER — Other Ambulatory Visit: Payer: Self-pay | Admitting: *Deleted

## 2020-11-09 DIAGNOSIS — H01004 Unspecified blepharitis left upper eyelid: Secondary | ICD-10-CM | POA: Diagnosis not present

## 2020-11-09 DIAGNOSIS — H01001 Unspecified blepharitis right upper eyelid: Secondary | ICD-10-CM | POA: Diagnosis not present

## 2020-11-09 LAB — TSH: TSH: 3.5 u[IU]/mL (ref 0.450–4.500)

## 2020-11-09 LAB — HEPATIC FUNCTION PANEL
ALT: 14 IU/L (ref 0–32)
AST: 19 IU/L (ref 0–40)
Albumin: 3.9 g/dL (ref 3.6–4.6)
Alkaline Phosphatase: 49 IU/L (ref 44–121)
Bilirubin Total: 0.7 mg/dL (ref 0.0–1.2)
Bilirubin, Direct: 0.23 mg/dL (ref 0.00–0.40)
Total Protein: 6.1 g/dL (ref 6.0–8.5)

## 2020-11-09 LAB — BASIC METABOLIC PANEL
BUN/Creatinine Ratio: 14 (ref 12–28)
BUN: 16 mg/dL (ref 8–27)
CO2: 26 mmol/L (ref 20–29)
Calcium: 9 mg/dL (ref 8.7–10.3)
Chloride: 104 mmol/L (ref 96–106)
Creatinine, Ser: 1.16 mg/dL — ABNORMAL HIGH (ref 0.57–1.00)
GFR calc Af Amer: 49 mL/min/{1.73_m2} — ABNORMAL LOW (ref >59)
GFR calc non Af Amer: 43 mL/min/{1.73_m2} — ABNORMAL LOW (ref >59)
Glucose: 89 mg/dL (ref 65–99)
Potassium: 4.5 mmol/L (ref 3.5–5.2)
Sodium: 143 mmol/L (ref 134–144)

## 2020-11-09 LAB — CBC
Hematocrit: 43.3 % (ref 34.0–46.6)
Hemoglobin: 13.8 g/dL (ref 11.1–15.9)
MCH: 29.8 pg (ref 26.6–33.0)
MCHC: 31.9 g/dL (ref 31.5–35.7)
MCV: 94 fL (ref 79–97)
Platelets: 201 10*3/uL (ref 150–450)
RBC: 4.63 x10E6/uL (ref 3.77–5.28)
RDW: 12.7 % (ref 11.7–15.4)
WBC: 5.6 10*3/uL (ref 3.4–10.8)

## 2020-11-09 MED ORDER — AMIODARONE HCL 200 MG PO TABS: 200.0000 mg | ORAL_TABLET | Freq: Every day | ORAL | 0 refills | Status: DC

## 2020-11-09 MED ORDER — AMIODARONE HCL 200 MG PO TABS: 200.0000 mg | ORAL_TABLET | Freq: Every day | ORAL | 1 refills | Status: DC

## 2020-11-09 NOTE — Telephone Encounter (Signed)
Prescription refill request for Eliquis received.  Indication: Afib Last office visit: Servando Snare 11/09/2019 Scr: 1.16, 11/09/2019 Age: 87 yo Weight: 64 kg   Prescription refill sent.

## 2020-11-17 ENCOUNTER — Other Ambulatory Visit: Payer: Medicare Other

## 2020-11-30 ENCOUNTER — Ambulatory Visit
Admission: RE | Admit: 2020-11-30 | Discharge: 2020-11-30 | Disposition: A | Discharge status: Home / Self Care | Payer: Medicare Other | Source: Ambulatory Visit | Attending: Internal Medicine | Admitting: Internal Medicine

## 2020-11-30 ENCOUNTER — Other Ambulatory Visit: Payer: Self-pay | Admitting: Diagnostic Radiology

## 2020-11-30 ENCOUNTER — Other Ambulatory Visit: Payer: Self-pay

## 2020-11-30 DIAGNOSIS — Z170 Estrogen receptor positive status [ER+]: Secondary | ICD-10-CM | POA: Diagnosis not present

## 2020-11-30 DIAGNOSIS — N6325 Unspecified lump in the left breast, overlapping quadrants: Secondary | ICD-10-CM

## 2020-11-30 DIAGNOSIS — C50112 Malignant neoplasm of central portion of left female breast: Secondary | ICD-10-CM | POA: Diagnosis not present

## 2020-11-30 DIAGNOSIS — C50919 Malignant neoplasm of unspecified site of unspecified female breast: Secondary | ICD-10-CM

## 2020-11-30 DIAGNOSIS — D0512 Intraductal carcinoma in situ of left breast: Secondary | ICD-10-CM | POA: Diagnosis not present

## 2020-11-30 HISTORY — DX: Malignant neoplasm of unspecified site of unspecified female breast: C50.919

## 2020-12-05 DIAGNOSIS — Z79899 Other long term (current) drug therapy: Secondary | ICD-10-CM

## 2020-12-05 DIAGNOSIS — I480 Paroxysmal atrial fibrillation: Secondary | ICD-10-CM

## 2020-12-06 ENCOUNTER — Telehealth: Payer: Self-pay | Admitting: Pharmacist

## 2020-12-06 ENCOUNTER — Telehealth: Payer: Self-pay | Admitting: Nurse Practitioner

## 2020-12-06 ENCOUNTER — Other Ambulatory Visit: Payer: Self-pay | Admitting: General Surgery

## 2020-12-06 ENCOUNTER — Telehealth: Payer: Self-pay

## 2020-12-06 ENCOUNTER — Other Ambulatory Visit: Payer: Self-pay | Admitting: *Deleted

## 2020-12-06 DIAGNOSIS — Z7901 Long term (current) use of anticoagulants: Secondary | ICD-10-CM | POA: Diagnosis not present

## 2020-12-06 DIAGNOSIS — I4892 Unspecified atrial flutter: Secondary | ICD-10-CM | POA: Diagnosis not present

## 2020-12-06 DIAGNOSIS — C50312 Malignant neoplasm of lower-inner quadrant of left female breast: Secondary | ICD-10-CM

## 2020-12-06 DIAGNOSIS — Z170 Estrogen receptor positive status [ER+]: Secondary | ICD-10-CM | POA: Diagnosis not present

## 2020-12-06 DIAGNOSIS — Z803 Family history of malignant neoplasm of breast: Secondary | ICD-10-CM | POA: Diagnosis not present

## 2020-12-06 DIAGNOSIS — I4891 Unspecified atrial fibrillation: Secondary | ICD-10-CM | POA: Diagnosis not present

## 2020-12-06 MED ORDER — FUROSEMIDE 20 MG PO TABS: ORAL_TABLET | ORAL | 3 refills | Status: DC

## 2020-12-06 MED ORDER — AMIODARONE HCL 200 MG PO TABS: 200.0000 mg | ORAL_TABLET | Freq: Every day | ORAL | 3 refills | Status: DC

## 2020-12-06 NOTE — Telephone Encounter (Signed)
-----   Message from Burtis Junes, NP sent at 12/06/2020  1:32 PM EST ----- Can you give recommendations for holding Eliquis?  Thanks,  [name] ----- Message ----- From: Stark Klein, MD Sent: 12/06/2020   1:19 PM EST To: Deboraha Sprang, MD, Burtis Junes, NP  This lady has developed breast cancer and will need surgery under general anesthesia around 1.5 h in length.  Can we hold eliquis and does she need any additional testing for risk stratification?  Tx Stark Klein, MD Surgical Oncology

## 2020-12-06 NOTE — Telephone Encounter (Signed)
Harriett has been found to have breast cancer - needing surgery and seeing Dr. Barry Dienes later today.   Pharmacy has said to hold Eliquis 2 days prior to surgery.   I do not feel that any further cardiac testing will be needed from our standpoint. She does have now persistent AF - managing medically with rate control.   Burtis Junes, RN, [place] [address] [place], [place]  [phone]

## 2020-12-06 NOTE — Telephone Encounter (Signed)
S/w pt is already taking one tablet ( 20 mg) lasix Mon, Wed, and Fri, pt will go to one tablet by mouth (20 m g) every day.  Will update medication list. Pt does not need refill at this time. Pt will come in on Monday February 14 for bmet, orders in and linked.  Appt made

## 2020-12-06 NOTE — Telephone Encounter (Signed)
I attempted to call to clear the patient, it sounded as though she was at a doctor's appt. I will return call. I will also reach out to [name], who knows this patient well.

## 2020-12-06 NOTE — Telephone Encounter (Signed)
Received email from [employer]. [facility] ms. Manganelli will be seen today at 3pm by Dr. Barry Dienes. [facility] will send request for clearance once seen.

## 2020-12-06 NOTE — Telephone Encounter (Signed)
Eliquis to be held 2 days prior - per pharmacy.   I do not think further cardiac testing is warranted - she is now in persistent AF - managed with rate control - needing low dose Lasix for control of edema/diastolic dysfunction.   Ok to proceed.   [name]

## 2020-12-06 NOTE — Telephone Encounter (Signed)
   Primary Cardiologist: Virl Axe, MD  Chart reviewed as part of pre-operative protocol coverage. Patient was last seen in Jan 2022 by [name] NP and was doing well at that time. Per Ms Servando Snare, [place] to proceed with surgery, no additional cardiac testing.   Per our clinical pharmacist: Patient with diagnosis of afib on Eliquis for anticoagulation.    Procedure: breast cancer surgery Date of procedure: TBD  CHA2DS2-VASc Score = 5  This indicates a 7.2% annual risk of stroke. The patient's score is based upon: CHF History: Yes HTN History: Yes Diabetes History: No Stroke History: No Vascular Disease History: No Age Score: 2 Gender Score: 1     CrCl 30 ml/min  Per office protocol, patient can hold Eliquis for 2 days prior to procedure.    Therefore, based on ACC/AHA guidelines, the patient would be at acceptable risk for the planned procedure without further cardiovascular testing.   The patient was advised that if she develops new symptoms prior to surgery to contact our office to arrange for a follow-up visit, and she verbalized understanding.  I will route this recommendation to the requesting party via Epic fax function and remove from pre-op pool. Please call with questions.  [place], PA 12/06/2020, 3:01 PM

## 2020-12-06 NOTE — Telephone Encounter (Signed)
Patient with diagnosis of afib on Eliquis for anticoagulation.    Procedure: breast cancer surgery Date of procedure: TBD  CHA2DS2-VASc Score = 5  This indicates a 7.2% annual risk of stroke. The patient's score is based upon: CHF History: Yes HTN History: Yes Diabetes History: No Stroke History: No Vascular Disease History: No Age Score: 2 Gender Score: 1     CrCl 30 ml/min  Per office protocol, patient can hold Eliquis for 2 days prior to procedure.

## 2020-12-07 ENCOUNTER — Telehealth: Payer: Self-pay | Admitting: Pharmacist

## 2020-12-07 NOTE — Chronic Care Management (AMB) (Signed)
Chronic Care Management Pharmacy Assistant   Name: Molly Tucker  MRN: [id number] DOB: Nov 04, 1931  Reason for Encounter: Disease State  PCP : Binnie Rail, MD  Allergies:  No Known Allergies  Medications: Outpatient Encounter Medications as of 12/07/2020  Medication Sig   acetaminophen (TYLENOL) 500 MG tablet Take 500 mg by mouth daily as needed (arthritis pain).    amiodarone (PACERONE) 200 MG tablet Take 1 tablet (200 mg total) by mouth daily.   ELIQUIS 5 MG TABS tablet TAKE 1 TABLET BY MOUTH  TWICE DAILY   furosemide (LASIX) 20 MG tablet Take one tablet by mouth ( 20 mg) daily   metoprolol succinate (TOPROL XL) 25 MG 24 hr tablet Take 1 tablet (25 mg total) by mouth daily.   metoprolol succinate (TOPROL-XL) 25 MG 24 hr tablet Take 1 tablet (25 mg total) by mouth daily.   moxifloxacin (VIGAMOX) 0.5 % ophthalmic solution Place into the left eye.   prednisoLONE acetate (PRED FORTE) 1 % ophthalmic suspension Place 1 drop into the right eye 4 (four) times daily.   No facility-administered encounter medications on file as of 12/07/2020.    Current Diagnosis: Patient Active Problem List   Diagnosis Date Noted   Breast lump on left side at 9 o'clock position 09/10/2020   Prolapse of female bladder, acquired 09/09/2019   Leg cramps 04/06/2017   Osteoarthritis, hand 02/01/2016   Persistent atrial fibrillation ([place])    COLONIC POLYPS, HX OF 12/15/2008   HYPERLIPIDEMIA 11/21/2007   HTN (hypertension) 11/21/2007   Osteoporosis 09/13/2007    Reviewed chart prior to disease state call. Spoke with patient regarding BP  Recent Office Vitals: BP Readings from Last 3 Encounters:  11/08/20 (!) 180/88  09/10/20 128/80  09/10/20 128/80   Pulse Readings from Last 3 Encounters:  11/08/20 87  09/10/20 60  09/10/20 60    Wt Readings from Last 3 Encounters:  11/08/20 141 lb (64 kg)  09/10/20 136 lb 7.4 oz (61.9 kg)  09/10/20 136 lb 6.4 oz (61.9 kg)     Kidney  Function Lab Results  Component Value Date/Time   CREATININE 1.16 (H) 11/08/2020 04:01 PM   CREATININE 0.90 08/16/2020 02:42 PM   CREATININE 0.87 12/14/2015 02:15 PM   CREATININE 0.75 11/21/2013 03:53 PM   GFR 74.92 04/06/2017 09:46 AM   GFRNONAA 43 (L) 11/08/2020 04:01 PM   GFRAA 49 (L) 11/08/2020 04:01 PM    BMP Latest Ref Rng & Units 11/08/2020 08/16/2020 04/19/2020  Glucose 65 - 99 mg/dL 89 87 101(H)  BUN 8 - 27 mg/dL 16 14 16   Creatinine 0.57 - 1.00 mg/dL 1.16(H) 0.90 0.93  BUN/Creat Ratio 12 - 28 14 16 17   Sodium 134 - 144 mmol/L 143 143 141  Potassium 3.5 - 5.2 mmol/L 4.5 4.8 4.9  Chloride 96 - 106 mmol/L 104 105 104  CO2 20 - 29 mmol/L 26 26 26   Calcium 8.7 - 10.3 mg/dL 9.0 9.3 9.4       Current antihypertensive regimen:               -metoprolol succinate (TOPROL-XL) 25 MG 24 hr tablet  How often are you checking your Blood Pressure? daily  Current home BP readings:  130/68 , 138/56 ,110/65 , 128/77 , P rate 100 . Patient stated she has been  in afib since November 2021. Patient states her heart beats are getting closer together and is  is taking a diuretic for the swelling she is still  getting.  What recent interventions/DTPs have been made by any provider to improve Blood Pressure control since last CPP Visit: None Noted  Any recent hospitalizations or ED visits since last visit with CPP? No  What diet changes have been made to improve Blood Pressure Control?  o Patient stated she is trying to watch her salt intake. Patient stated she has swelling in her ankles at the end of day and states she weighs herself every morning. Patient stated she only gained 6 pounds since November when she was on afib  What exercise is being done to improve your Blood Pressure Control?  o Patient stated she has not been really getting exercise due to weather. Patient stated she is a walker and likes to walk .   Adherence Review: Is the patient currently on ACE/ARB medication?  No Does the patient have >5 day gap between last estimated fill dates? CPP to review   [place] ,[place] Pharmacist Assistant [phone]  Follow-Up:  Pharmacist Review

## 2020-12-08 ENCOUNTER — Encounter: Payer: Self-pay | Admitting: Adult Health

## 2020-12-08 DIAGNOSIS — Z170 Estrogen receptor positive status [ER+]: Secondary | ICD-10-CM | POA: Insufficient documentation

## 2020-12-08 DIAGNOSIS — C50312 Malignant neoplasm of lower-inner quadrant of left female breast: Secondary | ICD-10-CM | POA: Insufficient documentation

## 2020-12-08 NOTE — Progress Notes (Deleted)
CARDIOLOGY OFFICE NOTE  Date:  12/08/2020    Molly Tucker Date of Birth: [birth year] Medical Record #[id number]  PCP:  Binnie Rail, MD  Cardiologist:  Servando Snare & ***    No chief complaint on file.   History of Present Illness: Molly Tucker is a 87 y.o. female who presents today for a ***   Molly Tucker is a 87 y.o. female who presents today for a work in visit. Seen for [name]. Primarily follows with me. She has been seen in the AF clinic also.    She has a history of PAF, HTN, HLD, and osteoporosis. She has had prior cardioversions back to NSR - with improvement in symptoms. Cardioverted in June of 2016, February of 2017, March of 2018 and September of 2020. She tends to have worsening volume overload when in AF.    When seen last December 2020 - she was doing well - was in NSR at that time. Reverted back in March to AF - we got her cardioverted again which failed and sent her to the AF clinic to discuss options - ended up being placed on amiodarone with repeat cardioversion with 3 shocks and conversion back to NSR 20 minutes later  (her husband is on this as well). She had trouble with tolerating the dose and is now only on 100 mg a day.  Echo was updated - she typically has worsening diastolic heart failure when in AF.    Last seen in October - she remained in NSR. She was concerned about a nodular area in her breast.    Called right before Christmas with concerns for being back in AF - wanted to wait until after the holidays to be seen - amiodarone was increased back up. She remained on anticoagulation and her prn Lasix.    Last seen by me back in mid June - she was doing ok - maintaining NSR and tolerating her regimen. BP is typically lower at home.     Comes in today. Here alone. She called right before Christmas - back in AF - did not want to be seen until after the holidays. I increased her amiodarone back to 200 mg a day. Scheduled this visit for her. She  notes this happened actually on November 22nd - she tolerated pretty well initially - when she called me - this was more due to swelling. She has had her cataracts done - this did not go all that well. She is going to have a needle biopsy on January 19th for this place in her breast. Now more nauseated - can eat and then feels ok. Using Lasix prn. Not really short of breath. This episode of AF not as severe or as symptomatic as she typically has been in the past.    Comes in today. Here with   Past Medical History:  Diagnosis Date  . Atrial tachycardia ([place])   . Carotid bruit 2005   right, carotid doppler: <39% occlusion  . Hyperlipidemia    LDL goal =<120  . Hyperplastic colonic polyp 2006   Dr. Carlean Purl, due 2016  . Hypertension   . Osteoporosis    S/P  biphosphonates x 5 yrs  . Persistent atrial fibrillation [facility])     Past Surgical History:  Procedure Laterality Date  . CARDIOVERSION N/A 03/31/2015   Procedure: CARDIOVERSION;  Surgeon: Sanda Klein, MD;  Location: [facility] ENDOSCOPY;  Service: Cardiovascular;  Laterality: N/A;  . CARDIOVERSION N/A 12/21/2015  Procedure: CARDIOVERSION;  Surgeon: Lelon Perla, MD;  Location: [facility] ENDOSCOPY;  Service: Cardiovascular;  Laterality: N/A;  . CARDIOVERSION N/A 01/09/2017   Procedure: CARDIOVERSION;  Surgeon: Jerline Pain, MD;  Location: [place];  Service: Cardiovascular;  Laterality: N/A;  . CARDIOVERSION N/A 07/09/2019   Procedure: CARDIOVERSION;  Surgeon: Sanda Klein, MD;  Location: [place] ENDOSCOPY;  Service: Cardiovascular;  Laterality: N/A;  . CARDIOVERSION N/A 01/14/2020   Procedure: CARDIOVERSION;  Surgeon: Geralynn Rile, MD;  Location: [place];  Service: Cardiovascular;  Laterality: N/A;  . CARDIOVERSION N/A 02/23/2020   Procedure: CARDIOVERSION;  Surgeon: Jerline Pain, MD;  Location: [facility] ENDOSCOPY;  Service: Cardiovascular;  Laterality: N/A;  . COLONOSCOPY W/ POLYPECTOMY  2006  . DILATION AND CURETTAGE OF UTERUS    .  MOUTH SURGERY  2009-2010   implants, Dr. Marcelyn Ditty     Medications: No outpatient medications have been marked as taking for the 12/22/20 encounter (Appointment) with Burtis Junes, NP.     Allergies: No Known Allergies  Social History: The patient  reports that she quit smoking about 44 years ago. She has never used smokeless tobacco. She reports that she does not drink alcohol and does not use drugs.   Family History: The patient's ***family history includes Arthritis in an other family member; Cancer in her paternal aunt and sister; Coronary artery disease in her father and sister.   Review of Systems: Please see the history of present illness.   All other systems are reviewed and negative.   Physical Exam: VS:  There were no vitals taken for this visit. Marland Kitchen  BMI There is no height or weight on file to calculate BMI.  Wt Readings from Last 3 Encounters:  11/08/20 141 lb (64 kg)  09/10/20 136 lb 7.4 oz (61.9 kg)  09/10/20 136 lb 6.4 oz (61.9 kg)    General: Pleasant. Well developed, well nourished and in no acute distress.   HEENT: Normal.  Neck: Supple, no JVD, carotid bruits, or masses noted.  Cardiac: ***Regular rate and rhythm. No murmurs, rubs, or gallops. No edema.  Respiratory:  Lungs are clear to auscultation bilaterally with normal work of breathing.  GI: Soft and nontender.  MS: No deformity or atrophy. Gait and ROM intact.  Skin: Warm and dry. Color is normal.  Neuro:  Strength and sensation are intact and no gross focal deficits noted.  Psych: Alert, appropriate and with normal affect.   LABORATORY DATA:  EKG:  EKG {ACTION; IS/IS QQI:[id number]} ordered today.  Personally reviewed by me. This demonstrates ***.  Lab Results  Component Value Date   WBC 5.6 11/08/2020   HGB 13.8 11/08/2020   HCT 43.3 11/08/2020   PLT 201 11/08/2020   GLUCOSE 89 11/08/2020   CHOL 195 04/19/2020   TRIG 102 04/19/2020   HDL 59 04/19/2020   LDLCALC 118 (H) 04/19/2020    ALT 14 11/08/2020   AST 19 11/08/2020   NA 143 11/08/2020   K 4.5 11/08/2020   CL 104 11/08/2020   CREATININE 1.16 (H) 11/08/2020   BUN 16 11/08/2020   CO2 26 11/08/2020   TSH 3.500 11/08/2020   INR 1.0 01/07/2020   HGBA1C 5.8 12/22/2009     BNP (last 3 results) No results for input(s): BNP in the last 8760 hours.  ProBNP (last 3 results) No results for input(s): PROBNP in the last 8760 hours.   Other Studies Reviewed Today:   Assessment/Plan:  Electrical Cardioversion Procedure Note 01/2020  Procedure: Electrical Cardioversion Indications:  Atrial Fibrillation   Time Out: Verified patient identification, verified procedure,medications/allergies/relevent history reviewed, required imaging and test results available.  Performed   Procedure Details   The patient was NPO after midnight. Anesthesia was administered at the beside  by Dr. Ermalene Postin with 60mg  of propofol.  Cardioversion was performed with synchronized biphasic defibrillation via AP pads with 120, 150, 200 joules.  3 attempt(s) were performed.  The patient converted to normal sinus rhythm for about 30 seconds after the second shock then reverted back to atrial flutter with variable conduction. Another attempt at 200J was made but unsuccessful. The patient tolerated the procedure well    IMPRESSION:   Unsuccessful cardioversion of atrial fibrillation. Currently in slow atrial flutter with variable conduction HR 80-90.       [name] 02/23/2020, 11:27 AM     ECHO IMPRESSIONS 02/2020   1. Left ventricular ejection fraction, by estimation, is 60 to 65%. The  left ventricle has normal function. The left ventricle has no regional  wall motion abnormalities. Left ventricular diastolic parameters are  consistent with Grade II diastolic  dysfunction (pseudonormalization). Elevated left ventricular end-diastolic  pressure.   2. Right ventricular systolic function is normal. The right ventricular  size is moderately  enlarged. There is moderately elevated pulmonary artery  systolic pressure. The estimated right ventricular systolic pressure is  54.0 mmHg.   3. Left atrial size was moderately dilated.   4. Right atrial size was severely dilated.   5. The mitral valve is normal in structure. Mild mitral valve  regurgitation. No evidence of mitral stenosis.   6. Tricuspid valve regurgitation is moderate.   7. The aortic valve is tricuspid. Aortic valve regurgitation is not  visualized. Mild aortic valve sclerosis is present, with no evidence of  aortic valve stenosis.   8. The inferior vena cava is normal in size with <50% respiratory  variability, suggesting right atrial pressure of 8 mmHg.        Assessment/Plan:   1. Persistent AF - has had prior multiple cardioversions - she is on amiodarone which has been increased - she is tolerating this spell of AF pretty well. She typically has worsening diastolic HF. She does not wish to transition to Tikosyn due to taking care of her husband and hospitalization that is needed. Not candidate for ablation given her age. We have opted to stay on the 200 mg of amiodarone for now - see back as planned next month. Could repeat cardioversion versus rate control option - may also need to consider 14 day monitor.    2. HTN - BP better at home - recheck by me is 150/90 - she will continue to monitor.    3. Chronic diastolic HF - to use Lasix prn weight gain of 2 or more pounds overnight - she will go back to wearing compression stockings.    4. Chronic anticoagulation - no problems noted.    5. Upcoming breast needle biopsy.    Current medicines are reviewed with the patient today.  The patient does not have concerns regarding medicines other than what has been noted above.  The following changes have been made:  See above.  Labs/ tests ordered today include:   No orders of the defined types were placed in this encounter.    Disposition:   FU with *** in {gen  number [id number]} {Days to years:10300}.   Patient is agreeable to this plan and will call if any problems develop  in the interim.   SignedTruitt Merle, NP  12/08/2020 7:53 AM  [facility] [address] [place], [place]  [address] Phone: [phone] Fax: [phone]

## 2020-12-09 ENCOUNTER — Encounter: Payer: Self-pay | Admitting: *Deleted

## 2020-12-10 ENCOUNTER — Telehealth: Payer: Self-pay | Admitting: Oncology

## 2020-12-10 NOTE — Telephone Encounter (Signed)
Received referrals from Dr. Barry Dienes for genetics and med onc. Ms. Molly Tucker has been cld and scheduled to see Raquel Sarna for an in person visit on 2/17 at 10am and per her request she's been scheduled to see Dr. Jana Hakim on 3/9 at 4pm w/labs at 330pm.. Ms. Molly Tucker aware to arrive 15 minutes early for both appts.

## 2020-12-13 ENCOUNTER — Other Ambulatory Visit: Payer: Medicare Other

## 2020-12-13 NOTE — Progress Notes (Signed)
CARDIOLOGY OFFICE NOTE  Date:  12/14/2020    Molly Tucker Date of Birth: 1932/06/07 Medical Record #[id number]  PCP:  Molly Rail, MD  Cardiologist:  Molly Tucker  Chief Complaint  Patient presents with  . Atrial Fibrillation  . Pre-op Exam    History of Present Illness: Molly Tucker is a 87 y.o. female who presents today for a follow up visit. Seen for Dr. Caryl Tucker. Primarily follows with me. She has been seen in the AF clinic also.    She has a history of PAF, HTN, HLD, and osteoporosis. She has had prior cardioversions back to NSR - with improvement in symptoms. Cardioverted in June of 2016, February of 2017, March of 2018 and September of 2020. She tends to have worsening volume overload when in AF.    When seen last December 2020 - she was doing well - was in NSR at that time. Reverted back in March to AF - we got her cardioverted again which failed and sent her to the AF clinic to discuss options - ended up being placed on amiodarone with repeat cardioversion with 3 shocks and conversion back to NSR 20 minutes later  (her husband is on this as well). She had trouble with tolerating the dose and is now only on 100 mg a day.  Echo was updated - she typically has worsening diastolic heart failure when in AF. BP is typically lower at home.    When seen in October - she remained in NSR. She was concerned about a nodular area in her breast. Encouraged her to get a mammogram.    Called right before Christmas with concerns for being back in AF - wanted to wait until after the holidays to be seen - amiodarone was increased back up. She remained on anticoagulation and her prn Lasix which is now low dose every day. Last seen last month - had had a needle biopsy - more nauseated with the increased amiodarone. This spell of AF not as severe or as symptomatic as she typically has been in the past.    Tucker in today. [name]- I am seeing him as well. She is for left  lumpectomy next week. She will hold her Eliquis for 2 days prior. She remains in atrial fib. Little swelling -now on Lasix 20 mg every day. Needs labs today. Still with some queasy feeling - since the amiodarone was increased to 200 mg a day. She thinks she would like to have one more try at cardioversion. No chest pain. She is very active.   Past Medical History:  Diagnosis Date  . Atrial tachycardia ([place])   . Carotid bruit 2005   right, carotid doppler: <39% occlusion  . Hyperlipidemia    LDL goal =<120  . Hyperplastic colonic polyp 2006   Dr. Carlean Purl, due 2016  . Hypertension   . Osteoporosis    S/P  biphosphonates x 5 yrs  . Persistent atrial fibrillation [facility])     Past Surgical History:  Procedure Laterality Date  . CARDIOVERSION N/A 03/31/2015   Procedure: CARDIOVERSION;  Surgeon: Sanda Klein, MD;  Location: [facility] ENDOSCOPY;  Service: Cardiovascular;  Laterality: N/A;  . CARDIOVERSION N/A 12/21/2015   Procedure: CARDIOVERSION;  Surgeon: Lelon Perla, MD;  Location: [facility] ENDOSCOPY;  Service: Cardiovascular;  Laterality: N/A;  . CARDIOVERSION N/A 01/09/2017   Procedure: CARDIOVERSION;  Surgeon: Jerline Pain, MD;  Location: [place];  Service: Cardiovascular;  Laterality: N/A;  .  CARDIOVERSION N/A 07/09/2019   Procedure: CARDIOVERSION;  Surgeon: Sanda Klein, MD;  Location: [place] ENDOSCOPY;  Service: Cardiovascular;  Laterality: N/A;  . CARDIOVERSION N/A 01/14/2020   Procedure: CARDIOVERSION;  Surgeon: Geralynn Rile, MD;  Location: [place];  Service: Cardiovascular;  Laterality: N/A;  . CARDIOVERSION N/A 02/23/2020   Procedure: CARDIOVERSION;  Surgeon: Jerline Pain, MD;  Location: [facility] ENDOSCOPY;  Service: Cardiovascular;  Laterality: N/A;  . COLONOSCOPY W/ POLYPECTOMY  2006  . DILATION AND CURETTAGE OF UTERUS    . MOUTH SURGERY  2009-2010   implants, Dr. Marcelyn Ditty     Medications: Current Meds  Medication Sig  . acetaminophen (TYLENOL) 500 MG tablet Take 500 mg  by mouth daily as needed (arthritis pain).   Marland Kitchen amiodarone (PACERONE) 200 MG tablet Take 1 tablet (200 mg total) by mouth daily.  Marland Kitchen ELIQUIS 5 MG TABS tablet TAKE 1 TABLET BY MOUTH  TWICE DAILY  . furosemide (LASIX) 20 MG tablet Take one tablet by mouth ( 20 mg) daily  . metoprolol succinate (TOPROL XL) 25 MG 24 hr tablet Take 1 tablet (25 mg total) by mouth daily.     Allergies: No Known Allergies  Social History: The patient  reports that she quit smoking about 44 years ago. She has never used smokeless tobacco. She reports that she does not drink alcohol and does not use drugs.   Family History: The patient's family history includes Arthritis in an other family member; Cancer in her paternal aunt and sister; Coronary artery disease in her father and sister.   Review of Systems: Please see the history of present illness.   All other systems are reviewed and negative.   Physical Exam: VS:  BP (!) 144/80   Pulse 88   Ht 5\' 4"  (1.626 m)   Wt 145 lb (65.8 kg)   SpO2 96%   BMI 24.89 kg/m  .  BMI Body mass index is 24.89 kg/m.  Wt Readings from Last 3 Encounters:  12/14/20 145 lb (65.8 kg)  11/08/20 141 lb (64 kg)  09/10/20 136 lb 7.4 oz (61.9 kg)    General: Pleasant. Alert and in no acute distress.  She looks younger than her stated age.  Cardiac: Irregular irregular rhythm. Her rate is ok. She has 1+ bilateral edema - has lots of varicosities.   Respiratory:  Lungs are clear to auscultation bilaterally with normal work of breathing.  GI: Soft and nontender.  MS: No deformity or atrophy. Gait and ROM intact.  Skin: Warm and dry. Color is normal.  Neuro:  Strength and sensation are intact and no gross focal deficits noted.  Psych: Alert, appropriate and with normal affect.   LABORATORY DATA:  EKG:  EKG is ordered today.  Personally reviewed by me. This demonstrates AF with controlled VR - low voltage.  Lab Results  Component Value Date   WBC 5.6 11/08/2020   HGB 13.8  11/08/2020   HCT 43.3 11/08/2020   PLT 201 11/08/2020   GLUCOSE 89 11/08/2020   CHOL 195 04/19/2020   TRIG 102 04/19/2020   HDL 59 04/19/2020   LDLCALC 118 (H) 04/19/2020   ALT 14 11/08/2020   AST 19 11/08/2020   NA 143 11/08/2020   K 4.5 11/08/2020   CL 104 11/08/2020   CREATININE 1.16 (H) 11/08/2020   BUN 16 11/08/2020   CO2 26 11/08/2020   TSH 3.500 11/08/2020   INR 1.0 01/07/2020   HGBA1C 5.8 12/22/2009     BNP (  last 3 results) No results for input(s): BNP in the last 8760 hours.  ProBNP (last 3 results) No results for input(s): PROBNP in the last 8760 hours.   Other Studies Reviewed Today:  Electrical Cardioversion Procedure Note 01/2020   Procedure: Electrical Cardioversion Indications:  Atrial Fibrillation   Time Out: Verified patient identification, verified procedure,medications/allergies/relevent history reviewed, required imaging and test results available.  Performed   Procedure Details   The patient was NPO after midnight. Anesthesia was administered at the beside  by Dr. Ermalene Postin with 60mg  of propofol.  Cardioversion was performed with synchronized biphasic defibrillation via AP pads with 120, 150, 200 joules.  3 attempt(s) were performed.  The patient converted to normal sinus rhythm for about 30 seconds after the second shock then reverted back to atrial flutter with variable conduction. Another attempt at 200J was made but unsuccessful. The patient tolerated the procedure well    IMPRESSION:   Unsuccessful cardioversion of atrial fibrillation. Currently in slow atrial flutter with variable conduction HR 80-90.       [name] 02/23/2020, 11:27 AM     ECHO IMPRESSIONS 02/2020   1. Left ventricular ejection fraction, by estimation, is 60 to 65%. The  left ventricle has normal function. The left ventricle has no regional  wall motion abnormalities. Left ventricular diastolic parameters are  consistent with Grade II diastolic  dysfunction  (pseudonormalization). Elevated left ventricular end-diastolic  pressure.   2. Right ventricular systolic function is normal. The right ventricular  size is moderately enlarged. There is moderately elevated pulmonary artery  systolic pressure. The estimated right ventricular systolic pressure is  99.3 mmHg.   3. Left atrial size was moderately dilated.   4. Right atrial size was severely dilated.   5. The mitral valve is normal in structure. Mild mitral valve  regurgitation. No evidence of mitral stenosis.   6. Tricuspid valve regurgitation is moderate.   7. The aortic valve is tricuspid. Aortic valve regurgitation is not  visualized. Mild aortic valve sclerosis is present, with no evidence of  aortic valve stenosis.   8. The inferior vena cava is normal in size with <50% respiratory  variability, suggesting right atrial pressure of 8 mmHg.        Assessment/Plan:  1. Upcoming left breast lumpectomy - felt to be an acceptable candidate - holding Eliquis for 2 days.   2. Persistent AF - has had prior multiple cardioversions - now on amiodarone 200 mg a day - she thinks she would like to attempt cardioversion one more time - if so and restores NSR - then hope to cut the amiodarone back to her prior dose of 100 mg a day. Other option is to stop amiodarone altogether and focus on rate control. Will get her back with Joseph Art' in one month.   3. HTN - better control at home.   4. Chronic diastolic dysfunction - now on daily Lasix - lab today.   5. Chronic anticoagulation - no problems noted.   Current medicines are reviewed with the patient today.  The patient does not have concerns regarding medicines other than what has been noted above.  The following changes have been made:  See above.  Labs/ tests ordered today include:    Orders Placed This Encounter  Procedures  . EKG 12-Lead     Disposition:   FU with Joseph Art' in one month with EKG - decide then about proceeding on with  cardioversion versus focus on rate control. Amiodarone will be continued  for now.     Patient is agreeable to this plan and will call if any problems develop in the interim.   SignedTruitt Merle, NP  12/14/2020 12:05 PM  [facility] [address] [place], [place]  [address] Phone: [phone] Fax: [phone]

## 2020-12-14 ENCOUNTER — Ambulatory Visit: Payer: Medicare Other | Admitting: Nurse Practitioner

## 2020-12-14 ENCOUNTER — Other Ambulatory Visit: Payer: Self-pay

## 2020-12-14 ENCOUNTER — Encounter: Payer: Self-pay | Admitting: Nurse Practitioner

## 2020-12-14 VITALS — BP 144/80 | HR 88 | Ht 64.0 in | Wt 145.0 lb

## 2020-12-14 DIAGNOSIS — I10 Essential (primary) hypertension: Secondary | ICD-10-CM | POA: Diagnosis not present

## 2020-12-14 DIAGNOSIS — Z9889 Other specified postprocedural states: Secondary | ICD-10-CM

## 2020-12-14 DIAGNOSIS — Z79899 Other long term (current) drug therapy: Secondary | ICD-10-CM

## 2020-12-14 DIAGNOSIS — I4819 Other persistent atrial fibrillation: Secondary | ICD-10-CM | POA: Diagnosis not present

## 2020-12-14 DIAGNOSIS — I480 Paroxysmal atrial fibrillation: Secondary | ICD-10-CM

## 2020-12-14 DIAGNOSIS — Z7901 Long term (current) use of anticoagulants: Secondary | ICD-10-CM

## 2020-12-14 MED ORDER — FUROSEMIDE 20 MG PO TABS: ORAL_TABLET | ORAL | 3 refills | Status: DC

## 2020-12-14 NOTE — Patient Instructions (Addendum)
After Visit Summary:  We will be checking the following labs today - BMET & CBC   Medication Instructions:    Continue with your current medicines.   I refilled the Lasix today.    If you need a refill on your cardiac medications before your next appointment, please call your pharmacy.     Testing/Procedures To Be Arranged:  N/A  Follow-Up:   See Molly Tucker, [place] in one month with EKG and then see her again in June with Molly Tucker.     At [facility], you and your health needs are our priority.  As part of our continuing mission to provide you with exceptional heart care, we have created designated Provider Care Teams.  These Care Teams include your primary Cardiologist (physician) and Advanced Practice Providers (APPs -  Physician Assistants and Nurse Practitioners) who all work together to provide you with the care you need, when you need it.  Special Instructions:  . Stay safe, wash your hands for at least 20 seconds and wear a mask when needed.  . It was good to talk with you today.    Call the [place] office at [phone] if you have any questions, problems or concerns.

## 2020-12-15 ENCOUNTER — Ambulatory Visit
Admission: RE | Admit: 2020-12-15 | Discharge: 2020-12-15 | Disposition: A | Discharge status: Home / Self Care | Payer: Medicare Other | Source: Ambulatory Visit | Attending: Radiation Oncology | Admitting: Radiation Oncology

## 2020-12-15 ENCOUNTER — Other Ambulatory Visit: Payer: Self-pay

## 2020-12-15 ENCOUNTER — Encounter: Payer: Self-pay | Admitting: Radiation Oncology

## 2020-12-15 VITALS — Ht 64.0 in | Wt 145.0 lb

## 2020-12-15 DIAGNOSIS — Z170 Estrogen receptor positive status [ER+]: Secondary | ICD-10-CM

## 2020-12-15 DIAGNOSIS — C50312 Malignant neoplasm of lower-inner quadrant of left female breast: Secondary | ICD-10-CM | POA: Diagnosis not present

## 2020-12-15 LAB — CBC
Hematocrit: 42.1 % (ref 34.0–46.6)
Hemoglobin: 13.8 g/dL (ref 11.1–15.9)
MCH: 30.5 pg (ref 26.6–33.0)
MCHC: 32.8 g/dL (ref 31.5–35.7)
MCV: 93 fL (ref 79–97)
Platelets: 180 10*3/uL (ref 150–450)
RBC: 4.53 x10E6/uL (ref 3.77–5.28)
RDW: 13 % (ref 11.7–15.4)
WBC: 5.5 10*3/uL (ref 3.4–10.8)

## 2020-12-15 LAB — BASIC METABOLIC PANEL
BUN/Creatinine Ratio: 16 (ref 12–28)
BUN: 14 mg/dL (ref 8–27)
CO2: 22 mmol/L (ref 20–29)
Calcium: 9.1 mg/dL (ref 8.7–10.3)
Chloride: 104 mmol/L (ref 96–106)
Creatinine, Ser: 0.89 mg/dL (ref 0.57–1.00)
GFR calc Af Amer: 68 mL/min/{1.73_m2} (ref >59)
GFR calc non Af Amer: 59 mL/min/{1.73_m2} — ABNORMAL LOW (ref >59)
Glucose: 79 mg/dL (ref 65–99)
Potassium: 4.6 mmol/L (ref 3.5–5.2)
Sodium: 141 mmol/L (ref 134–144)

## 2020-12-15 NOTE — Progress Notes (Signed)
New Breast Cancer Diagnosis: Left Breast- LIQ  Did patient present with symptoms (if so, please note symptoms) or screening mammography?:Palpable mass .  Patient states she remembers this area being there about 10 years ago at her last mammogram.   Location and Extent of disease :left breast. Located at 8 o'clock position, measured  2.3 cm in greatest dimension. Adenopathy no.  Histology per Pathology Report: 11/30/2020 grade 1-2, Invasive Ductal Carcinoma  Receptor Status: ER(positive), PR (positive), Her2-neu (negative), Ki-(5%)  Surgeon and surgical plan, if any: Dr. Barry Dienes - Left Breast Lumpectomy with SLN biopsy 12/23/2020  Medical oncologist, treatment if any:   Dr. Jana Hakim 01/05/2021  Lymphedema issues, if any:  no  Pain issues, if any:  Some tenderness and bruising at the biopsy site.  SAFETY ISSUES: Prior radiation? No Pacemaker/ICD? No Possible current pregnancy? Postmenopausal Is the patient on methotrexate? No  Current Complaints / other details:   -On Eliquis for Afib

## 2020-12-15 NOTE — Progress Notes (Signed)
Radiation Oncology         ([phone]) [phone] ________________________________  Initial Outpatient Consultation - Conducted via telephone due to current COVID-19 concerns for limiting patient exposure  I spoke with the patient to conduct this consult visit via telephone to spare the patient unnecessary potential exposure in the healthcare setting during the current COVID-19 pandemic. The patient was notified in advance and was offered a WebEX meeting to allow for face to face communication but unfortunately reported that they did not have the appropriate resources/technology to support such a visit and instead preferred to proceed with a telephone consult.    Name: Molly Tucker        MRN: [id number]  Date of Service: 12/15/2020 DOB: [birth year]  FE:PHFYV, Molly Mo, MD  Almond Lint, MD     REFERRING PHYSICIAN: Almond Lint, MD   DIAGNOSIS: The encounter diagnosis was Malignant neoplasm of lower-inner quadrant of left breast in female, estrogen receptor positive (HCC).   HISTORY OF PRESENT ILLNESS: Molly Tucker is a 87 y.o. female with a newly diagnosed left breast cancer.  The patient noted a palpable mass in her left breast and has had a family history of breast cancer in her sister.  She proceeded with diagnostic imaging which revealed a mass in the left breast at the 8 o'clock position measuring up to 2.3 cm in greatest dimension.  Her axilla was negative by ultrasound.  She did undergo a biopsy on 11/30/2020 which revealed a grade 1-2 invasive ductal carcinoma with extracellular mucin and associated DCIS, her cancer was ER/PR positive, HER-2 was negative, and Ki-67 was 5%.  Despite her age on paper she has been to see Dr. Donell Beers who plans lumpectomy sentinel node given her robust performance status.  She is contacted today to discuss the options of adjuvant treatment, and plans to meet with Dr. Darnelle Catalan on 01/05/2021.   PREVIOUS RADIATION THERAPY: No   PAST MEDICAL HISTORY:  Past Medical  History:  Diagnosis Date  . Atrial tachycardia (HCC)   . Carotid bruit 2005   right, carotid doppler: <39% occlusion  . Hyperlipidemia    LDL goal =<120  . Hyperplastic colonic polyp 2006   Dr. Leone Payor, due 2016  . Hypertension   . Osteoporosis    S/P  biphosphonates x 5 yrs  . Persistent atrial fibrillation (HCC)        PAST SURGICAL HISTORY: Past Surgical History:  Procedure Laterality Date  . CARDIOVERSION N/A 03/31/2015   Procedure: CARDIOVERSION;  Surgeon: Thurmon Fair, MD;  Location: [facility] ENDOSCOPY;  Service: Cardiovascular;  Laterality: N/A;  . CARDIOVERSION N/A 12/21/2015   Procedure: CARDIOVERSION;  Surgeon: Lewayne Bunting, MD;  Location: [facility] ENDOSCOPY;  Service: Cardiovascular;  Laterality: N/A;  . CARDIOVERSION N/A 01/09/2017   Procedure: CARDIOVERSION;  Surgeon: Jake Bathe, MD;  Location: [facility] ENDOSCOPY;  Service: Cardiovascular;  Laterality: N/A;  . CARDIOVERSION N/A 07/09/2019   Procedure: CARDIOVERSION;  Surgeon: Thurmon Fair, MD;  Location: [facility] ENDOSCOPY;  Service: Cardiovascular;  Laterality: N/A;  . CARDIOVERSION N/A 01/14/2020   Procedure: CARDIOVERSION;  Surgeon: Sande Rives, MD;  Location: [facility] ENDOSCOPY;  Service: Cardiovascular;  Laterality: N/A;  . CARDIOVERSION N/A 02/23/2020   Procedure: CARDIOVERSION;  Surgeon: Jake Bathe, MD;  Location: [facility] ENDOSCOPY;  Service: Cardiovascular;  Laterality: N/A;  . COLONOSCOPY W/ POLYPECTOMY  2006  . DILATION AND CURETTAGE OF UTERUS    . MOUTH SURGERY  2009-2010   implants, Dr. Dereck Leep     FAMILY HISTORY:  Family History  Problem Relation Age of Onset  . Coronary artery disease Father        MI in 58s  . Cancer Sister        breast  . Coronary artery disease Sister        stent 2009  . Cancer Paternal Aunt        colon, possible breast cancer  . Arthritis Other        aunts  . Stroke Neg Hx      SOCIAL HISTORY:  reports that she quit smoking about 44 years ago. She has never used smokeless  tobacco. She reports that she does not drink alcohol and does not use drugs.  Patient is married and lives in [place].  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 She has a son who lives with her now.   ALLERGIES: Patient has no known allergies.   MEDICATIONS:  Current Outpatient Medications  Medication Sig Dispense Refill  . acetaminophen (TYLENOL) 500 MG tablet Take 500 mg by mouth every 6 (six) hours as needed for moderate pain.    Marland Kitchen amiodarone (PACERONE) 200 MG tablet Take 1 tablet (200 mg total) by mouth daily. 90 tablet 3  . ELIQUIS 5 MG TABS tablet TAKE 1 TABLET BY MOUTH  TWICE DAILY (Patient taking differently: Take 5 mg by mouth 2 (two) times daily.) 180 tablet 1  . furosemide (LASIX) 20 MG tablet Take one tablet by mouth ( 20 mg) daily (Patient taking differently: Take 20 mg by mouth daily.) 90 tablet 3  . metoprolol succinate (TOPROL XL) 25 MG 24 hr tablet Take 1 tablet (25 mg total) by mouth daily. 90 tablet 3   No current facility-administered medications for this encounter.     REVIEW OF SYSTEMS: On review of systems, the patient reports she is having some bruising since the biopsy. She reports that she is doing well overall.      PHYSICAL EXAM:  Wt Readings from Last 3 Encounters:  12/14/20 145 lb (65.8 kg)  11/08/20 141 lb (64 kg)  09/10/20 136 lb 7.4 oz (61.9 kg)   Unable to assess given encounter type   ECOG = 0  0 - Asymptomatic (Fully active, able to carry on all predisease activities without restriction)  1 - Symptomatic but completely ambulatory (Restricted in physically strenuous activity but ambulatory and able to carry out work of a light or sedentary nature. For example, light housework, office work)  2 - Symptomatic, <50% in bed during the day (Ambulatory and capable of all self care but unable to carry out any work activities. Up and about more than 50% of waking hours)  3 - Symptomatic, >50% in  bed, but not bedbound (Capable of only limited self-care, confined to bed or chair 50% or more of waking hours)  4 - Bedbound (Completely disabled. Cannot carry on any self-care. Totally confined to bed or chair)  5 - Death   Eustace Pen MM, Creech RH, Tormey DC, et al. [phone]). "Toxicity and response criteria of the [facility]". [place] Oncol. 5 (6): 649-55    LABORATORY DATA:  Lab Results  Component Value Date   WBC 5.6 11/08/2020   HGB 13.8 11/08/2020   HCT 43.3 11/08/2020   MCV 94 11/08/2020   PLT 201 11/08/2020   Lab Results  Component Value Date   NA 143 11/08/2020   K 4.5 11/08/2020  CL 104 11/08/2020   CO2 26 11/08/2020   Lab Results  Component Value Date   ALT 14 11/08/2020   AST 19 11/08/2020   ALKPHOS 49 11/08/2020   BILITOT 0.7 11/08/2020      RADIOGRAPHY: MM CLIP PLACEMENT LEFT  Result Date: 11/30/2020 CLINICAL DATA:  Status post ultrasound-guided biopsy of a subareolar mass in the LEFT breast. EXAM: DIAGNOSTIC LEFT MAMMOGRAM POST ULTRASOUND BIOPSY COMPARISON:  Previous exam(s). FINDINGS: Mammographic images were obtained following ultrasound guided biopsy of the subareolar LEFT breast mass. The biopsy marking clip is in expected position at the site of biopsy. IMPRESSION: Appropriate positioning of the ribbon shaped biopsy marking clip at the site of biopsy in the subareolar LEFT breast. Final Assessment: Post Procedure Mammograms for Marker Placement Electronically Signed   By: Franki Cabot M.D.   On: 11/30/2020 14:16   [place] LT BREAST BX W LOC DEV 1ST LESION IMG BX SPEC US GUIDE  Addendum Date: 12/01/2020   ADDENDUM REPORT: 12/01/2020 12:31 ADDENDUM: Pathology revealed GRADE I-II INVASIVE DUCTAL CARCINOMA WITH EXTRACELLULAR MUCIN, DUCTAL CARCINOMA IN SITU of the Left breast, subareolar . This was found to be concordant by Dr. Franki Cabot. Pathology results were discussed with the patient by telephone. The patient reported doing well after  the biopsy with tenderness at the site. Post biopsy instructions and care were reviewed and questions were answered. The patient was encouraged to call [facility] for any additional concerns. My direct phone number was provided. Surgical consultation has been arranged with Dr. Stark Klein at [facility] on December 06, 2020. Pathology results reported by Terie Purser, RN on 12/01/2020. Electronically Signed   By: Franki Cabot M.D.   On: 12/01/2020 12:31   Result Date: 12/01/2020 CLINICAL DATA:  Patient presents today for ultrasound-guided core biopsy of a retroareolar LEFT breast mass. EXAM: ULTRASOUND GUIDED LEFT BREAST CORE NEEDLE BIOPSY COMPARISON:  Previous exam(s). PROCEDURE: I met with the patient and we discussed the procedure of ultrasound-guided biopsy, including benefits and alternatives. We discussed the high likelihood of a successful procedure. We discussed the risks of the procedure, including infection, bleeding, tissue injury, clip migration, and inadequate sampling. Informed written consent was given. The usual time-out protocol was performed immediately prior to the procedure. Lesion quadrant: Subareolar Using sterile technique and 1% Lidocaine as local anesthetic, under direct ultrasound visualization, a 12 gauge spring-loaded device was used to perform biopsy of the irregular mass within the subareolar LEFT breast using a inferior approach. At the conclusion of the procedure ribbon shaped tissue marker clip was deployed into the biopsy cavity. Follow up 2 view mammogram was performed and dictated separately. IMPRESSION: Ultrasound guided biopsy of the subareolar LEFT breast mass. No apparent complications. Electronically Signed: By: Franki Cabot M.D. On: 11/30/2020 14:03       IMPRESSION/PLAN: 1. Stage IB, cT2N0M0 grade 1-2 ER/PR positive invasive ductal carcinoma of the left breast.  Again despite her age, she was felt to be robust when it comes to  performance status and is interested in remaining aggressive about her care.  We discussed her pathology results as well as our discussion about her case and multidisciplinary conference.  During this discussion it was felt that she would be a good candidate for breast conservation surgery with the possibility of utilizing antiestrogen versus adjuvant radiotherapy to the breast versus both.  We discussed the nature of radiotherapy and the goals of treatment would be to reduce risks of local recurrence with  curative intent.  We discussed the risks, benefits, short and long-term effects of radiotherapy as well as the delivery.  If the patient were to proceed and provided that her lymph nodes are negative on final pathology she would be offered a course of 4 weeks of adjuvant radiotherapy.  We would like to meet back with the patient a few weeks after her surgery to make more concrete plans for how she would like to move forward.  At the end of the conversation however she is interested in proceeding.   Given current concerns for patient exposure during the COVID-19 pandemic, this encounter was conducted via telephone.  The patient has provided two factor identification and has given verbal consent for this type of encounter and has been advised to only accept a meeting of this type in a secure network environment. The time spent during this encounter was 60 minutes including preparation, discussion, and coordination of the patient's care. The attendants for this meeting include Blenda Nicely, RN, Dr. Lisbeth Renshaw, Hayden Pedro  and [name].  During the encounter,  Blenda Nicely, RN and Dr. Lisbeth Renshaw were located at [facility] Radiation Oncology Department.  Shona Simpson was located remotely at home RUPINDER LIVINGSTON was located at home.    The above documentation reflects my direct findings during this shared patient visit. Please see the separate note by Dr. Lisbeth Renshaw on this date for the  remainder of the patient's plan of care.    Carola Rhine, [facility]    **Disclaimer: This note was dictated with voice recognition software. Similar sounding words can inadvertently be transcribed and this note may contain transcription errors which may not have been corrected upon publication of note.**

## 2020-12-16 ENCOUNTER — Ambulatory Visit: Payer: Medicare Other | Admitting: Radiation Oncology

## 2020-12-16 ENCOUNTER — Ambulatory Visit: Payer: Medicare Other

## 2020-12-16 ENCOUNTER — Encounter: Payer: Self-pay | Admitting: Genetic Counselor

## 2020-12-16 ENCOUNTER — Inpatient Hospital Stay: Payer: Medicare Other | Attending: Oncology | Admitting: Genetic Counselor

## 2020-12-16 ENCOUNTER — Other Ambulatory Visit: Payer: Medicare Other

## 2020-12-16 ENCOUNTER — Other Ambulatory Visit: Payer: Self-pay

## 2020-12-16 ENCOUNTER — Other Ambulatory Visit: Payer: Self-pay | Admitting: Genetic Counselor

## 2020-12-16 DIAGNOSIS — Z170 Estrogen receptor positive status [ER+]: Secondary | ICD-10-CM

## 2020-12-16 DIAGNOSIS — Z803 Family history of malignant neoplasm of breast: Secondary | ICD-10-CM | POA: Insufficient documentation

## 2020-12-16 DIAGNOSIS — C50312 Malignant neoplasm of lower-inner quadrant of left female breast: Secondary | ICD-10-CM

## 2020-12-16 DIAGNOSIS — Z800 Family history of malignant neoplasm of digestive organs: Secondary | ICD-10-CM | POA: Diagnosis not present

## 2020-12-16 NOTE — Progress Notes (Signed)
REFERRING PROVIDER: Stark Klein, MD [address] [place],  [place] [address]  PRIMARY PROVIDER:  Binnie Rail, MD  PRIMARY REASON FOR VISIT:  1. Malignant neoplasm of lower-inner quadrant of left breast in female, estrogen receptor positive ([place])   2. Family history of breast cancer   3. Family history of GI tract cancer   4. Family history of stomach cancer       HISTORY OF PRESENT ILLNESS:   Molly Tucker, a 87 y.o. female, was seen for a [place] cancer genetics consultation at the request of Dr. Barry Dienes due to a personal and family history of cancer.  Molly Tucker presents to clinic today to discuss the possibility of a hereditary predisposition to cancer, genetic testing, and to further clarify her future cancer risks, as well as potential cancer risks for family members.   In February of 2022, at the age of 41, Ms. Molly Tucker was diagnosed with invasive ductal carcinoma and ductal carcinoma in situ of the left breast. The tumor is ER+/PR+/Her2-. The treatment plan includes surgery (lumpectomy planned 12/23/20).   CANCER HISTORY:  Oncology History  Malignant neoplasm of lower-inner quadrant of left breast in female, estrogen receptor positive ([place])  11/30/2020 Cancer Staging   Staging form: Breast, AJCC 8th Edition - Clinical stage from 11/30/2020: Stage IB (cT2, cN0, cM0, G2, ER+, PR+, HER2-) - Signed by Gardenia Phlegm, NP on 12/08/2020 Stage prefix: Initial diagnosis   12/08/2020 Initial Diagnosis   Malignant neoplasm of lower-inner quadrant of left breast in female, estrogen receptor positive ([place])     RISK FACTORS:  Menarche was at age 72.  First live birth at age 67.  OCP use for more than 5 years.  Ovaries intact: yes.  Hysterectomy: no.  Menopausal status: postmenopausal.  HRT use: 0 years. Colonoscopy: yes; 08/04/2005. Mammogram within the last year: yes. Any excessive radiation exposure in the past: no.   Past Medical History:  Diagnosis Date  .  Atrial tachycardia ([place])   . Breast cancer ([place]) 11/30/2020  . Carotid bruit 2005   right, carotid doppler: <39% occlusion  . Family history of breast cancer   . Family history of GI tract cancer   . Family history of stomach cancer   . Hyperlipidemia    LDL goal =<120  . Hyperplastic colonic polyp 2006   Dr. Carlean Purl, due 2016  . Hypertension   . Osteoporosis    S/P  biphosphonates x 5 yrs  . Persistent atrial fibrillation [facility])     Past Surgical History:  Procedure Laterality Date  . CARDIOVERSION N/A 03/31/2015   Procedure: CARDIOVERSION;  Surgeon: Sanda Klein, MD;  Location: [place];  Service: Cardiovascular;  Laterality: N/A;  . CARDIOVERSION N/A 12/21/2015   Procedure: CARDIOVERSION;  Surgeon: Lelon Perla, MD;  Location: [facility] ENDOSCOPY;  Service: Cardiovascular;  Laterality: N/A;  . CARDIOVERSION N/A 01/09/2017   Procedure: CARDIOVERSION;  Surgeon: Jerline Pain, MD;  Location: [place];  Service: Cardiovascular;  Laterality: N/A;  . CARDIOVERSION N/A 07/09/2019   Procedure: CARDIOVERSION;  Surgeon: Sanda Klein, MD;  Location: [place] ENDOSCOPY;  Service: Cardiovascular;  Laterality: N/A;  . CARDIOVERSION N/A 01/14/2020   Procedure: CARDIOVERSION;  Surgeon: Geralynn Rile, MD;  Location: [place];  Service: Cardiovascular;  Laterality: N/A;  . CARDIOVERSION N/A 02/23/2020   Procedure: CARDIOVERSION;  Surgeon: Jerline Pain, MD;  Location: [facility] ENDOSCOPY;  Service: Cardiovascular;  Laterality: N/A;  . COLONOSCOPY W/ POLYPECTOMY  2006  . DILATION AND  CURETTAGE OF UTERUS    . MOUTH SURGERY  2009-2010   implants, Dr. Marcelyn Ditty    Social History   Socioeconomic History  . Marital status: Married    Spouse name: Not on file  . Number of children: Not on file  . Years of education: Not on file  . Highest education level: Not on file  Occupational History  . Occupation: Retired    Fish farm manager: [employer]  . Smoking status: Former Smoker    Quit  date: 10/30/1976    Years since quitting: 44.1  . Smokeless tobacco: Never Used  . Tobacco comment: Patient would ONLY smoke occasionally   Substance and Sexual Activity  . Alcohol use: No  . Drug use: No  . Sexual activity: Not on file  Other Topics Concern  . Not on file  Social History Narrative   Regular exercise: yes: walking 30 minute once daily   Social Determinants of Health   Financial Resource Strain: Low Risk   . Difficulty of Paying Living Expenses: Not hard at all  Food Insecurity: No Food Insecurity  . Worried About Charity fundraiser in the Last Year: Never true  . Ran Out of Food in the Last Year: Never true  Transportation Needs: No Transportation Needs  . Lack of Transportation (Medical): No  . Lack of Transportation (Non-Medical): No  Physical Activity: Sufficiently Active  . Days of Exercise per Week: 5 days  . Minutes of Exercise per Session: 30 min  Stress: No Stress Concern Present  . Feeling of Stress : Not at all  Social Connections: Socially Integrated  . Frequency of Communication with Friends and Family: More than three times a week  . Frequency of Social Gatherings with Friends and Family: More than three times a week  . Attends Religious Services: More than 4 times per year  . Active Member of Clubs or Organizations: Yes  . Attends Archivist Meetings: More than 4 times per year  . Marital Status: Married     FAMILY HISTORY:  We obtained a detailed, 4-generation family history.  Significant diagnoses are listed below: Family History  Problem Relation Age of Onset  . Coronary artery disease Father        MI in 46s  . Coronary artery disease Sister        stent 2009  . Breast cancer Sister 22       bilateral, diagnosed in other breast at age 66  . Cancer Paternal Aunt        colon, possible breast cancer, dx 50s/60s  . Arthritis Other        aunts  . Stomach cancer Maternal Uncle        dx 55s  . Intellectual disability  Paternal Uncle   . Cancer Paternal Aunt        gastrointestinal, dx 50s/60s  . Stroke Neg Hx    Molly Tucker has two sons (ages 29 and 17). She had two brothers and four sisters. One sister was diagnosed with bilateral breast cancer - first in one breast at age 61, then in the other breast at age 77.  Ms. Stober mother died at age 5 without cancer. There were four maternal aunts and two maternal uncles. One uncle died from stomach cancer in his 41s. There is no known cancer among maternal cousins. Ms. Dragoo maternal grandmother died in her 41s without cancer. Her maternal grandfather died in his 33s without cancer.  Ms. Pepitone father died at age 39 without cancer. There were five paternal aunts and there was one paternal uncle. Two aunts died from some type of gastrointestinal cancer in their 78s or 79s. There is no known cancer among paternal cousins. Ms. Mowbray paternal grandparents died younger than 63, and she has limited information about their health.   Ms. Hakeem is unaware of previous family history of genetic testing for hereditary cancer risks. Patient's ancestors are of unknown descent. There is no reported Ashkenazi Jewish ancestry. There is no known consanguinity.  GENETIC COUNSELING ASSESSMENT: Ms. Sher is a 87 y.o. female with a personal history of breast cancer and a family history of breast cancer, stomach cancer, and GI tract cancer, which is somewhat suggestive of a hereditary cancer and predisposition to cancer. We, therefore, discussed and recommended the following at today's visit.   DISCUSSION: We discussed that approximately 5-10% of breast cancer is hereditary, with most cases associated with the BRCA1 and BRCA2 genes. There are other genes that can be associated with hereditary breast cancer syndromes. These include ATM, CHEK2, PALB2, etc. We discussed that testing is beneficial for several reasons, including knowing about other cancer risks, identifying potential  screening and risk-reduction options that may be appropriate, and to understand if other family members could be at risk for cancer and allow them to undergo genetic testing.  We reviewed the characteristics, features and inheritance patterns of hereditary cancer syndromes. We also discussed genetic testing, including the appropriate family members to test, the process of testing, insurance coverage and turn-around-time for results. We discussed the implications of a negative, positive and/or variant of uncertain significant result. We recommended Ms. Noyes pursue genetic testing for a hereditary cancer gene panel that includes known breast cancer and gastrointestinal cancer genes, such as the [facility] Common Hereditary Cancers panel.  Based on Ms. Macaulay personal and family history of cancer, she meets medical criteria for genetic testing. Despite that she meets criteria, there may still be an out of pocket cost.   PLAN: Ms. Wassel did not wish to pursue genetic testing at today's visit. We understand this decision and remain available to coordinate genetic testing at any time in the future. We, therefore, recommend Ms. Delancey continue to follow the cancer screening guidelines given by her primary healthcare provider.  Based on Ms. Harada family history, we recommended her sister, who was diagnosed with bilateral breast cancer first at age 30, have genetic counseling and testing. Ms. Bally will let us know if we can be of any assistance in coordinating genetic counseling and/or testing for this family member.   Lastly, we encouraged Ms. Bible to remain in contact with cancer genetics annually so that we can continuously update the family history and inform her of any changes in cancer genetics and testing that may be of benefit for this family.   Ms. Mcclimans questions were answered to her satisfaction today. Our contact information was provided should additional questions or concerns arise. Thank you for  the referral and allowing [place] to share in the care of your patient.   Clint Guy, [place], [facility] Licensed, Certified Dispensing [email or web address] Phone: [phone]  The patient was seen for a total of 30 minutes in face-to-face genetic counseling.  This patient was discussed with Drs. Magrinat, Lindi Adie and/or Burr Medico who agrees with the above.    _______________________________________________________________________ For Office Staff:  Number of people involved in session: 1 Was an Intern/ student involved with case: no

## 2020-12-16 NOTE — Pre-Procedure Instructions (Signed)
Surgical Instructions    Your procedure is scheduled on Thursday, February 24th.  Report to [facility] Main Entrance "A" at 8:30 A.M., then check in with the Admitting office.  Call this number if you have problems the morning of surgery:  [phone]   If you have any questions prior to your surgery date call [phone]: Open Monday-Friday 8am-4pm    Remember:  Do not eat after midnight the night before your surgery  You may drink clear liquids until 7:30 A.M. the morning of your surgery.   Clear liquids allowed are: Water, Non-Citrus Juices (without pulp), Carbonated Beverages, Clear Tea, Black Coffee Only, and Gatorade    Take these medicines the morning of surgery with A SIP OF WATER  amiodarone (PACERONE)  metoprolol succinate (TOPROL XL)  acetaminophen (TYLENOL) -use as needed.  Per your cardiologist, HOLD ELIQUIS 2 days prior to your surgery- (last dose 12/20/20).  As of today, STOP taking any Aspirin (unless otherwise instructed by your surgeon) Aleve, Naproxen, Ibuprofen, Motrin, Advil, Goody's, BC's, all herbal medications, fish oil, and all vitamins.                     Do not wear jewelry, make up, or nail polish            Do not wear lotions, powders, perfumes, or deodorant.            Do not shave 48 hours prior to surgery.             Do not bring valuables to the hospital.            [facility] is not responsible for any belongings or valuables.  Do NOT Smoke (Tobacco/Vaping) or drink Alcohol 24 hours prior to your procedure If you use a CPAP at night, you may bring all equipment for your overnight stay.   Contacts, glasses, dentures or bridgework may not be worn into surgery, please bring cases for these belongings   For patients admitted to the hospital, discharge time will be determined by your treatment team.   Patients discharged the day of surgery will not be allowed to drive home, and someone needs to stay with them for 24 hours.    Special  instructions:   [facility]- Preparing For Surgery  Before surgery, you can play an important role. Because skin is not sterile, your skin needs to be as free of germs as possible. You can reduce the number of germs on your skin by washing with CHG (chlorahexidine gluconate) Soap before surgery.  CHG is an antiseptic cleaner which kills germs and bonds with the skin to continue killing germs even after washing.    Oral Hygiene is also important to reduce your risk of infection.  Remember - BRUSH YOUR TEETH THE MORNING OF SURGERY WITH YOUR REGULAR TOOTHPASTE  Please do not use if you have an allergy to CHG or antibacterial soaps. If your skin becomes reddened/irritated stop using the CHG.  Do not shave (including legs and underarms) for at least 48 hours prior to first CHG shower. It is OK to shave your face.  Please follow these instructions carefully.   1. Shower the NIGHT BEFORE SURGERY and the MORNING OF SURGERY  2. If you chose to wash your hair, wash your hair first as usual with your normal shampoo.  3. After you shampoo, rinse your hair and body thoroughly to remove the shampoo.  4. Wash Face and genitals (private parts) with your  normal soap.   5.  Shower the NIGHT BEFORE SURGERY and the MORNING OF SURGERY with CHG Soap.   6. Use CHG Soap as you would any other liquid soap. You can apply CHG directly to the skin and wash gently with a scrungie or a clean washcloth.   7. Apply the CHG Soap to your body ONLY FROM THE NECK DOWN.  Do not use on open wounds or open sores. Avoid contact with your eyes, ears, mouth and genitals (private parts). Wash Face and genitals (private parts)  with your normal soap.   8. Wash thoroughly, paying special attention to the area where your surgery will be performed.  9. Thoroughly rinse your body with warm water from the neck down.  10. DO NOT shower/wash with your normal soap after using and rinsing off the CHG Soap.  11. Pat yourself dry with a  CLEAN TOWEL.  12. Wear CLEAN PAJAMAS to bed the night before surgery  13. Place CLEAN SHEETS on your bed the night before your surgery  14. DO NOT SLEEP WITH PETS.   Day of Surgery: Wear Clean/Comfortable clothing the morning of surgery Do not apply any deodorants/lotions.   Remember to brush your teeth WITH YOUR REGULAR TOOTHPASTE.   Please read over the following fact sheets that you were given.

## 2020-12-17 ENCOUNTER — Encounter (HOSPITAL_COMMUNITY)
Admission: RE | Admit: 2020-12-17 | Discharge: 2020-12-17 | Disposition: A | Discharge status: Home / Self Care | Payer: Medicare Other | Source: Ambulatory Visit | Attending: General Surgery | Admitting: General Surgery

## 2020-12-17 ENCOUNTER — Encounter (HOSPITAL_COMMUNITY): Payer: Self-pay

## 2020-12-17 ENCOUNTER — Other Ambulatory Visit: Payer: Self-pay

## 2020-12-17 HISTORY — DX: Other specified postprocedural states: R11.2

## 2020-12-17 HISTORY — DX: Unspecified osteoarthritis, unspecified site: M19.90

## 2020-12-17 HISTORY — DX: Cardiac arrhythmia, unspecified: I49.9

## 2020-12-17 HISTORY — DX: Other complications of anesthesia, initial encounter: T88.59XA

## 2020-12-17 HISTORY — DX: Other specified postprocedural states: Z98.890

## 2020-12-17 NOTE — Progress Notes (Signed)
PCP - Dr. Billey Gosling Cardiologist - [name], NP/ Dr. Kayleen Memos  Chest x-ray - n/a EKG - 12/16/20 Stress Test - denies ECHO - 03/08/20 Cardiac Cath - denies  Sleep Study - denies CPAP - denies  Blood Thinner Instructions: Hold Eliquis 2 days prior to procedure. LD 12/20/20. Aspirin Instructions:n/a  ERAS Protcol - Clear liquids until 0730 DOS. PRE-SURGERY Ensure or G2- no drink ordered   COVID TEST-12/20/20; pt aware to quarantine after testing.    Anesthesia review: Yes, heart history.  Patient denies shortness of breath, fever, cough and chest pain at PAT appointment   All instructions explained to the patient, with a verbal understanding of the material. Patient agrees to go over the instructions while at home for a better understanding. Patient also instructed to self quarantine after being tested for COVID-19. The opportunity to ask questions was provided.

## 2020-12-20 ENCOUNTER — Other Ambulatory Visit (HOSPITAL_COMMUNITY)
Admission: RE | Admit: 2020-12-20 | Discharge: 2020-12-20 | Disposition: A | Discharge status: Home / Self Care | Payer: Medicare Other | Source: Ambulatory Visit | Attending: General Surgery | Admitting: General Surgery

## 2020-12-20 DIAGNOSIS — Z20822 Contact with and (suspected) exposure to covid-19: Secondary | ICD-10-CM | POA: Diagnosis not present

## 2020-12-20 DIAGNOSIS — Z01812 Encounter for preprocedural laboratory examination: Secondary | ICD-10-CM | POA: Insufficient documentation

## 2020-12-20 LAB — SARS CORONAVIRUS 2 (TAT 6-24 HRS): SARS Coronavirus 2: NEGATIVE

## 2020-12-20 NOTE — Progress Notes (Signed)
Anesthesia Chart Review:  Case: [id number] Date/Time: 12/23/20 1015   Procedure: LEFT BREAST LUMPECTOMY WITH SENTINEL LYMPH NODE BX (Left Breast) - RNFA, PEC BLOCK; START TIME OF 1030 rm 2 per ivey   Anesthesia type: General   Pre-op diagnosis: LEFT BREAST CANCER   Location: [place] OR ROOM 02 / [facility]   Surgeons: Stark Klein, MD      DISCUSSION: Patient is an 87 year old female scheduled for the above procedure.   History includes former smoker (quit 10/30/76), post-operative N/V, HTH, HLD, afib/atrial tachycardia (s/p multiple DCCV since 2016), carotid bruit (2005), hyperplastic colonic polyp (2006), left breast cancer (11/30/20). 02/2020 echo showed LVEF 60-65%, mild MR, moderate TR, biatrial enlargement, moderate RVH, moderately elevated PASP, RVSP 58.1 mmHg.   Last cardiology visit 12/14/20 with [name], NP for afib follow-up and preoperative exam. She was in afib increased amiodarone ~ 09/2020. Patient wanting to consider another try at cardioversion in the future. She was felt "acceptable candidate" for left breast lumpectomy with permission to hold Eliquis for 2 days prior to surgery. Last Eliquis 12/20/20. She will have EP follow-up with Tyson Babinski, PA-C on 01/20/21 with EKG and discussion on whether to proceed with repeat cardioversion at that time.  12/20/20 preoperative COVID-19 test is in process. Anesthesia team to evaluate on the day of surgery.    VS: BP (!) 146/99   Pulse 94   Temp 36.5 C (Oral)   Resp 17   Ht 5\' 4"  (1.626 m)   Wt 64.5 kg   SpO2 100%   BMI 24.41 kg/m     PROVIDERS: Binnie Rail, MD is PCP  Adam Phenix, MD is EP cardiologist. Also sees [name], NP and Roderic Palau, NP [facility]).  Kyung Rudd, MD is RAD-ONC. She has upcoming appointment with Avelino Leeds, MD on 01/05/21.    LABS: Labs reviewed: Acceptable for surgery.--Labs are from 12/14/20, results include: Lab Results  Component Value Date   WBC 5.5 12/14/2020   HGB 13.8  12/14/2020   HCT 42.1 12/14/2020   PLT 180 12/14/2020   GLUCOSE 79 12/14/2020   ALT 14 11/08/2020   AST 19 11/08/2020   NA 141 12/14/2020   K 4.6 12/14/2020   CL 104 12/14/2020   CREATININE 0.89 12/14/2020   BUN 14 12/14/2020   CO2 22 12/14/2020   TSH 3.500 11/08/2020     EKG: 12/14/20 ([facility]): Afib at 88 bpm. Low QRS voltage.   CV: Echo 03/08/20: IMPRESSIONS  1. Left ventricular ejection fraction, by estimation, is 60 to 65%. The  left ventricle has normal function. The left ventricle has no regional  wall motion abnormalities. Left ventricular diastolic parameters are  consistent with Grade II diastolic  dysfunction (pseudonormalization). Elevated left ventricular end-diastolic  pressure.  2. Right ventricular systolic function is normal. The right ventricular  size is moderately enlarged. There is moderately elevated pulmonary artery  systolic pressure. The estimated right ventricular systolic pressure is  85.2 mmHg.  3. Left atrial size was moderately dilated.  4. Right atrial size was severely dilated.  5. The mitral valve is normal in structure. Mild mitral valve  regurgitation. No evidence of mitral stenosis.  6. Tricuspid valve regurgitation is moderate.  7. The aortic valve is tricuspid. Aortic valve regurgitation is not  visualized. Mild aortic valve sclerosis is present, with no evidence of  aortic valve stenosis.  8. The inferior vena cava is normal in size with <50% respiratory  variability, suggesting right atrial  pressure of 8 mmHg.    Past Medical History:  Diagnosis Date  . Arthritis   . Atrial tachycardia ([place])   . Breast cancer ([place]) 11/30/2020  . Carotid bruit 2005   right, carotid doppler: <39% occlusion  . Complication of anesthesia   . Dysrhythmia    Chronic A. Fib  . Family history of breast cancer   . Family history of GI tract cancer   . Family history of stomach cancer   . Hyperlipidemia    LDL goal =<120  .  Hyperplastic colonic polyp 2006   Dr. Carlean Purl, due 2016  . Hypertension   . Osteoporosis    S/P  biphosphonates x 5 yrs  . Persistent atrial fibrillation ([place])   . PONV (postoperative nausea and vomiting)     Past Surgical History:  Procedure Laterality Date  . CARDIOVERSION N/A 03/31/2015   Procedure: CARDIOVERSION;  Surgeon: Sanda Klein, MD;  Location: [place];  Service: Cardiovascular;  Laterality: N/A;  . CARDIOVERSION N/A 12/21/2015   Procedure: CARDIOVERSION;  Surgeon: Lelon Perla, MD;  Location: [facility] ENDOSCOPY;  Service: Cardiovascular;  Laterality: N/A;  . CARDIOVERSION N/A 01/09/2017   Procedure: CARDIOVERSION;  Surgeon: Jerline Pain, MD;  Location: [place];  Service: Cardiovascular;  Laterality: N/A;  . CARDIOVERSION N/A 07/09/2019   Procedure: CARDIOVERSION;  Surgeon: Sanda Klein, MD;  Location: [place] ENDOSCOPY;  Service: Cardiovascular;  Laterality: N/A;  . CARDIOVERSION N/A 01/14/2020   Procedure: CARDIOVERSION;  Surgeon: Geralynn Rile, MD;  Location: [place];  Service: Cardiovascular;  Laterality: N/A;  . CARDIOVERSION N/A 02/23/2020   Procedure: CARDIOVERSION;  Surgeon: Jerline Pain, MD;  Location: [facility] ENDOSCOPY;  Service: Cardiovascular;  Laterality: N/A;  . COLONOSCOPY W/ POLYPECTOMY  2006  . DILATION AND CURETTAGE OF UTERUS    . MOUTH SURGERY  2009-2010   implants, Dr. Marcelyn Ditty    MEDICATIONS: . acetaminophen (TYLENOL) 500 MG tablet  . amiodarone (PACERONE) 200 MG tablet  . ELIQUIS 5 MG TABS tablet  . furosemide (LASIX) 20 MG tablet  . metoprolol succinate (TOPROL XL) 25 MG 24 hr tablet   No current facility-administered medications for this encounter.    Myra Gianotti, PA-C Surgical Short Stay/Anesthesiology [facility] Phone [phone] [facility] Phone [phone] 12/20/2020 2:41 PM

## 2020-12-20 NOTE — Anesthesia Preprocedure Evaluation (Addendum)
Anesthesia Evaluation  Patient identified by MRN, date of birth, ID band Patient awake    Reviewed: Allergy & Precautions, NPO status , Patient's Chart, lab work & pertinent test results  History of Anesthesia Complications (+) PONV and history of anesthetic complications  Airway Mallampati: III  TM Distance: >3 FB Neck ROM: Full    Dental no notable dental hx. (+) Implants, Caps, Dental Advisory Given   Pulmonary former smoker,    Pulmonary exam normal breath sounds clear to auscultation       Cardiovascular hypertension, Pt. on home beta blockers Normal cardiovascular exam+ dysrhythmias Atrial Fibrillation  Rhythm:Regular Rate:Normal  ECG: rate 88   Neuro/Psych negative neurological ROS  negative psych ROS   GI/Hepatic negative GI ROS, Neg liver ROS,   Endo/Other  negative endocrine ROS  Renal/GU negative Renal ROS     Musculoskeletal  (+) Arthritis ,   Abdominal   Peds  Hematology negative hematology ROS (+)   Anesthesia Other Findings LEFT BREAST CANCER  Reproductive/Obstetrics                          Anesthesia Physical Anesthesia Plan  ASA: III  Anesthesia Plan: General and Regional   Post-op Pain Management: GA combined w/ Regional for post-op pain   Induction: Intravenous  PONV Risk Score and Plan: 4 or greater and Ondansetron, Dexamethasone and Treatment may vary due to age or medical condition  Airway Management Planned: LMA  Additional Equipment:   Intra-op Plan:   Post-operative Plan: Extubation in OR  Informed Consent: I have reviewed the patients History and Physical, chart, labs and discussed the procedure including the risks, benefits and alternatives for the proposed anesthesia with the patient or authorized representative who has indicated his/her understanding and acceptance.     Dental advisory given  Plan Discussed with: CRNA  Anesthesia Plan  Comments: (Reviewed PAT note written 12/20/2020 by Myra Gianotti, PA-C. )       Anesthesia Quick Evaluation

## 2020-12-22 ENCOUNTER — Ambulatory Visit: Payer: Medicare Other | Admitting: Nurse Practitioner

## 2020-12-23 ENCOUNTER — Other Ambulatory Visit: Payer: Self-pay

## 2020-12-23 ENCOUNTER — Encounter (HOSPITAL_COMMUNITY): Payer: Self-pay | Admitting: General Surgery

## 2020-12-23 ENCOUNTER — Encounter (HOSPITAL_COMMUNITY)
Admission: RE | Admit: 2020-12-23 | Discharge: 2020-12-23 | Disposition: A | Discharge status: Home / Self Care | Payer: Medicare Other | Source: Ambulatory Visit | Attending: General Surgery | Admitting: General Surgery

## 2020-12-23 ENCOUNTER — Ambulatory Visit (HOSPITAL_COMMUNITY)
Admission: RE | Admit: 2020-12-23 | Discharge: 2020-12-23 | Disposition: A | Discharge status: Home / Self Care | Payer: Medicare Other | Attending: General Surgery | Admitting: General Surgery

## 2020-12-23 ENCOUNTER — Encounter (HOSPITAL_COMMUNITY)
Admission: RE | Disposition: A | Discharge status: Home / Self Care | Payer: Self-pay | Source: Home / Self Care | Attending: General Surgery

## 2020-12-23 ENCOUNTER — Encounter (HOSPITAL_COMMUNITY): Payer: Medicare Other

## 2020-12-23 ENCOUNTER — Ambulatory Visit (HOSPITAL_COMMUNITY): Payer: Medicare Other | Admitting: Vascular Surgery

## 2020-12-23 ENCOUNTER — Ambulatory Visit (HOSPITAL_COMMUNITY): Payer: Medicare Other | Admitting: Anesthesiology

## 2020-12-23 DIAGNOSIS — D360 Benign neoplasm of lymph nodes: Secondary | ICD-10-CM | POA: Insufficient documentation

## 2020-12-23 DIAGNOSIS — Z7901 Long term (current) use of anticoagulants: Secondary | ICD-10-CM | POA: Insufficient documentation

## 2020-12-23 DIAGNOSIS — C50912 Malignant neoplasm of unspecified site of left female breast: Secondary | ICD-10-CM | POA: Diagnosis not present

## 2020-12-23 DIAGNOSIS — I4891 Unspecified atrial fibrillation: Secondary | ICD-10-CM | POA: Diagnosis not present

## 2020-12-23 DIAGNOSIS — G8918 Other acute postprocedural pain: Secondary | ICD-10-CM | POA: Diagnosis not present

## 2020-12-23 DIAGNOSIS — C50312 Malignant neoplasm of lower-inner quadrant of left female breast: Secondary | ICD-10-CM | POA: Diagnosis not present

## 2020-12-23 DIAGNOSIS — Z87891 Personal history of nicotine dependence: Secondary | ICD-10-CM | POA: Diagnosis not present

## 2020-12-23 DIAGNOSIS — Z170 Estrogen receptor positive status [ER+]: Secondary | ICD-10-CM | POA: Insufficient documentation

## 2020-12-23 DIAGNOSIS — Z803 Family history of malignant neoplasm of breast: Secondary | ICD-10-CM | POA: Insufficient documentation

## 2020-12-23 DIAGNOSIS — E785 Hyperlipidemia, unspecified: Secondary | ICD-10-CM | POA: Diagnosis not present

## 2020-12-23 HISTORY — PX: BREAST LUMPECTOMY WITH SENTINEL LYMPH NODE BIOPSY: SHX5597

## 2020-12-23 SURGERY — BREAST LUMPECTOMY WITH SENTINEL LYMPH NODE BX
Anesthesia: Regional | Site: Breast | Laterality: Left

## 2020-12-23 MED ORDER — PHENYLEPHRINE 40 MCG/ML (10ML) SYRINGE FOR IV PUSH (FOR BLOOD PRESSURE SUPPORT)
PREFILLED_SYRINGE | INTRAVENOUS | Status: AC
  Filled 2020-12-23: qty 20

## 2020-12-23 MED ORDER — CHLORHEXIDINE GLUCONATE CLOTH 2 % EX PADS: 6.0000 | MEDICATED_PAD | Freq: Once | CUTANEOUS | Status: DC

## 2020-12-23 MED ORDER — BUPIVACAINE HCL (PF) 0.25 % IJ SOLN
INTRAMUSCULAR | Status: AC
  Filled 2020-12-23: qty 30

## 2020-12-23 MED ORDER — DEXAMETHASONE SODIUM PHOSPHATE 10 MG/ML IJ SOLN
INTRAMUSCULAR | Status: AC
  Filled 2020-12-23: qty 1

## 2020-12-23 MED ORDER — LIDOCAINE 2% (20 MG/ML) 5 ML SYRINGE
INTRAMUSCULAR | Status: AC
  Filled 2020-12-23: qty 5

## 2020-12-23 MED ORDER — CEFAZOLIN SODIUM-DEXTROSE 2-4 GM/100ML-% IV SOLN
INTRAVENOUS | Status: AC
  Filled 2020-12-23: qty 100

## 2020-12-23 MED ORDER — FENTANYL CITRATE (PF) 100 MCG/2ML IJ SOLN: 25.0000 ug | INTRAMUSCULAR | Status: DC | PRN

## 2020-12-23 MED ORDER — LACTATED RINGERS IV SOLN: INTRAVENOUS | Status: DC

## 2020-12-23 MED ORDER — SODIUM CHLORIDE (PF) 0.9 % IJ SOLN
INTRAMUSCULAR | Status: AC
  Filled 2020-12-23: qty 10

## 2020-12-23 MED ORDER — ACETAMINOPHEN 500 MG PO TABS
ORAL_TABLET | ORAL | Status: AC
  Administered 2020-12-23: 1000 mg via ORAL
  Filled 2020-12-23: qty 2

## 2020-12-23 MED ORDER — DEXMEDETOMIDINE (PRECEDEX) IN NS 20 MCG/5ML (4 MCG/ML) IV SYRINGE
PREFILLED_SYRINGE | INTRAVENOUS | Status: DC | PRN
  Administered 2020-12-23: 4 ug via INTRAVENOUS
  Administered 2020-12-23: 8 ug via INTRAVENOUS

## 2020-12-23 MED ORDER — TECHNETIUM TC 99M TILMANOCEPT KIT
1.0000 | PACK | Freq: Once | INTRAVENOUS | Status: AC | PRN
  Administered 2020-12-23: 1 via INTRADERMAL

## 2020-12-23 MED ORDER — DEXMEDETOMIDINE (PRECEDEX) IN NS 20 MCG/5ML (4 MCG/ML) IV SYRINGE
PREFILLED_SYRINGE | INTRAVENOUS | Status: AC
  Filled 2020-12-23: qty 5

## 2020-12-23 MED ORDER — LIDOCAINE-EPINEPHRINE 1 %-1:100000 IJ SOLN
INTRAMUSCULAR | Status: DC | PRN
  Administered 2020-12-23: 10 mL

## 2020-12-23 MED ORDER — ROPIVACAINE HCL 7.5 MG/ML IJ SOLN
INTRAMUSCULAR | Status: DC | PRN
  Administered 2020-12-23: 20 mL via PERINEURAL

## 2020-12-23 MED ORDER — PHENYLEPHRINE HCL-NACL 10-0.9 MG/250ML-% IV SOLN
INTRAVENOUS | Status: DC | PRN
  Administered 2020-12-23: 50 ug/min via INTRAVENOUS

## 2020-12-23 MED ORDER — CHLORHEXIDINE GLUCONATE 0.12 % MT SOLN
OROMUCOSAL | Status: AC
  Administered 2020-12-23: 15 mL
  Filled 2020-12-23: qty 15

## 2020-12-23 MED ORDER — ONDANSETRON HCL 4 MG/2ML IJ SOLN
INTRAMUSCULAR | Status: AC
  Filled 2020-12-23: qty 2

## 2020-12-23 MED ORDER — FENTANYL CITRATE (PF) 250 MCG/5ML IJ SOLN
INTRAMUSCULAR | Status: AC
  Filled 2020-12-23: qty 5

## 2020-12-23 MED ORDER — LABETALOL HCL 5 MG/ML IV SOLN
10.0000 mg | INTRAVENOUS | Status: AC | PRN
  Administered 2020-12-23 (×2): 10 mg via INTRAVENOUS
  Filled 2020-12-23: qty 4

## 2020-12-23 MED ORDER — BUPIVACAINE HCL (PF) 0.25 % IJ SOLN
INTRAMUSCULAR | Status: DC | PRN
  Administered 2020-12-23: 10 mL

## 2020-12-23 MED ORDER — ONDANSETRON HCL 4 MG/2ML IJ SOLN: 4.0000 mg | Freq: Once | INTRAMUSCULAR | Status: DC | PRN

## 2020-12-23 MED ORDER — FENTANYL CITRATE (PF) 100 MCG/2ML IJ SOLN
INTRAMUSCULAR | Status: DC | PRN
  Administered 2020-12-23 (×2): 25 ug via INTRAVENOUS

## 2020-12-23 MED ORDER — DEXAMETHASONE SODIUM PHOSPHATE 4 MG/ML IJ SOLN
INTRAMUSCULAR | Status: DC | PRN
  Administered 2020-12-23: 4 mg via INTRAVENOUS

## 2020-12-23 MED ORDER — PHENYLEPHRINE 40 MCG/ML (10ML) SYRINGE FOR IV PUSH (FOR BLOOD PRESSURE SUPPORT)
PREFILLED_SYRINGE | INTRAVENOUS | Status: DC | PRN
  Administered 2020-12-23: 120 ug via INTRAVENOUS
  Administered 2020-12-23: 200 ug via INTRAVENOUS
  Administered 2020-12-23 (×4): 80 ug via INTRAVENOUS

## 2020-12-23 MED ORDER — LIDOCAINE HCL (CARDIAC) PF 100 MG/5ML IV SOSY
PREFILLED_SYRINGE | INTRAVENOUS | Status: DC | PRN
  Administered 2020-12-23: 40 mg via INTRAVENOUS

## 2020-12-23 MED ORDER — LIDOCAINE-EPINEPHRINE 1 %-1:100000 IJ SOLN
INTRAMUSCULAR | Status: AC
  Filled 2020-12-23: qty 1

## 2020-12-23 MED ORDER — TRAMADOL HCL 50 MG PO TABS: 50.0000 mg | ORAL_TABLET | Freq: Four times a day (QID) | ORAL | 0 refills | Status: DC | PRN

## 2020-12-23 MED ORDER — METHYLENE BLUE 0.5 % INJ SOLN
INTRAVENOUS | Status: AC
  Filled 2020-12-23: qty 10

## 2020-12-23 MED ORDER — FENTANYL CITRATE (PF) 100 MCG/2ML IJ SOLN
INTRAMUSCULAR | Status: AC
  Filled 2020-12-23: qty 2

## 2020-12-23 MED ORDER — ONDANSETRON HCL 4 MG/2ML IJ SOLN
INTRAMUSCULAR | Status: DC | PRN
  Administered 2020-12-23: 4 mg via INTRAVENOUS

## 2020-12-23 MED ORDER — 0.9 % SODIUM CHLORIDE (POUR BTL) OPTIME
TOPICAL | Status: DC | PRN
  Administered 2020-12-23: 1000 mL

## 2020-12-23 MED ORDER — PROPOFOL 10 MG/ML IV BOLUS
INTRAVENOUS | Status: AC
  Filled 2020-12-23: qty 20

## 2020-12-23 MED ORDER — CEFAZOLIN SODIUM-DEXTROSE 2-4 GM/100ML-% IV SOLN
2.0000 g | INTRAVENOUS | Status: AC
  Administered 2020-12-23: 2 g via INTRAVENOUS

## 2020-12-23 MED ORDER — ACETAMINOPHEN 500 MG PO TABS: 1000.0000 mg | ORAL_TABLET | ORAL | Status: AC

## 2020-12-23 MED ORDER — PROPOFOL 10 MG/ML IV BOLUS
INTRAVENOUS | Status: DC | PRN
  Administered 2020-12-23 (×2): 100 mg via INTRAVENOUS

## 2020-12-23 SURGICAL SUPPLY — 65 items
ADH SKN CLS APL DERMABOND .7 (GAUZE/BANDAGES/DRESSINGS) ×1
APL PRP STRL LF DISP 70% ISPRP (MISCELLANEOUS) ×1
BINDER BREAST LRG (GAUZE/BANDAGES/DRESSINGS) IMPLANT
BINDER BREAST XLRG (GAUZE/BANDAGES/DRESSINGS) ×1 IMPLANT
BIOPATCH RED 1 DISK 7.0 (GAUZE/BANDAGES/DRESSINGS) IMPLANT
BNDG COHESIVE 4X5 TAN STRL (GAUZE/BANDAGES/DRESSINGS) ×2 IMPLANT
CANISTER SUCT 3000ML PPV (MISCELLANEOUS) ×2 IMPLANT
CHLORAPREP W/TINT 26 (MISCELLANEOUS) ×2 IMPLANT
CLIP VESOCCLUDE LG 6/CT (CLIP) ×2 IMPLANT
CLIP VESOCCLUDE MED 6/CT (CLIP) ×4 IMPLANT
CLIP VESOCCLUDE SM WIDE 6/CT (CLIP) ×2 IMPLANT
CNTNR URN SCR LID CUP LEK RST (MISCELLANEOUS) ×1 IMPLANT
CONT SPEC 4OZ STRL OR WHT (MISCELLANEOUS) ×2
COVER PROBE W GEL 5X96 (DRAPES) ×2 IMPLANT
COVER SURGICAL LIGHT HANDLE (MISCELLANEOUS) ×2 IMPLANT
COVER WAND RF STERILE (DRAPES) ×1 IMPLANT
DERMABOND ADVANCED (GAUZE/BANDAGES/DRESSINGS) ×1
DERMABOND ADVANCED .7 DNX12 (GAUZE/BANDAGES/DRESSINGS) ×1 IMPLANT
DRAIN CHANNEL 19F RND (DRAIN) ×1 IMPLANT
DRSG PAD ABDOMINAL 8X10 ST (GAUZE/BANDAGES/DRESSINGS) ×2 IMPLANT
DRSG TEGADERM 4X4.75 (GAUZE/BANDAGES/DRESSINGS) ×1 IMPLANT
ELECT CAUTERY BLADE 6.4 (BLADE) ×2 IMPLANT
ELECT REM PT RETURN 9FT ADLT (ELECTROSURGICAL) ×2
ELECTRODE REM PT RTRN 9FT ADLT (ELECTROSURGICAL) ×1 IMPLANT
EVACUATOR SILICONE 100CC (DRAIN) ×2 IMPLANT
FILTER STRAW FLUID ASPIR (MISCELLANEOUS) IMPLANT
GAUZE SPONGE 4X4 12PLY STRL (GAUZE/BANDAGES/DRESSINGS) ×1 IMPLANT
GLOVE BIO SURGEON STRL SZ 6 (GLOVE) ×2 IMPLANT
GLOVE SURG UNDER LTX SZ6.5 (GLOVE) ×2 IMPLANT
GOWN STRL REUS W/ TWL LRG LVL3 (GOWN DISPOSABLE) ×1 IMPLANT
GOWN STRL REUS W/TWL 2XL LVL3 (GOWN DISPOSABLE) ×2 IMPLANT
GOWN STRL REUS W/TWL LRG LVL3 (GOWN DISPOSABLE) ×2
KIT BASIN OR (CUSTOM PROCEDURE TRAY) ×2 IMPLANT
KIT MARKER MARGIN INK (KITS) ×1 IMPLANT
KIT TURNOVER KIT B (KITS) ×2 IMPLANT
LIGHT WAVEGUIDE WIDE FLAT (MISCELLANEOUS) ×1 IMPLANT
MARKER SKIN DUAL TIP RULER LAB (MISCELLANEOUS) ×2 IMPLANT
NDL 18GX1X1/2 (RX/OR ONLY) (NEEDLE) IMPLANT
NDL HYPO 25GX1X1/2 BEV (NEEDLE) IMPLANT
NDL SPNL 22GX3.5 QUINCKE BK (NEEDLE) ×1 IMPLANT
NEEDLE 18GX1X1/2 (RX/OR ONLY) (NEEDLE) IMPLANT
NEEDLE HYPO 25GX1X1/2 BEV (NEEDLE) IMPLANT
NEEDLE SPNL 22GX3.5 QUINCKE BK (NEEDLE) ×2 IMPLANT
NS IRRIG 1000ML POUR BTL (IV SOLUTION) ×2 IMPLANT
PACK GENERAL/GYN (CUSTOM PROCEDURE TRAY) ×2 IMPLANT
PACK UNIVERSAL I (CUSTOM PROCEDURE TRAY) ×2 IMPLANT
PAD ARMBOARD 7.5X6 YLW CONV (MISCELLANEOUS) ×2 IMPLANT
PENCIL SMOKE EVACUATOR (MISCELLANEOUS) ×2 IMPLANT
SPECIMEN JAR X LARGE (MISCELLANEOUS) ×2 IMPLANT
STAPLER VISISTAT 35W (STAPLE) ×2 IMPLANT
STOCKINETTE IMPERVIOUS 9X36 MD (GAUZE/BANDAGES/DRESSINGS) ×2 IMPLANT
STRIP CLOSURE SKIN 1/2X4 (GAUZE/BANDAGES/DRESSINGS) ×2 IMPLANT
SUT ETHILON 2 0 FS 18 (SUTURE) ×1 IMPLANT
SUT MNCRL AB 4-0 PS2 18 (SUTURE) ×1 IMPLANT
SUT MON AB 4-0 PC3 18 (SUTURE) ×2 IMPLANT
SUT SILK 2 0 (SUTURE) ×2
SUT SILK 2 0 PERMA HAND 18 BK (SUTURE) ×2 IMPLANT
SUT SILK 2-0 18XBRD TIE 12 (SUTURE) ×1 IMPLANT
SUT VIC AB 2-0 SH 27 (SUTURE) ×2
SUT VIC AB 2-0 SH 27X BRD (SUTURE) IMPLANT
SUT VIC AB 3-0 SH 8-18 (SUTURE) ×2 IMPLANT
SYR 50ML LL SCALE MARK (SYRINGE) ×2 IMPLANT
SYR CONTROL 10ML LL (SYRINGE) IMPLANT
TOWEL GREEN STERILE (TOWEL DISPOSABLE) ×2 IMPLANT
TOWEL GREEN STERILE FF (TOWEL DISPOSABLE) ×2 IMPLANT

## 2020-12-23 NOTE — H&P (Signed)
[name] Appointment: 12/06/2020 3:00 PM Location: [facility] Patient #: [id number] DOB: [birth year] Married / Language: Cleophus Molt / Race: White Female   History of Present Illness Stark Klein MD; 12/06/2020 3:26 PM) The patient is a 87 year old female who presents with breast cancer. Pt is an 87 yo F who presented with breast cancer 10/2020. She had a palpable mass that was increasing in size. She hadn't had a mammogram in a while. She underwent diagnostic imaging that showed a 2.3 cm mass at 8 o'clock in the subareolar location that was fixed to the skin. Core needle biopsy was performed and showed grade 1-2 invasive ductal carcinoma, ER/PR strongly positive, her 2 negative, and Ki 67 5%. She had associated DCIS.   She hasn;'t had cancer before, but has family history of breast cancer in sister and multiple paternal aunts with cancer (colon and maybe breast).  She has atrial fibrillation and has had 6 cardioversions. She is in it now. She sees cardiology regularly and is on Eliquis. She has two sons.    Dx mammo/us 11/02/2020 ACR Breast Density Category b: There are scattered areas of fibroglandular density.  FINDINGS: There is a rounded, macrolobulated, circumscribed mass retroareolar left breast centered medially. This is causing focal outward bulging of the skin. No findings elsewhere in either breast suspicious for malignancy.  Mammographic images were processed with CAD.  On physical exam, the patient has an approximately 2.5 cm rounded, firm, palpable mass centered in the 8 o'clock retroareolar left breast. This appears fixed to the nipple beneath the skin. There is associated overlying dark red and blue discoloration of the skin. There are no palpable left axillary lymph nodes.  Targeted ultrasound is performed, showing a 2.3 x 2.0 x 1.3 cm irregular, lobular, medium echotexture mass within the skin and underlying breast tissue in the 8 o'clock  retroareolar left breast, corresponding to the palpable mass. This has internal blood flow with power Doppler.  Ultrasound of the left axilla demonstrated normal appearing left axillary lymph nodes.  IMPRESSION: 2.3 cm indeterminate mass in the 8 o'clock retroareolar left breast, involving the skin and underlying breast.  RECOMMENDATION: Ultrasound-guided core needle biopsy of the 2.3 cm mass in the 8 o'clock retroareolar left breast. This has been discussed the patient and scheduled at 1:45 p.m. on 11/17/2020.  I have discussed the findings and recommendations with the patient. If applicable, a reminder letter will be sent to the patient regarding the next appointment.  BI-RADS CATEGORY 4: Suspicious.  pathology 11/30/2020 INVASIVE DUCTAL CARCINOMA WITH EXTRACELLULAR MUCIN - DUCTAL CARCINOMA IN SITU The tumor cells are NEGATIVE for Her2 (1+). Estrogen Receptor: 100%, POSITIVE, STRONG STAINING INTENSITY Progesterone Receptor: 100%, POSITIVE, STRONG STAINING INTENSITY Proliferation Marker Ki67: 5%   Past Surgical History Hortencia Conradi, CMA; 12/06/2020 3:03 PM) Cataract Surgery  Bilateral.  Diagnostic Studies History Sherron Ales Marshall-McBride, CMA; 12/06/2020 3:03 PM) Colonoscopy  >10 years ago Mammogram  within last year Pap Smear  >5 years ago  Allergies (Kheana Marshall-McBride, CMA; 12/06/2020 3:04 PM) No Known Drug Allergies  [12/06/2020]: No Known Allergies  [12/06/2020]: Allergies Reconciled   Medication History (Kheana Marshall-McBride, CMA; 12/06/2020 3:04 PM) Amiodarone HCl (200MG Tablet, Oral) Active. Eliquis (5MG Tablet, Oral) Active. Metoprolol Succinate ER (25MG Tablet ER 24HR, Oral) Active. Medications Reconciled  Social History Hortencia Conradi, CMA; 12/06/2020 3:04 PM) No alcohol use  Tobacco use  Former smoker.  Family History Hortencia Conradi, CMA; 12/06/2020 3:04 PM) Arthritis  Mother, Sister. Breast Cancer   Sister.  Heart Disease  Father. Melanoma  Sister.  Pregnancy / Birth History Hortencia Conradi, CMA; 12/06/2020 3:04 PM) Age at menarche  20 years. Contraceptive History  Oral contraceptives. Gravida  3 Maternal age  49-25 Para  2  Other Problems Hortencia Conradi, CMA; 12/06/2020 3:04 PM) Arthritis  Atrial Fibrillation  Breast Cancer  Lump In Breast     Review of Systems (Kheana Marshall-McBride CMA; 12/06/2020 3:04 PM) General Present- Fatigue. Not Present- Appetite Loss, Chills, Fever, Night Sweats, Weight Gain and Weight Loss. Skin Not Present- Change in Wart/Mole, Dryness, Hives, Jaundice, New Lesions, Non-Healing Wounds, Rash and Ulcer. HEENT Present- Wears glasses/contact lenses. Not Present- Earache, Hearing Loss, Hoarseness, Nose Bleed, Oral Ulcers, Ringing in the Ears, Seasonal Allergies, Sinus Pain, Sore Throat, Visual Disturbances and Yellow Eyes. Breast Present- Breast Mass and Skin Changes. Not Present- Breast Pain and Nipple Discharge. Cardiovascular Present- Leg Cramps. Not Present- Chest Pain, Difficulty Breathing Lying Down, Palpitations, Rapid Heart Rate, Shortness of Breath and Swelling of Extremities. Female Genitourinary Present- Nocturia. Not Present- Frequency, Painful Urination, Pelvic Pain and Urgency. Neurological Not Present- Decreased Memory, Fainting, Headaches, Numbness, Seizures, Tingling, Tremor, Trouble walking and Weakness. Psychiatric Not Present- Anxiety, Bipolar, Change in Sleep Pattern, Depression, Fearful and Frequent crying. Endocrine Not Present- Cold Intolerance, Excessive Hunger, Hair Changes, Heat Intolerance, Hot flashes and New Diabetes. Hematology Present- Blood Thinners and Easy Bruising. Not Present- Excessive bleeding, Gland problems, HIV and Persistent Infections.  Vitals (Kheana Marshall-McBride CMA; 12/06/2020 3:05 PM) 12/06/2020 3:04 PM Weight: 144.5 lb Height: 64in Body Surface Area: 1.7 m Body Mass Index:  24.8 kg/m  Temp.: 97.20F  Pulse: 97 (Regular)  P.OX: 95% (Room air) BP: 132/84(Sitting, Left Arm, Standard)       Physical Exam Stark Klein MD; 12/06/2020 3:28 PM) General Mental Status-Alert. General Appearance-Consistent with stated age. Hydration-Well hydrated. Voice-Normal.  Head and Neck Head-normocephalic, atraumatic with no lesions or palpable masses. Trachea-midline. Thyroid Gland Characteristics - normal size and consistency.  Eye Eyeball - Bilateral-Extraocular movements intact. Sclera/Conjunctiva - Bilateral-No scleral icterus.  Chest and Lung Exam Chest and lung exam reveals -quiet, even and easy respiratory effort with no use of accessory muscles and on auscultation, normal breath sounds, no adventitious sounds and normal vocal resonance. Inspection Chest Wall - Normal. Back - normal.  Breast Note: significant bruising on left lower breast. palpable mass 2-3 cm at lower aspect of nipple/areolar complex wtih mild nipple retraction. no other palpable masses. left breast sl larger. No palpable adenopathy. right breast normal. Moderate ptosis.   Cardiovascular Cardiovascular examination reveals -normal heart sounds, regular rate and rhythm with no murmurs and normal pedal pulses bilaterally.  Abdomen Inspection Inspection of the abdomen reveals - No Hernias. Palpation/Percussion Palpation and Percussion of the abdomen reveal - Soft, Non Tender, No Rebound tenderness, No Rigidity (guarding) and No hepatosplenomegaly. Auscultation Auscultation of the abdomen reveals - Bowel sounds normal.  Neurologic Neurologic evaluation reveals -alert and oriented x 3 with no impairment of recent or remote memory. Mental Status-Normal.  Musculoskeletal Global Assessment -Note: no gross deformities.  Normal Exam - Left-Upper Extremity Strength Normal and Lower Extremity Strength Normal. Normal Exam - Right-Upper Extremity Strength  Normal and Lower Extremity Strength Normal.  Lymphatic Head & Neck  General Head & Neck Lymphatics: Bilateral - Description - Normal. Axillary  General Axillary Region: Bilateral - Description - Normal. Tenderness - Non Tender. Femoral & Inguinal  Generalized Femoral & Inguinal Lymphatics: Bilateral - Description - No Generalized lymphadenopathy.    Assessment & Plan Dorris Fetch  Mohini Heathcock MD; 12/06/2020 3:32 PM) MALIGNANT NEOPLASM OF LOWER-INNER QUADRANT OF LEFT BREAST IN FEMALE, ESTROGEN RECEPTOR POSITIVE (C50.312) Impression: Patient is very active and lives independently. Her husband had a CVA with right hemiparesis. she helps care for his ostomy.  Given her good health, I will plan lumpectomy and SLN bx. Her mass has been there for a while and has a higher chance of LN metastasis. If present, she would likely get recommended to have XRT.  I will make referrals to rad onc and med onc to see post op.  She does not need a seed as the mass is superficial and palpable. I will plan lumpectomy and SLN bx.  The surgical procedure was described to the patient. I discussed the incision type and location and that we would need radiology involved on with a wire or seed marker and/or sentinel node.  The risks and benefits of the procedure were described to the patient and she wishes to proceed.  We discussed the risks bleeding, infection, damage to other structures, need for further procedures/surgeries. We discussed the risk of seroma. The patient was advised if the area in the breast in cancer, we may need to go back to surgery for additional tissue to obtain negative margins or for a lymph node biopsy. The patient was advised that these are the most common complications, but that others can occur as well. They were advised against taking aspirin or other anti-inflammatory agents/blood thinners the week before surgery. Current Plans Pt Education - flb breast cancer surgery: discussed with patient and  provided information. Referred to Oncology, for evaluation and follow up (Oncology). Routine. Referred to Radiation Oncology, for evaluation and follow up (Radiation Oncology). Routine. You are being scheduled for surgery- Our schedulers will call you.  You should hear from our office's scheduling department within 5 working days about the location, date, and time of surgery. We try to make accommodations for patient's preferences in scheduling surgery, but sometimes the OR schedule or the surgeon's schedule prevents [place] from making those accommodations.  If you have not heard from our office [phone]) in 5 working days, call the office and ask for your surgeon's nurse.  If you have other questions about your diagnosis, plan, or surgery, call the office and ask for your surgeon's nurse.  FAMILY HISTORY OF BREAST CANCER (Z80.3) Impression: Refer to genetics. Current Plans Referred to Genetic Counseling, for evaluation and follow up (Medical Genetics). Routine. ON ANTICOAGULANT THERAPY (Z79.01) Impression: Have gotten word from cardiology that it is OK to hold eliquis 2 days pre op and no additional testing required for risk stratification. ATRIAL FIBRILLATION AND FLUTTER (I48.91) Impression: Managed by [name] and [name] of [facility] heart care.

## 2020-12-23 NOTE — Anesthesia Procedure Notes (Signed)
Procedure Name: LMA Insertion Date/Time: 12/23/2020 11:17 AM Performed by: Jenne Campus, CRNA Pre-anesthesia Checklist: Patient identified, Emergency Drugs available, Suction available and Patient being monitored Patient Re-evaluated:Patient Re-evaluated prior to induction Oxygen Delivery Method: Circle System Utilized Preoxygenation: Pre-oxygenation with 100% oxygen Induction Type: IV induction Ventilation: Mask ventilation without difficulty LMA: LMA inserted LMA Size: 4.0 Number of attempts: 1 Airway Equipment and Method: Bite block Placement Confirmation: positive ETCO2 and breath sounds checked- equal and bilateral Tube secured with: Tape Dental Injury: Teeth and Oropharynx as per pre-operative assessment

## 2020-12-23 NOTE — Discharge Instructions (Addendum)
[facility] °Office Phone Number [phone] ° °BREAST BIOPSY/ PARTIAL MASTECTOMY: POST OP INSTRUCTIONS ° °Always review your discharge instruction sheet given to you by the facility where your surgery was performed. ° °IF YOU HAVE DISABILITY OR FAMILY LEAVE FORMS, YOU MUST BRING THEM TO THE OFFICE FOR PROCESSING.  DO NOT GIVE THEM TO YOUR DOCTOR. ° °1. A prescription for pain medication may be given to you upon discharge.  Take your pain medication as prescribed, if needed.  If narcotic pain medicine is not needed, then you may take acetaminophen (Tylenol) or ibuprofen (Advil) as needed. °2. Take your usually prescribed medications unless otherwise directed °3. If you need a refill on your pain medication, please contact your pharmacy.  They will contact our office to request authorization.  Prescriptions will not be filled after 5pm or on week-ends. °4. You should eat very light the first 24 hours after surgery, such as soup, crackers, pudding, etc.  Resume your normal diet the day after surgery. °5. Most patients will experience some swelling and bruising in the breast.  Ice packs and a good support bra will help.  Swelling and bruising can take several days to resolve.  °6. It is common to experience some constipation if taking pain medication after surgery.  Increasing fluid intake and taking a stool softener will usually help or prevent this problem from occurring.  A mild laxative (Milk of Magnesia or Miralax) should be taken according to package directions if there are no bowel movements after 48 hours. °7. Unless discharge instructions indicate otherwise, you may remove your bandages 48 hours after surgery, and you may shower at that time.  You may have steri-strips (small skin tapes) in place directly over the incision.  These strips should be left on the skin for 7-10 days.   Any sutures or staples will be removed at the office during your follow-up visit. °8. ACTIVITIES:  You may resume  regular daily activities (gradually increasing) beginning the next day.  Wearing a good support bra or sports bra (or the breast binder) minimizes pain and swelling.  You may have sexual intercourse when it is comfortable. °a. You may drive when you no longer are taking prescription pain medication, you can comfortably wear a seatbelt, and you can safely maneuver your car and apply brakes. °b. RETURN TO WORK:  __________1 week_______________ °9. You should see your doctor in the office for a follow-up appointment approximately two weeks after your surgery.  Your doctor’s nurse will typically make your follow-up appointment when she calls you with your pathology report.  Expect your pathology report 2-3 business days after your surgery.  You may call to check if you do not hear from us after three days. ° ° °WHEN TO CALL YOUR DOCTOR: °1. Fever over 101.0 °2. Nausea and/or vomiting. °3. Extreme swelling or bruising. °4. Continued bleeding from incision. °5. Increased pain, redness, or drainage from the incision. ° °The clinic staff is available to answer your questions during regular business hours.  Please don’t hesitate to call and ask to speak to one of the nurses for clinical concerns.  If you have a medical emergency, go to the nearest emergency room or call 911.  A surgeon from [facility] is always on call at the hospital. ° °For further questions, please visit [email or web address]  ° °

## 2020-12-23 NOTE — Transfer of Care (Signed)
Immediate Anesthesia Transfer of Care Note  Patient: Molly Tucker  Procedure(s) Performed: LEFT BREAST LUMPECTOMY WITH SENTINEL LYMPH NODE BX (Left Breast)  Patient Location: PACU  Anesthesia Type:General  Level of Consciousness: awake, oriented and patient cooperative  Airway & Oxygen Therapy: Patient Spontanous Breathing and Patient connected to nasal cannula oxygen  Post-op Assessment: Report given to RN and Post -op Vital signs reviewed and stable  Post vital signs: Reviewed  Last Vitals:  Vitals Value Taken Time  BP 141/94 12/23/20 1320  Temp    Pulse 75 12/23/20 1326  Resp 15 12/23/20 1326  SpO2 98 % 12/23/20 1326  Vitals shown include unvalidated device data.  Last Pain:  Vitals:   12/23/20 1100  TempSrc:   PainSc: 0-No pain      Patients Stated Pain Goal: 3 (12/81/18 8677)  Complications: No complications documented.

## 2020-12-23 NOTE — Interval H&P Note (Signed)
History and Physical Interval Note:  12/23/2020 9:34 AM  Molly Tucker  has presented today for surgery, with the diagnosis of LEFT BREAST CANCER.  The various methods of treatment have been discussed with the patient and family. After consideration of risks, benefits and other options for treatment, the patient has consented to  Procedure(s) with comments: LEFT BREAST LUMPECTOMY WITH SENTINEL LYMPH NODE BX (Left)  as a surgical intervention.  The patient's history has been reviewed, patient examined.  I have reviewed the patient's chart and labs.    Wt Readings from Last 3 Encounters:  12/23/20 64.5 kg  12/17/20 64.5 kg  12/15/20 65.8 kg   Temp Readings from Last 3 Encounters:  12/23/20 98.9 F (37.2 C) (Oral)  12/17/20 97.7 F (36.5 C) (Oral)  09/10/20 98 F (36.7 C)   BP Readings from Last 3 Encounters:  12/23/20 (!) 201/143  12/17/20 (!) 146/99  12/14/20 (!) 144/80   Pulse Readings from Last 3 Encounters:  12/23/20 (!) 105  12/17/20 94  12/14/20 88     She is quite hypertensive today, especially the diastolic pressure.  Anesthesia is working on her blood pressure in order to get to a safe level for surgery.  If we cannot, we will need to delay surgery.  Questions were answered to the patient's satisfaction.     Stark Klein

## 2020-12-23 NOTE — Progress Notes (Signed)
Dr Winfield Cunas and Dr Barry Dienes had several conversations with patient and her sister Netta Cedars via speaker phone in [facility] room 26 (Janet's cell [phone]) concerning her elevated BP and risks for surgery.  All questions and concerns were answered.  Patient agreed to proceed with surgery.

## 2020-12-23 NOTE — Op Note (Signed)
Left Breast Lumpectomy with Sentinel Node Mapping and Biopsy Procedure Note  Indications: This patient presents with history of left breast cancer with clinically negative axillary lymph node exam.  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 The risks, benefits, complications, treatment options, and expected outcomes were discussed with the patient. The possibilities of reaction to medication, pulmonary aspiration, bleeding, infection, the need for additional procedures, failure to diagnose a condition, and creating a complication requiring transfusion or operation were discussed with the patient. The patient concurred with the proposed plan, giving informed consent.  The site of surgery properly noted/marked. The patient was taken to Operating Room # 3, identified as Molly Tucker and the procedure verified as Left Breast Lumpectomy and Sentinel Node Biopsy. A Time Out was held and the above information confirmed.   After induction of anesthesia, the left arm, breast, and chest were prepped and draped in standard fashion.  The lumpectomy was performed by creating an elliptical incision incorporating the nipple.  Skin hooks were used to elevate the skin and the cautery was used to dissect around the mass.  Dissection was carried down to the pectoral fascia.  The specimen was marked with the margin marker paint kit.  The specimen was placed in the faxatron to confirm presence on the biopsy clip.  Hemostasis was achieved with cautery.  Large clips were placed on the specimen cavity edges for radiation.   The wound was irrigated and closed in layers with a deep 2-0 vicryl,  a 3-0 Vicryl  deep dermal interrupted and a 4-0 Monocryl subcuticular.  Using a hand-held gamma probe, axillary sentinel nodes were identified transcutaneously.  An oblique incision was created below the axillary hairline.  Dissection was carried through the clavipectoral fascia.  Three deep level 2 axillary sentinel nodes were removed. Lymphovascular channels were clipped.  The wound was irrigated.  Hemostasis was achieved with cautery and clips.  The axillary incision was closed with 3-0 vicryl deep dermal interrupted sutures and 4-0 monocryl subcuticular closure in layers.      Sterile dressings were applied. At the end of the operation, all sponge, instrument, and needle counts were correct.  Findings: no gross residual disease, posterior margin is pectoralis, anterior margin is skin, which was taken with the specimen en bloc.  SLN #1 cps 590, SLN #2 6222, SLN #3 cps 92, background cps 8  Estimated Blood Loss:  Minimal         Specimens: left breast tissue with seed, Three left axillary sentinel lymph nodes                Complications:  None; patient tolerated the procedure well.         Disposition: PACU - hemodynamically stable.         Condition: stable

## 2020-12-23 NOTE — Anesthesia Postprocedure Evaluation (Signed)
Anesthesia Post Note  Patient: Molly Tucker  Procedure(s) Performed: LEFT BREAST LUMPECTOMY WITH SENTINEL LYMPH NODE BX (Left Breast)     Patient location during evaluation: PACU Anesthesia Type: Regional and General Level of consciousness: awake Pain management: pain level controlled Vital Signs Assessment: post-procedure vital signs reviewed and stable Respiratory status: spontaneous breathing, nonlabored ventilation, respiratory function stable and patient connected to nasal cannula oxygen Cardiovascular status: blood pressure returned to baseline and stable Postop Assessment: no apparent nausea or vomiting Anesthetic complications: no   No complications documented.  Last Vitals:  Vitals:   12/23/20 1335 12/23/20 1350  BP: 119/88 110/79  Pulse: 81 75  Resp: 12 17  Temp:  (!) 36.3 C  SpO2: 97% 94%    Last Pain:  Vitals:   12/23/20 1350  TempSrc:   PainSc: 4                  Deidrea Gaetz P Royalty Fakhouri

## 2020-12-23 NOTE — Anesthesia Procedure Notes (Signed)
Anesthesia Regional Block: Pectoralis block   Pre-Anesthetic Checklist: ,, timeout performed, Correct Patient, Correct Site, Correct Laterality, Correct Procedure, Correct Position, site marked, Risks and benefits discussed,  Surgical consent,  Pre-op evaluation,  At surgeon's request and post-op pain management  Laterality: Left  Prep: chloraprep       Needles:  Injection technique: Single-shot  Needle Type: Echogenic Stimulator Needle     Needle Length: 10cm  Needle Gauge: 20     Additional Needles:   Procedures:,,,, ultrasound used (permanent image in chart),,,,  Narrative:  Start time: 12/23/2020 10:40 AM End time: 12/23/2020 10:50 AM Injection made incrementally with aspirations every 5 mL.  Performed by: Personally  Anesthesiologist: Murvin Natal, MD  Additional Notes: Functioning IV was confirmed and monitors were applied.  A timeout was performed. Sterile prep, hand hygiene and sterile gloves were used. A 39mm 19ga Arrow echogenic stimulator needle was used. Negative aspiration and negative test dose prior to incremental administration of local anesthetic. The patient tolerated the procedure well.  Ultrasound guidance: relevent anatomy identified, needle position confirmed, local anesthetic spread visualized around nerve(s), vascular puncture avoided.  Image printed for medical record.

## 2020-12-24 ENCOUNTER — Encounter: Payer: Self-pay | Admitting: *Deleted

## 2020-12-24 ENCOUNTER — Encounter (HOSPITAL_COMMUNITY): Payer: Self-pay | Admitting: General Surgery

## 2020-12-27 LAB — SURGICAL PATHOLOGY

## 2020-12-28 ENCOUNTER — Encounter: Payer: Self-pay | Admitting: *Deleted

## 2021-01-04 NOTE — Progress Notes (Signed)
[place]  Telephone:(336) [phone] Fax:(336) [phone]     ID: [name] DOB: 1932-08-04  MR#: [id number]  WGY#:[id number]  Patient Care Team: Binnie Rail, MD as PCP - General (Internal Medicine) Deboraha Sprang, MD as PCP - Cardiology (Cardiology) Charlton Haws, [facility] as Pharmacist (Pharmacist) Melissa Noon, [place] as Referring Physician (Optometry) Katy Apo, MD as Consulting Physician (Ophthalmology) Mauro Kaufmann, RN as Oncology Nurse Navigator Rockwell Germany, RN as Oncology Nurse Navigator Chauncey Cruel, MD OTHER MD:  CHIEF COMPLAINT: estrogen receptor positive breast cancer  CURRENT TREATMENT: Anastrozole   HISTORY OF CURRENT ILLNESS: Molly Tucker herself palpated a retroareolar left breast mass. She underwent bilateral diagnostic mammography with tomography and left breast ultrasonography at [facility] on 11/02/2020 showing: breast density category B; palpable 2.3 cm indeterminate mass in retroareolar left breast at 8 o'clock, involving skin and underlying breast; normal-appearing left axillary lymph nodes.  Accordingly on 11/30/2020 she proceeded to biopsy of the left breast area in question. The pathology from this procedure ([id number]) showed: invasive ductal carcinoma, grade 1-2, with extracellular mucin; ductal carcinoma in situ. Prognostic indicators significant for: estrogen receptor, 100% positive and progesterone receptor, 100% positive, both with strong staining intensity. Proliferation marker Ki67 at 5%. HER2 negative by immunohistochemistry (1+).  She proceeded to left lumpectomy on 12/23/2020 under Dr. Barry Dienes. Pathology from the procedure ([id number]) showing: invasive ductal carcinoma with extracellular mucin, 2.7 cm, grade 1; ductal carcinoma in situ; margins not involved.  All three biopsied lymph nodes were negative for metastatic carcinoma (0/3).  Cancer Staging Malignant neoplasm of lower-inner quadrant of left  breast in female, estrogen receptor positive ([place]) Staging form: Breast, AJCC 8th Edition - Clinical stage from 11/30/2020: Stage IB (cT2, cN0, cM0, G2, ER+, PR+, HER2-) - Signed by Chauncey Cruel, MD on 01/05/2021 Stage prefix: Initial diagnosis  The patient's subsequent history is as detailed below.   INTERVAL HISTORY: Molly Tucker was evaluated in the breast cancer clinic on 01/05/2021   She met with Dr. Lisbeth Renshaw on 12/15/2020 to discuss radiation therapy.  She underwent genetic counseling on 12/16/2020. She ultimately opted not to pursue testing.   REVIEW OF SYSTEMS: The patient denies unusual headaches, visual changes, nausea, vomiting, stiff neck, dizziness, or gait imbalance. There has been no cough, phlegm production, or pleurisy, no chest pain or pressure, and no change in bowel or bladder habits. The patient denies fever, rash, bleeding, unexplained fatigue or unexplained weight loss.  For exercise she walks Molly Tucker dog [name] about 30 minutes most days.  A detailed review of systems was otherwise entirely negative.   COVID 19 VACCINATION STATUS: [place] x2, most recently 12/2019   PAST MEDICAL HISTORY: Past Medical History:  Diagnosis Date  . Arthritis   . Atrial tachycardia ([place])   . Breast cancer ([place]) 11/30/2020  . Carotid bruit 2005   right, carotid doppler: <39% occlusion  . Complication of anesthesia   . Dysrhythmia    Chronic A. Fib  . Family history of breast cancer   . Family history of GI tract cancer   . Family history of stomach cancer   . Hyperlipidemia    LDL goal =<120  . Hyperplastic colonic polyp 2006   Dr. Carlean Purl, due 2016  . Hypertension   . Osteoporosis    S/P  biphosphonates x 5 yrs  . Persistent atrial fibrillation ([place])   . PONV (postoperative nausea and vomiting)     PAST SURGICAL HISTORY: Past Surgical History:  Procedure Laterality Date  . BREAST LUMPECTOMY WITH SENTINEL LYMPH NODE BIOPSY Left 12/23/2020   Procedure: LEFT BREAST LUMPECTOMY WITH  SENTINEL LYMPH NODE BX;  Surgeon: Stark Klein, MD;  Location: [facility];  Service: General;  Laterality: Left;  RNFA, PEC BLOCK; START TIME OF 1030 rm 2 per ivey  . CARDIOVERSION N/A 03/31/2015   Procedure: CARDIOVERSION;  Surgeon: Sanda Klein, MD;  Location: [facility];  Service: Cardiovascular;  Laterality: N/A;  . CARDIOVERSION N/A 12/21/2015   Procedure: CARDIOVERSION;  Surgeon: Lelon Perla, MD;  Location: [facility] ENDOSCOPY;  Service: Cardiovascular;  Laterality: N/A;  . CARDIOVERSION N/A 01/09/2017   Procedure: CARDIOVERSION;  Surgeon: Jerline Pain, MD;  Location: [place] [facility];  Service: Cardiovascular;  Laterality: N/A;  . CARDIOVERSION N/A 07/09/2019   Procedure: CARDIOVERSION;  Surgeon: Sanda Klein, MD;  Location: [facility] ENDOSCOPY;  Service: Cardiovascular;  Laterality: N/A;  . CARDIOVERSION N/A 01/14/2020   Procedure: CARDIOVERSION;  Surgeon: Geralynn Rile, MD;  Location: [facility];  Service: Cardiovascular;  Laterality: N/A;  . CARDIOVERSION N/A 02/23/2020   Procedure: CARDIOVERSION;  Surgeon: Jerline Pain, MD;  Location[facility] ENDOSCOPY;  Service: Cardiovascular;  Laterality: N/A;  . COLONOSCOPY W/ POLYPECTOMY  2006  . DILATION AND CURETTAGE OF UTERUS    . MOUTH SURGERY  2009-2010   implants, Dr. Marcelyn Ditty    FAMILY HISTORY: Family History  Problem Relation Age of Onset  . Coronary artery disease Father        MI in 65s  . Coronary artery disease Sister        stent 2009  . Breast cancer Sister 51       bilateral, diagnosed in other breast at age 19  . Cancer Paternal Aunt        colon, possible breast cancer, dx 50s/60s  . Arthritis Other        aunts  . Stomach cancer Maternal Uncle        dx 73s  . Intellectual disability Paternal Uncle   . Cancer Paternal Aunt        gastrointestinal, dx 50s/60s  . Stroke Neg Hx    Molly Tucker mother died at age 53 with congestive heart failure. Molly Tucker father died at age 45 from heart disease. Copper has 2 brothers and 4 sisters. She  reports bilateral breast cancers in one sister-- the first at age 22, and the other breast at age 36.  She also has 2 paternal aunts that she believes had breast cancer   GYNECOLOGIC HISTORY:  No LMP recorded. Patient is postmenopausal. Menarche: 87 years old Age at first live birth: 87 years old [place] P 2 LMP HRT never used  Hysterectomy? no BSO? no   SOCIAL HISTORY: (updated 12/2020)  Laketta is currently retired from working for [employer].  Molly Tucker husband is 73, has some speech difficulties, and has a colostomy.  He has no use of his right hand which means she ends up being the caregiver for that.  Also at home is the patient's son Rodman Key who drives a truck.  Son Shanon Brow lives in [place] and works in Press photographer.  The patient has 2 grandsons 1 granddaughter and one great granddaughter.  She belongs to the [place]: She has named Molly Tucker son Rodman Key as Molly Tucker healthcare power of attorney   HEALTH MAINTENANCE: Social History   Tobacco Use  . Smoking status: Former Smoker    Quit date: 10/30/1976    Years since quitting: 44.2  .  Smokeless tobacco: Never Used  . Tobacco comment: Patient would ONLY smoke occasionally   Vaping Use  . Vaping Use: Never used  Substance Use Topics  . Alcohol use: No  . Drug use: No     Colonoscopy:   PAP:  Bone density: Remote; took alendronate at some point   No Known Allergies  Current Outpatient Medications  Medication Sig Dispense Refill  . acetaminophen (TYLENOL) 500 MG tablet Take 500 mg by mouth every 6 (six) hours as needed for moderate pain.    Marland Kitchen amiodarone (PACERONE) 200 MG tablet Take 1 tablet (200 mg total) by mouth daily. 90 tablet 3  . ELIQUIS 5 MG TABS tablet TAKE 1 TABLET BY MOUTH  TWICE DAILY (Patient taking differently: Take 5 mg by mouth 2 (two) times daily.) 180 tablet 1  . furosemide (LASIX) 20 MG tablet Take one tablet by mouth ( 20 mg) daily (Patient taking differently: Take 20 mg by mouth  daily.) 90 tablet 3  . metoprolol succinate (TOPROL XL) 25 MG 24 hr tablet Take 1 tablet (25 mg total) by mouth daily. (Patient taking differently: Take 25 mg by mouth daily at 6 PM.) 90 tablet 3  . traMADol (ULTRAM) 50 MG tablet Take 1 tablet (50 mg total) by mouth every 6 (six) hours as needed for moderate pain or severe pain. 20 tablet 0   No current facility-administered medications for this visit.    OBJECTIVE: White woman who appears younger than stated age  87:   01/05/21 1555  BP: (!) 185/120  Pulse: 93  Resp: 14  Temp: (!) 97.5 F (36.4 C)  SpO2: 93%     Body mass index is 24.68 kg/m.   Wt Readings from Last 3 Encounters:  01/05/21 143 lb 12.8 oz (65.2 kg)  12/23/20 142 lb 3.2 oz (64.5 kg)  12/17/20 142 lb 3.2 oz (64.5 kg)      ECOG FS:1 - Symptomatic but completely ambulatory  Ocular: Sclerae unicteric, pupils round and equal Ear-nose-throat: Wearing a mask Lymphatic: No cervical or supraclavicular adenopathy Lungs no rales or rhonchi Heart regular rate and rhythm Abd soft, nontender, positive bowel sounds MSK no focal spinal tenderness, no joint edema Neuro: non-focal, well-oriented, appropriate affect Breasts: The right breast is benign.  The left breast is status post central lumpectomy.  The Steri-Strips are still in place.  There is minimal erythema, in the left axilla there is what appears to be a seroma, not tender, not erythematous.   LAB RESULTS:  CMP     Component Value Date/Time   NA 141 01/05/2021 1534   NA 141 12/14/2020 1239   K 4.5 01/05/2021 1534   CL 106 01/05/2021 1534   CO2 26 01/05/2021 1534   GLUCOSE 85 01/05/2021 1534   BUN 15 01/05/2021 1534   BUN 14 12/14/2020 1239   CREATININE 0.90 01/05/2021 1534   CREATININE 0.87 12/14/2015 1415   CALCIUM 8.9 01/05/2021 1534   PROT 6.7 01/05/2021 1534   PROT 6.1 11/08/2020 1601   ALBUMIN 3.7 01/05/2021 1534   ALBUMIN 3.9 11/08/2020 1601   AST 23 01/05/2021 1534   ALT 20 01/05/2021  1534   ALKPHOS 64 01/05/2021 1534   BILITOT 0.9 01/05/2021 1534   GFRNONAA >60 01/05/2021 1534   GFRAA 68 12/14/2020 1239    No results found for: TOTALPROTELP, ALBUMINELP, A1GS, A2GS, BETS, BETA2SER, GAMS, MSPIKE, SPEI  Lab Results  Component Value Date   WBC 5.7 01/05/2021   NEUTROABS 3.9 01/05/2021  HGB 13.9 01/05/2021   HCT 43.7 01/05/2021   MCV 94.0 01/05/2021   PLT 199 01/05/2021    No results found for: LABCA2  No components found for: CBJSEG315  No results for input(s): INR in the last 168 hours.  No results found for: LABCA2  No results found for: VVO160  No results found for: VPX106  No results found for: YIR485  No results found for: CA2729  No components found for: HGQUANT  No results found for: CEA1 / No results found for: CEA1   No results found for: AFPTUMOR  No results found for: CHROMOGRNA  No results found for: KPAFRELGTCHN, LAMBDASER, KAPLAMBRATIO (kappa/lambda light chains)  No results found for: HGBA, HGBA2QUANT, HGBFQUANT, HGBSQUAN (Hemoglobinopathy evaluation)   No results found for: LDH  No results found for: IRON, TIBC, IRONPCTSAT (Iron and TIBC)  No results found for: FERRITIN  Urinalysis    Component Value Date/Time   COLORURINE YELLOW 10/31/2018 1041   APPEARANCEUR Sl Cloud[place] (A) 10/31/2018 1041   LABSPEC 1.020 10/31/2018 1041   PHURINE 5.5 10/31/2018 1041   GLUCOSEU NEGATIVE 10/31/2018 1041   HGBUR MODERATE (A) 10/31/2018 1041   BILIRUBINUR NEGATIVE 10/31/2018 1041   KETONESUR NEGATIVE 10/31/2018 1041   UROBILINOGEN 0.2 10/31/2018 1041   NITRITE NEGATIVE 10/31/2018 1041   LEUKOCYTESUR MODERATE (A) 10/31/2018 1041     STUDIES: NM Sentinel Node Inj-No Rpt (Breast)  Result Date: 12/23/2020 Sulfur colloid was injected by the nuclear medicine technologist for melanoma sentinel node.     ELIGIBLE FOR AVAILABLE RESEARCH PROTOCOL: no  ASSESSMENT: 87 y.o. Molly Tucker woman status post left breast lower inner quadrant  biopsy 11/30/2020 for a clinical T2 N0, stage IB invasive ductal carcinoma, grade 1 or 2, estrogen and progesterone receptor positive, Molly Tucker-2 not amplified, with an MIB-1 of 5%.  (1) status post left breast lower inner quadrant biopsy 12/23/2020 for a pT2 pN0, stage IB invasive ductal carcinoma, grade 1, with negative margins  (a) a total of 3 left axillary lymph nodes removed, all clear  (2) patient declines genetics testing  (3) patient declines adjuvant radiation  (4) anastrozole started 01/10/2021    PLAN: I met today with Noelia to review Molly Tucker new diagnosis. Specifically we discussed the biology of Molly Tucker breast cancer, its diagnosis, staging, treatment  options and prognosis. We discussed the difference between local and systemic therapy. In terms of loco-regional treatment, lumpectomy plus radiation is equivalent to mastectomy as far as survival is concerned. For this reason, and because the cosmetic results are generally superior, we recommended breast conserving surgery.  She did well with that and we reviewed the specific pathologic results which I also gave Molly Tucker in writing.  We then discussed the rationale for systemic therapy. There is some risk that this cancer may have already spread to other parts of Molly Tucker body. Patients frequently ask at this point about bone scans, CAT scans and PET scans to find out if they have occult breast cancer somewhere else. The problem is that in early stage disease we are much more likely to find false positives then true cancers and this would expose the patient to unnecessary procedures as well as unnecessary radiation. Scans cannot answer the question the patient really would like to know, which is whether she has microscopic disease elsewhere in Molly Tucker body. For those reasons we do not recommend them.  Of course we would proceed to aggressive evaluation of any symptoms that might suggest metastatic disease, but that is not the case here.  Next  we went over  the options for systemic therapy which are anti-estrogens, anti-Molly Tucker-2 immunotherapy, and chemotherapy. Clarabelle does not meet criteria for anti-Molly Tucker-2 immunotherapy. She is a good candidate for anti-estrogens.  The question of chemotherapy is more complicated. Chemotherapy is most effective in rapidly growing, aggressive tumors. It is much less effective in low-grade, slow growing cancers, like Izabellah 's. For that reason and also given the patient's age we are going to forego chemotherapy.  We are not requesting an Oncotype  The patient has discussed radiation with Dr. Lisbeth Renshaw.  She very much wants to not receive radiation.  She says Molly Tucker very good friend "did not have radiation " and is fine.  We reviewed the studies randomizing women over 70 with small neck node-negative estrogen receptor positive tumors between radiation and no radiation in the studies found no difference in survival.  There was a small increase in local recurrence in the 5 to 8% range in the group that did not receive radiation.  This is of course assuming the patient takes antiestrogens for 5 years.  After that discussion Jenniah made a firm decision that she did not want radiation.  We then moved onto antiestrogens and she understands the possible toxicities side effects and complications of anastrozole.  Were going to begin that this week.  I am going to check with Molly Tucker in about 5 weeks to make sure she is tolerating it well and if so I will set Molly Tucker up for mammography sometime in July and will obtain a bone density at the same time.  If she does not tolerate anastrozole well we can consider exemestane.  I would tend to avoid tamoxifen in this patient with a history of atrial fibrillation  Riely has a good understanding of the overall plan. She agrees with it. She knows the goal of treatment in Molly Tucker case is cure. She will call with any problems that may develop before Molly Tucker next visit here.  Total encounter time 60 minutes.Sarajane Jews  C. Rasmus Preusser, MD 01/05/2021 4:57 PM Medical Oncology and Hematology [facility] [place],  [address] Tel. [phone]    Fax. [phone]   This document serves as a record of services personally performed by Lurline Del, MD. It was created on his behalf by Wilburn Mylar, a trained medical scribe. The creation of this record is based on the scribe's personal observations and the provider's statements to them.   I, Lurline Del MD, have reviewed the above documentation for accuracy and completeness, and I agree with the above.    *Total Encounter Time as defined by the Centers for Medicare and Medicaid Services includes, in addition to the face-to-face time of a patient visit (documented in the note above) non-face-to-face time: obtaining and reviewing outside history, ordering and reviewing medications, tests or procedures, care coordination (communications with other health care professionals or caregivers) and documentation in the medical record.

## 2021-01-05 ENCOUNTER — Inpatient Hospital Stay: Payer: Medicare Other

## 2021-01-05 ENCOUNTER — Inpatient Hospital Stay: Payer: Medicare Other | Attending: Oncology | Admitting: Oncology

## 2021-01-05 ENCOUNTER — Encounter: Payer: Self-pay | Admitting: *Deleted

## 2021-01-05 ENCOUNTER — Other Ambulatory Visit: Payer: Self-pay

## 2021-01-05 VITALS — BP 185/120 | HR 93 | Temp 97.5°F | Resp 14 | Ht 64.0 in | Wt 143.8 lb

## 2021-01-05 DIAGNOSIS — C50312 Malignant neoplasm of lower-inner quadrant of left female breast: Secondary | ICD-10-CM | POA: Diagnosis not present

## 2021-01-05 DIAGNOSIS — Z79811 Long term (current) use of aromatase inhibitors: Secondary | ICD-10-CM | POA: Diagnosis not present

## 2021-01-05 DIAGNOSIS — Z170 Estrogen receptor positive status [ER+]: Secondary | ICD-10-CM | POA: Insufficient documentation

## 2021-01-05 DIAGNOSIS — Z87891 Personal history of nicotine dependence: Secondary | ICD-10-CM

## 2021-01-05 LAB — CBC WITH DIFFERENTIAL (CANCER CENTER ONLY)
Abs Immature Granulocytes: 0.01 10*3/uL (ref 0.00–0.07)
Basophils Absolute: 0.1 10*3/uL (ref 0.0–0.1)
Basophils Relative: 1 %
Eosinophils Absolute: 0.1 10*3/uL (ref 0.0–0.5)
Eosinophils Relative: 1 %
HCT: 43.7 % (ref 36.0–46.0)
Hemoglobin: 13.9 g/dL (ref 12.0–15.0)
Immature Granulocytes: 0 %
Lymphocytes Relative: 21 %
Lymphs Abs: 1.2 10*3/uL (ref 0.7–4.0)
MCH: 29.9 pg (ref 26.0–34.0)
MCHC: 31.8 g/dL (ref 30.0–36.0)
MCV: 94 fL (ref 80.0–100.0)
Monocytes Absolute: 0.5 10*3/uL (ref 0.1–1.0)
Monocytes Relative: 8 %
Neutro Abs: 3.9 10*3/uL (ref 1.7–7.7)
Neutrophils Relative %: 69 %
Platelet Count: 199 10*3/uL (ref 150–400)
RBC: 4.65 MIL/uL (ref 3.87–5.11)
RDW: 14.7 % (ref 11.5–15.5)
WBC Count: 5.7 10*3/uL (ref 4.0–10.5)
nRBC: 0 % (ref 0.0–0.2)

## 2021-01-05 LAB — CMP (CANCER CENTER ONLY)
ALT: 20 U/L (ref 0–44)
AST: 23 U/L (ref 15–41)
Albumin: 3.7 g/dL (ref 3.5–5.0)
Alkaline Phosphatase: 64 U/L (ref 38–126)
Anion gap: 9 (ref 5–15)
BUN: 15 mg/dL (ref 8–23)
CO2: 26 mmol/L (ref 22–32)
Calcium: 8.9 mg/dL (ref 8.9–10.3)
Chloride: 106 mmol/L (ref 98–111)
Creatinine: 0.9 mg/dL (ref 0.44–1.00)
GFR, Estimated: >60 mL/min (ref >60)
Glucose, Bld: 85 mg/dL (ref 70–99)
Potassium: 4.5 mmol/L (ref 3.5–5.1)
Sodium: 141 mmol/L (ref 135–145)
Total Bilirubin: 0.9 mg/dL (ref 0.3–1.2)
Total Protein: 6.7 g/dL (ref 6.5–8.1)

## 2021-01-05 MED ORDER — ANASTROZOLE 1 MG PO TABS: 1.0000 mg | ORAL_TABLET | Freq: Every day | ORAL | 4 refills | Status: DC

## 2021-01-06 ENCOUNTER — Telehealth: Payer: Self-pay | Admitting: Oncology

## 2021-01-06 NOTE — Telephone Encounter (Signed)
Scheduled appts per 3/9 los. Left voicemail with appt date and time.

## 2021-01-10 ENCOUNTER — Telehealth: Payer: Self-pay

## 2021-01-10 NOTE — Telephone Encounter (Signed)
Contacted pt regarding her call about having a noseblled and if it could be related to taking anastrozole. Told pt per MD no that anastrozole should not cause nose bleeds. Encouraged pt to use saline nose spray or some type of nasal moisturizer. Pt verbalized understanding.

## 2021-01-17 NOTE — Progress Notes (Unsigned)
Cardiology Office Note Date:  01/17/2021  Patient ID:  Molly Tucker, Molly Tucker 1932-03-31, MRN [id number] PCP:  Binnie Rail, MD  Electrophysiologist:  [name]   Chief Complaint: 51mo f/u  History of Present Illness: [name] is a 87 y.o. female with history of PAFib, HTN, HLD, and osteoporosis, chronic CHF (diastolic).  She comes in today to be seen for [name], last seen by him it looks, 2017, following regularly with [name], NP. Most recently 12/14/20, noted Dec 2021 pt called with recurrent Afib, her amio increased, though wanted to wait until after the holidays to come in.  Amio making her feel a bit nauseated.  Wanted to try DCCV, scheduled for a lumpectomy the following week. Planned to follow up in a month to visit DCCV vs rate control strategy, noting not as symptomatic with her Afib as she had been historically.   TODAY She is recovering from her lumpectomy, still a bit sore, though doing well. She can tell she is is still in Afib, "something is just different" No CP, no SOB. She thinks she may tend to get a little more swollen intermittently Though does not take the lasix every day. She notes the swelling towrads mid-late day, gone in the morning. No dizzy spells, near syncope or syncope. She is able to do the things she needs and wants to do.  From the Afib perspective she thinks what bothers her the most is the stomach upset that she is fairly certain is the amiodarone  She had a nose bleed not too long ago that took a while to stop, none again, no bleeding or signs of bleeding otherwise.   PAF Hx: Diagnosed April 2016 DCCV: 2016 2017 2018 2020 Failed April 2021  AAD Amiodarone started March 2021   Past Medical History:  Diagnosis Date  . Arthritis   . Atrial tachycardia ([place])   . Breast cancer ([place]) 11/30/2020  . Carotid bruit 2005   right, carotid doppler: <39% occlusion  . Complication of anesthesia   . Dysrhythmia    Chronic A. Fib   . Family history of breast cancer   . Family history of GI tract cancer   . Family history of stomach cancer   . Hyperlipidemia    LDL goal =<120  . Hyperplastic colonic polyp 2006   Dr. Carlean Purl, due 2016  . Hypertension   . Osteoporosis    S/P  biphosphonates x 5 yrs  . Persistent atrial fibrillation ([place])   . PONV (postoperative nausea and vomiting)     Past Surgical History:  Procedure Laterality Date  . BREAST LUMPECTOMY WITH SENTINEL LYMPH NODE BIOPSY Left 12/23/2020   Procedure: LEFT BREAST LUMPECTOMY WITH SENTINEL LYMPH NODE BX;  Surgeon: Stark Klein, MD;  Location: [facility];  Service: General;  Laterality: Left;  RNFA, PEC BLOCK; START TIME OF 1030 rm 2 per ivey  . CARDIOVERSION N/A 03/31/2015   Procedure: CARDIOVERSION;  Surgeon: Sanda Klein, MD;  Location: [facility] ENDOSCOPY;  Service: Cardiovascular;  Laterality: N/A;  . CARDIOVERSION N/A 12/21/2015   Procedure: CARDIOVERSION;  Surgeon: Lelon Perla, MD;  Location: [facility] ENDOSCOPY;  Service: Cardiovascular;  Laterality: N/A;  . CARDIOVERSION N/A 01/09/2017   Procedure: CARDIOVERSION;  Surgeon: Jerline Pain, MD;  Location: [facility];  Service: Cardiovascular;  Laterality: N/A;  . CARDIOVERSION N/A 07/09/2019   Procedure: CARDIOVERSION;  Surgeon: Sanda Klein, MD;  Location: [facility] ENDOSCOPY;  Service: Cardiovascular;  Laterality: N/A;  . CARDIOVERSION N/A 01/14/2020  Procedure: CARDIOVERSION;  Surgeon: Geralynn Rile, MD;  Location: [facility];  Service: Cardiovascular;  Laterality: N/A;  . CARDIOVERSION N/A 02/23/2020   Procedure: CARDIOVERSION;  Surgeon: Jerline Pain, MD;  Location: [facility] ENDOSCOPY;  Service: Cardiovascular;  Laterality: N/A;  . COLONOSCOPY W/ POLYPECTOMY  2006  . DILATION AND CURETTAGE OF UTERUS    . MOUTH SURGERY  2009-2010   implants, Dr. Marcelyn Ditty    Current Outpatient Medications  Medication Sig Dispense Refill  . acetaminophen (TYLENOL) 500 MG tablet Take 500 mg by mouth every 6 (six) hours as  needed for moderate pain.    Marland Kitchen amiodarone (PACERONE) 200 MG tablet Take 1 tablet (200 mg total) by mouth daily. 90 tablet 3  . anastrozole (ARIMIDEX) 1 MG tablet Take 1 tablet (1 mg total) by mouth daily. 90 tablet 4  . ELIQUIS 5 MG TABS tablet TAKE 1 TABLET BY MOUTH  TWICE DAILY (Patient taking differently: Take 5 mg by mouth 2 (two) times daily.) 180 tablet 1  . furosemide (LASIX) 20 MG tablet Take one tablet by mouth ( 20 mg) daily (Patient taking differently: Take 20 mg by mouth daily.) 90 tablet 3  . metoprolol succinate (TOPROL XL) 25 MG 24 hr tablet Take 1 tablet (25 mg total) by mouth daily. (Patient taking differently: Take 25 mg by mouth daily at 6 PM.) 90 tablet 3  . traMADol (ULTRAM) 50 MG tablet Take 1 tablet (50 mg total) by mouth every 6 (six) hours as needed for moderate pain or severe pain. 20 tablet 0   No current facility-administered medications for this visit.    Allergies:   Patient has no known allergies.   Social History:  The patient  reports that she quit smoking about 44 years ago. She has never used smokeless tobacco. She reports that she does not drink alcohol and does not use drugs.   Family History:  The patient's family history includes Arthritis in an other family member; Breast cancer (age of onset: 18) in her sister; Cancer in her paternal aunt and paternal aunt; Coronary artery disease in her father and sister; Intellectual disability in her paternal uncle; Stomach cancer in her maternal uncle.  ROS:  Please see the history of present illness.    All other systems are reviewed and otherwise negative.   PHYSICAL EXAM:  VS:  There were no vitals taken for this visit. BMI: There is no height or weight on file to calculate BMI. Well nourished, well developed, in no acute distress  HEENT: normocephalic, atraumatic  Neck: no JVD, carotid bruits or masses Cardiac:  irreg-irreg, soft SM, no rubs, or gallops Lungs:  CTA b/l, no wheezing, rhonchi or rales  Abd:  soft, nontender MS: no deformity age appropriate atrophy Ext: trace post ankle edema, numerous spider veins with some large veins as well  Skin: warm and dry, no rash Neuro:  No gross deficits appreciated Psych: euthymic mood, full affect   EKG:  Done today and reviewed by myself AFib 91bpm, unchanged   03/08/2020; TTE IMPRESSIONS  1. Left ventricular ejection fraction, by estimation, is 60 to 65%. The  left ventricle has normal function. The left ventricle has no regional  wall motion abnormalities. Left ventricular diastolic parameters are  consistent with Grade II diastolic  dysfunction (pseudonormalization). Elevated left ventricular end-diastolic  pressure.  2. Right ventricular systolic function is normal. The right ventricular  size is moderately enlarged. There is moderately elevated pulmonary artery  systolic pressure. The estimated right  ventricular systolic pressure is  08.6 mmHg.  3. Left atrial size was moderately dilated.  4. Right atrial size was severely dilated.  5. The mitral valve is normal in structure. Mild mitral valve  regurgitation. No evidence of mitral stenosis.  6. Tricuspid valve regurgitation is moderate.  7. The aortic valve is tricuspid. Aortic valve regurgitation is not  visualized. Mild aortic valve sclerosis is present, with no evidence of  aortic valve stenosis.  8. The inferior vena cava is normal in size with <50% respiratory  variability, suggesting right atrial pressure of 8 mmHg.   03/08/15: Echocardiogram Study Conclusions - Left ventricle: The cavity size was normal. Wall thickness was normal. Systolic function was normal. The estimated ejection fraction was in the range of 55% to 60%. Wall motion was normal; there were no regional wall motion abnormalities. - Right ventricle: The cavity size was mildly dilated. - Right atrium: The atrium was mildly dilated. - Atrial septum: No defect or patent foramen ovale was  identified. - Tricuspid valve: There was moderate regurgitation. LA 75mm  Recent Labs: 11/08/2020: TSH 3.500 01/05/2021: ALT 20; BUN 15; Creatinine 0.90; Hemoglobin 13.9; Platelet Count 199; Potassium 4.5; Sodium 141  04/19/2020: Chol/HDL Ratio 3.3; Cholesterol, Total 195; HDL 59; LDL Chol Calc (NIH) 118; Triglycerides 102   Estimated Creatinine Clearance: 38 mL/min (by C-G formula based on SCr of 0.9 mg/dL).   Wt Readings from Last 3 Encounters:  01/05/21 143 lb 12.8 oz (65.2 kg)  12/23/20 142 lb 3.2 oz (64.5 kg)  12/17/20 142 lb 3.2 oz (64.5 kg)     Other studies reviewed: Additional studies/records reviewed today include: summarized above  ASSESSMENT AND PLAN:  1.   Persistent AFib       CHADS2Vasc is 5 on Eliquis, appropriately dosed        She reports being in Afib since November  We spoke at length rate vs rhythm control strategy. The role of amiodarone  She at this point states she has had enough failed DCCV that she really just is not inclined to go ahead with DCCV again She asks about reducing the amiodarone back to 100mg  daily that she tolerated without GI upset I recommended that is we were no longer going to pursue rhythm control, I would stop the amiodarone. I suspect a component at least of her ankle swelling is venous insufficiency  At this time we will plan for rate control, stop her amiodarone and I will see her back in about 6 weeks I suggested she wear support stockings           2. HTN     She monitors at home is usually much better     Disposition: F/u in 78months, here or with the AF clinic sooner if needed.   Current medicines are reviewed at length with the patient today.  The patient did not have any concerns regarding medicines.  Haywood Lasso, PA-C 01/17/2021 12:42 PM     [address] [place] [address] [phone] (office)  [phone] (fax)

## 2021-01-20 ENCOUNTER — Other Ambulatory Visit: Payer: Self-pay

## 2021-01-20 ENCOUNTER — Ambulatory Visit: Payer: Medicare Other | Admitting: Physician Assistant

## 2021-01-20 ENCOUNTER — Encounter: Payer: Self-pay | Admitting: Physician Assistant

## 2021-01-20 VITALS — BP 150/90 | HR 91 | Ht 64.0 in | Wt 143.8 lb

## 2021-01-20 DIAGNOSIS — Z79899 Other long term (current) drug therapy: Secondary | ICD-10-CM

## 2021-01-20 DIAGNOSIS — I4819 Other persistent atrial fibrillation: Secondary | ICD-10-CM

## 2021-01-20 DIAGNOSIS — I10 Essential (primary) hypertension: Secondary | ICD-10-CM

## 2021-01-20 NOTE — Patient Instructions (Signed)
Medication Instructions:   STOP TAKING AMIODARONE    *If you need a refill on your cardiac medications before your next appointment, please call your pharmacy*   Lab Work: NONE ORDERED  TODAY   If you have labs (blood work) drawn today and your tests are completely normal, you will receive your results only by: Marland Kitchen MyChart Message (if you have MyChart) OR . A paper copy in the mail If you have any lab test that is abnormal or we need to change your treatment, we will call you to review the results.   Testing/Procedures: NONE ORDERED  TODAY   Follow-Up: At [facility], you and your health needs are our priority.  As part of our continuing mission to provide you with exceptional heart care, we have created designated Provider Care Teams.  These Care Teams include your primary Cardiologist (physician) and Advanced Practice Providers (APPs -  Physician Assistants and Nurse Practitioners) who all work together to provide you with the care you need, when you need it.  We recommend signing up for the patient portal called "MyChart".  Sign up information is provided on this After Visit Summary.  MyChart is used to connect with patients for Virtual Visits (Telemedicine).  Patients are able to view lab/test results, encounter notes, upcoming appointments, etc.  Non-urgent messages can be sent to your provider as well.   To learn more about what you can do with MyChart, go to [email or web address].    Your next appointment:   6 week(s)  The format for your next appointment:   In Person  Provider:   Tommye Standard, PA-C     Other Instructions

## 2021-02-21 NOTE — Progress Notes (Signed)
Molly Tucker  [phone] [phone]     ID: Molly Tucker DOB: 1932/10/25  MR#: [id number]  QIW#:[id number]  Patient Care Team: Binnie Rail, MD as PCP - General (Internal Medicine) Deboraha Sprang, MD as PCP - Cardiology (Cardiology) Charlton Haws, [facility] as Pharmacist (Pharmacist) Melissa Noon, [place] as Referring Physician (Optometry) Katy Apo, MD as Consulting Physician (Ophthalmology) Mauro Kaufmann, RN as Oncology Nurse Navigator Rockwell Germany, RN as Oncology Nurse Navigator Chauncey Cruel, MD OTHER MD:  I connected with Molly Tucker on 02/23/21 at  3:30 PM EDT by video enabled telemedicine visit and verified that I am speaking with the correct person using two identifiers.   I discussed the limitations, risks, security and privacy concerns of performing an evaluation and management service by telemedicine and the availability of in-person appointments. I also discussed with the patient that there may be a patient responsible charge related to this service. The patient expressed understanding and agreed to proceed.   Other persons participating in the visit and their role in the encounter: None  Patient's location: Home Provider's location: [place]  Total time spent: 15 min   CHIEF COMPLAINT: estrogen receptor positive breast cancer  CURRENT TREATMENT: Anastrozole   INTERVAL HISTORY: Molly Tucker was contacted today for follow up of her estrogen receptor positive breast cancer. She was evaluated in the breast cancer clinic on 01/05/2021   She opted not to pursue radiation therapy. She was started on anastrozole at her last visit on 01/05/2021.  She is tolerating anastrozole generally well.  She takes it in the morning.  She has not noted increased hot flashes, problems with vaginal dryness, or arthralgias/myalgias   REVIEW OF SYSTEMS: Molly Tucker tells me she is doing "very well".  She had some stomach upset  recently with a little nausea and diarrhea but that has resolved without any intervention.  She is walking her dog for exercise.  She says that atrial fibrillation slows her down but that is not a new issue.  Detailed review of systems was otherwise stable.   COVID 19 VACCINATION STATUS: [place] x2, most recently 12/2019   HISTORY OF CURRENT ILLNESS: From the original intake note:  Molly Tucker herself palpated a retroareolar left breast mass. She underwent bilateral diagnostic mammography with tomography and left breast ultrasonography at [facility] on 11/02/2020 showing: breast density category B; palpable 2.3 cm indeterminate mass in retroareolar left breast at 8 o'clock, involving skin and underlying breast; normal-appearing left axillary lymph nodes.  Accordingly on 11/30/2020 she proceeded to biopsy of the left breast area in question. The pathology from this procedure ([id number]) showed: invasive ductal carcinoma, grade 1-2, with extracellular mucin; ductal carcinoma in situ. Prognostic indicators significant for: estrogen receptor, 100% positive and progesterone receptor, 100% positive, both with strong staining intensity. Proliferation marker Ki67 at 5%. HER2 negative by immunohistochemistry (1+).  She proceeded to left lumpectomy on 12/23/2020 under Dr. Barry Dienes. Pathology from the procedure ([id number]) showing: invasive ductal carcinoma with extracellular mucin, 2.7 cm, grade 1; ductal carcinoma in situ; margins not involved.  All three biopsied lymph nodes were negative for metastatic carcinoma (0/3).  Cancer Staging Malignant neoplasm of lower-inner quadrant of left breast in female, estrogen receptor positive ([place]) Staging form: Breast, AJCC 8th Edition - Clinical stage from 11/30/2020: Stage IB (cT2, cN0, cM0, G2, ER+, PR+, HER2-) - Signed by Chauncey Cruel, MD on 01/05/2021 Stage prefix: Initial diagnosis  The patient's subsequent history  is as detailed below.   PAST  MEDICAL HISTORY: Past Medical History:  Diagnosis Date  . Arthritis   . Atrial tachycardia ([place])   . Breast cancer ([place]) 11/30/2020  . Carotid bruit 2005   right, carotid doppler: <39% occlusion  . Complication of anesthesia   . Dysrhythmia    Chronic A. Fib  . Family history of breast cancer   . Family history of GI tract cancer   . Family history of stomach cancer   . Hyperlipidemia    LDL goal =<120  . Hyperplastic colonic polyp 2006   Dr. Carlean Purl, due 2016  . Hypertension   . Osteoporosis    S/P  biphosphonates x 5 yrs  . Persistent atrial fibrillation ([place])   . PONV (postoperative nausea and vomiting)     PAST SURGICAL HISTORY: Past Surgical History:  Procedure Laterality Date  . BREAST LUMPECTOMY WITH SENTINEL LYMPH NODE BIOPSY Left 12/23/2020   Procedure: LEFT BREAST LUMPECTOMY WITH SENTINEL LYMPH NODE BX;  Surgeon: Stark Klein, MD;  Location: [place];  Service: General;  Laterality: Left;  RNFA, PEC BLOCK; START TIME OF 1030 rm 2 per ivey  . CARDIOVERSION N/A 03/31/2015   Procedure: CARDIOVERSION;  Surgeon: Sanda Klein, MD;  Location: [place];  Service: Cardiovascular;  Laterality: N/A;  . CARDIOVERSION N/A 12/21/2015   Procedure: CARDIOVERSION;  Surgeon: Lelon Perla, MD;  Location: [facility] ENDOSCOPY;  Service: Cardiovascular;  Laterality: N/A;  . CARDIOVERSION N/A 01/09/2017   Procedure: CARDIOVERSION;  Surgeon: Jerline Pain, MD;  Location: [place];  Service: Cardiovascular;  Laterality: N/A;  . CARDIOVERSION N/A 07/09/2019   Procedure: CARDIOVERSION;  Surgeon: Sanda Klein, MD;  Location: [place] ENDOSCOPY;  Service: Cardiovascular;  Laterality: N/A;  . CARDIOVERSION N/A 01/14/2020   Procedure: CARDIOVERSION;  Surgeon: Geralynn Rile, MD;  Location: [place];  Service: Cardiovascular;  Laterality: N/A;  . CARDIOVERSION N/A 02/23/2020   Procedure: CARDIOVERSION;  Surgeon: Jerline Pain, MD;  Location[facility] ENDOSCOPY;  Service: Cardiovascular;   Laterality: N/A;  . COLONOSCOPY W/ POLYPECTOMY  2006  . DILATION AND CURETTAGE OF UTERUS    . MOUTH SURGERY  2009-2010   implants, Dr. Marcelyn Ditty    FAMILY HISTORY: Family History  Problem Relation Age of Onset  . Coronary artery disease Father        MI in 76s  . Coronary artery disease Sister        stent 2009  . Breast cancer Sister 50       bilateral, diagnosed in other breast at age 40  . Cancer Paternal Aunt        colon, possible breast cancer, dx 50s/60s  . Arthritis Other        aunts  . Stomach cancer Maternal Uncle        dx 45s  . Intellectual disability Paternal Uncle   . Cancer Paternal Aunt        gastrointestinal, dx 50s/60s  . Stroke Neg Hx    Her mother died at age 57 with congestive heart failure. Her father died at age 32 from heart disease. Darnisha has 2 brothers and 4 sisters. She reports bilateral breast cancers in one sister-- the first at age 73, and the other breast at age 19.  She also has 2 paternal aunts that she believes had breast cancer   GYNECOLOGIC HISTORY:  No LMP recorded. Patient is postmenopausal. Menarche: 87 years old Age at first live birth: 87 years old [place] P 2 LMP  HRT never used  Hysterectomy? no BSO? no   SOCIAL HISTORY: (updated 12/2020)  Molly Tucker is currently retired from working for [employer].  Her husband is 5, has some speech difficulties, and has a colostomy.  He has no use of his right hand which means she ends up being the caregiver for that.  Also at home is the patient's son Molly Tucker who drives a truck.  Son Molly Tucker lives in [place] and works in Press photographer.  The patient has 2 grandsons 1 granddaughter and one great granddaughter.  She belongs to the [place]: She has named her son Molly Tucker as her healthcare power of attorney   HEALTH MAINTENANCE: Social History   Tobacco Use  . Smoking status: Former Smoker    Quit date: 10/30/1976    Years since quitting: 44.3  . Smokeless  tobacco: Never Used  . Tobacco comment: Patient would ONLY smoke occasionally   Vaping Use  . Vaping Use: Never used  Substance Use Topics  . Alcohol use: No  . Drug use: No     Colonoscopy:   PAP:  Bone density: Remote; took alendronate at some point   No Known Allergies  Current Outpatient Medications  Medication Sig Dispense Refill  . acetaminophen (TYLENOL) 500 MG tablet Take 500 mg by mouth every 6 (six) hours as needed for moderate pain.    Marland Kitchen anastrozole (ARIMIDEX) 1 MG tablet Take 1 tablet (1 mg total) by mouth daily. 90 tablet 4  . ELIQUIS 5 MG TABS tablet TAKE 1 TABLET BY MOUTH  TWICE DAILY 180 tablet 1  . furosemide (LASIX) 20 MG tablet Take one tablet by mouth ( 20 mg) daily 90 tablet 3  . metoprolol succinate (TOPROL XL) 25 MG 24 hr tablet Take 1 tablet (25 mg total) by mouth daily. 90 tablet 3   No current facility-administered medications for this visit.    OBJECTIVE:  There were no vitals filed for this visit.   There is no height or weight on file to calculate BMI.   Wt Readings from Last 3 Encounters:  01/20/21 143 lb 12.8 oz (65.2 kg)  01/05/21 143 lb 12.8 oz (65.2 kg)  12/23/20 142 lb 3.2 oz (64.5 kg)      ECOG FS:1 - Symptomatic but completely ambulatory  Telemedicine visit 02/22/2021   LAB RESULTS:  CMP     Component Value Date/Time   NA 141 01/05/2021 1534   NA 141 12/14/2020 1239   K 4.5 01/05/2021 1534   CL 106 01/05/2021 1534   CO2 26 01/05/2021 1534   GLUCOSE 85 01/05/2021 1534   BUN 15 01/05/2021 1534   BUN 14 12/14/2020 1239   CREATININE 0.90 01/05/2021 1534   CREATININE 0.87 12/14/2015 1415   CALCIUM 8.9 01/05/2021 1534   PROT 6.7 01/05/2021 1534   PROT 6.1 11/08/2020 1601   ALBUMIN 3.7 01/05/2021 1534   ALBUMIN 3.9 11/08/2020 1601   AST 23 01/05/2021 1534   ALT 20 01/05/2021 1534   ALKPHOS 64 01/05/2021 1534   BILITOT 0.9 01/05/2021 1534   GFRNONAA >60 01/05/2021 1534   GFRAA 68 12/14/2020 1239    No results found for:  TOTALPROTELP, ALBUMINELP, A1GS, A2GS, BETS, BETA2SER, GAMS, MSPIKE, SPEI  Lab Results  Component Value Date   WBC 5.7 01/05/2021   NEUTROABS 3.9 01/05/2021   HGB 13.9 01/05/2021   HCT 43.7 01/05/2021   MCV 94.0 01/05/2021   PLT 199 01/05/2021    No results found  for: LABCA2  No components found for: LKGMWN027  No results for input(s): INR in the last 168 hours.  No results found for: LABCA2  No results found for: OZD664  No results found for: QIH474  No results found for: QVZ563  No results found for: CA2729  No components found for: HGQUANT  No results found for: CEA1 / No results found for: CEA1   No results found for: AFPTUMOR  No results found for: CHROMOGRNA  No results found for: KPAFRELGTCHN, LAMBDASER, KAPLAMBRATIO (kappa/lambda light chains)  No results found for: HGBA, HGBA2QUANT, HGBFQUANT, HGBSQUAN (Hemoglobinopathy evaluation)   No results found for: LDH  No results found for: IRON, TIBC, IRONPCTSAT (Iron and TIBC)  No results found for: FERRITIN  Urinalysis    Component Value Date/Time   COLORURINE YELLOW 10/31/2018 1041   APPEARANCEUR Sl Cloud[place] (A) 10/31/2018 1041   LABSPEC 1.020 10/31/2018 1041   PHURINE 5.5 10/31/2018 1041   GLUCOSEU NEGATIVE 10/31/2018 1041   HGBUR MODERATE (A) 10/31/2018 1041   BILIRUBINUR NEGATIVE 10/31/2018 1041   KETONESUR NEGATIVE 10/31/2018 1041   UROBILINOGEN 0.2 10/31/2018 1041   NITRITE NEGATIVE 10/31/2018 1041   LEUKOCYTESUR MODERATE (A) 10/31/2018 1041     STUDIES: No results found.   ELIGIBLE FOR AVAILABLE RESEARCH PROTOCOL: no  ASSESSMENT: 87 y.o. [place] woman status post left breast lower inner quadrant biopsy 11/30/2020 for a clinical T2 N0, stage IB invasive ductal carcinoma, grade 1 or 2, estrogen and progesterone receptor positive, HER-2 not amplified, with an MIB-1 of 5%.  (1) status post left breast lower inner quadrant biopsy 12/23/2020 for a pT2 pN0, stage IB invasive ductal  carcinoma, grade 1, with negative margins  (a) a total of 3 left axillary lymph nodes removed, all clear  (2) patient declines genetics testing  (3) patient declines adjuvant radiation  (4) anastrozole started 01/10/2021   PLAN: Molly Tucker is tolerating anastrozole well and the plan will be to continue that for a total of 5 years.  I am setting her up for mammography in October.  She will have a bone density at the same time.  She will then see me late October and from that point assuming all continues well we will start seeing her on a once a year basis  She knows to call for any other issue that may develop before then.  Molly Dad. Toby Breithaupt, MD 02/23/2021 11:42 AM Medical Oncology and Hematology [facility] [place], [place] [address] Tel. [phone]    Fax. [phone]   This document serves as a record of services personally performed by Lurline Del, MD. It was created on his behalf by Wilburn Mylar, a trained medical scribe. The creation of this record is based on the scribe's personal observations and the provider's statements to them.   I, Lurline Del MD, have reviewed the above documentation for accuracy and completeness, and I agree with the above.   *Total Encounter Time as defined by the Centers for Medicare and Medicaid Services includes, in addition to the face-to-face time of a patient visit (documented in the note above) non-face-to-face time: obtaining and reviewing outside history, ordering and reviewing medications, tests or procedures, care coordination (communications with other health care professionals or caregivers) and documentation in the medical record.

## 2021-02-22 ENCOUNTER — Inpatient Hospital Stay: Payer: Medicare Other | Attending: Oncology | Admitting: Oncology

## 2021-02-22 DIAGNOSIS — C50312 Malignant neoplasm of lower-inner quadrant of left female breast: Secondary | ICD-10-CM | POA: Insufficient documentation

## 2021-02-22 DIAGNOSIS — Z170 Estrogen receptor positive status [ER+]: Secondary | ICD-10-CM | POA: Insufficient documentation

## 2021-03-03 ENCOUNTER — Ambulatory Visit: Payer: Medicare Other | Admitting: Physician Assistant

## 2021-03-03 ENCOUNTER — Encounter: Payer: Self-pay | Admitting: Physician Assistant

## 2021-03-03 ENCOUNTER — Other Ambulatory Visit: Payer: Self-pay

## 2021-03-03 VITALS — BP 146/90 | HR 93 | Ht 64.0 in | Wt 138.0 lb

## 2021-03-03 DIAGNOSIS — I10 Essential (primary) hypertension: Secondary | ICD-10-CM | POA: Diagnosis not present

## 2021-03-03 DIAGNOSIS — I4811 Longstanding persistent atrial fibrillation: Secondary | ICD-10-CM

## 2021-03-03 NOTE — Progress Notes (Signed)
Cardiology Office Note Date:  03/03/2021  Patient ID:  Molly, Tucker September 06, 1932, MRN [id number] PCP:  Binnie Rail, MD  Electrophysiologist:  [name]   Chief Complaint: 78mo  History of Present Illness: Molly Tucker is a 87 y.o. female with history of PAFib, HTN, HLD, and osteoporosis, chronic CHF (diastolic).  She comes in today to be seen for [name], last seen by him it looks, 2017, following regularly with [name], NP. Most recently 12/14/20, noted Dec 2021 pt called with recurrent Afib, her amio increased, though wanted to wait until after the holidays to come in.  Amio making her feel a bit nauseated.  Wanted to try DCCV, scheduled for a lumpectomy the following week. Planned to follow up in a month to visit DCCV vs rate control strategy, noting not as symptomatic with her Afib as she had been historically.   I saw her 01/20/21 She is recovering from her lumpectomy, still a bit sore, though doing well. She can tell she is is still in Afib, "something is just different" No CP, no SOB. She thinks she may tend to get a little more swollen intermittently Though does not take the lasix every day. She notes the swelling towrads mid-late day, gone in the morning. No dizzy spells, near syncope or syncope. She is able to do the things she needs and wants to do. From the Afib perspective she thinks what bothers her the most is the stomach upset that she is fairly certain is the amiodarone She had a nose bleed not too long ago that took a while to stop, none again, no bleeding or signs of bleeding otherwise. She was no longer interested in rhythm control, cardioversions and her amiodarone stopped Planned for 32mo f/u  TODAY Feels much better off the amiodarone No CP, no real palpitations or cardiac awareness No SOB, DOE No dizzy spells, near syncope or syncope NO bleeding or signs of bleeding  She wears support stockings when out of the house, has no swelling when  she wakes but accumulates through the day She has had "terrible veins" in her legs for many many years   PAF Hx: Diagnosed April 2016 DCCV: 2016 2017 2018 2020 Failed April 2021  AAD Amiodarone started March 2021   Past Medical History:  Diagnosis Date  . Arthritis   . Atrial tachycardia ([place])   . Breast cancer ([place]) 11/30/2020  . Carotid bruit 2005   right, carotid doppler: <39% occlusion  . Complication of anesthesia   . Dysrhythmia    Chronic A. Fib  . Family history of breast cancer   . Family history of GI tract cancer   . Family history of stomach cancer   . Hyperlipidemia    LDL goal =<120  . Hyperplastic colonic polyp 2006   Dr. Carlean Purl, due 2016  . Hypertension   . Osteoporosis    S/P  biphosphonates x 5 yrs  . Persistent atrial fibrillation ([place])   . PONV (postoperative nausea and vomiting)     Past Surgical History:  Procedure Laterality Date  . BREAST LUMPECTOMY WITH SENTINEL LYMPH NODE BIOPSY Left 12/23/2020   Procedure: LEFT BREAST LUMPECTOMY WITH SENTINEL LYMPH NODE BX;  Surgeon: Stark Klein, MD;  Location: [place];  Service: General;  Laterality: Left;  RNFA, PEC BLOCK; START TIME OF 1030 rm 2 per ivey  . CARDIOVERSION N/A 03/31/2015   Procedure: CARDIOVERSION;  Surgeon: Sanda Klein, MD;  Location: [facility];  Service: Cardiovascular;  Laterality: N/A;  . CARDIOVERSION N/A 12/21/2015   Procedure: CARDIOVERSION;  Surgeon: Lelon Perla, MD;  Location: [facility] ENDOSCOPY;  Service: Cardiovascular;  Laterality: N/A;  . CARDIOVERSION N/A 01/09/2017   Procedure: CARDIOVERSION;  Surgeon: Jerline Pain, MD;  Location: [place];  Service: Cardiovascular;  Laterality: N/A;  . CARDIOVERSION N/A 07/09/2019   Procedure: CARDIOVERSION;  Surgeon: Sanda Klein, MD;  Location: [place] ENDOSCOPY;  Service: Cardiovascular;  Laterality: N/A;  . CARDIOVERSION N/A 01/14/2020   Procedure: CARDIOVERSION;  Surgeon: Geralynn Rile, MD;  Location: [place];   Service: Cardiovascular;  Laterality: N/A;  . CARDIOVERSION N/A 02/23/2020   Procedure: CARDIOVERSION;  Surgeon: Jerline Pain, MD;  Location: [facility] ENDOSCOPY;  Service: Cardiovascular;  Laterality: N/A;  . COLONOSCOPY W/ POLYPECTOMY  2006  . DILATION AND CURETTAGE OF UTERUS    . MOUTH SURGERY  2009-2010   implants, Dr. Marcelyn Ditty    Current Outpatient Medications  Medication Sig Dispense Refill  . acetaminophen (TYLENOL) 500 MG tablet Take 500 mg by mouth every 6 (six) hours as needed for moderate pain.    Marland Kitchen anastrozole (ARIMIDEX) 1 MG tablet Take 1 tablet (1 mg total) by mouth daily. 90 tablet 4  . ELIQUIS 5 MG TABS tablet TAKE 1 TABLET BY MOUTH  TWICE DAILY 180 tablet 1  . furosemide (LASIX) 20 MG tablet Take one tablet by mouth ( 20 mg) daily 90 tablet 3  . metoprolol succinate (TOPROL XL) 25 MG 24 hr tablet Take 1 tablet (25 mg total) by mouth daily. 90 tablet 3   No current facility-administered medications for this visit.    Allergies:   Patient has no known allergies.   Social History:  The patient  reports that she quit smoking about 44 years ago. She has never used smokeless tobacco. She reports that she does not drink alcohol and does not use drugs.   Family History:  The patient's family history includes Arthritis in an other family member; Breast cancer (age of onset: 75) in her sister; Cancer in her paternal aunt and paternal aunt; Coronary artery disease in her father and sister; Intellectual disability in her paternal uncle; Stomach cancer in her maternal uncle.  ROS:  Please see the history of present illness.    All other systems are reviewed and otherwise negative.   PHYSICAL EXAM:  VS:  There were no vitals taken for this visit. BMI: There is no height or weight on file to calculate BMI. Well nourished, well developed, in no acute distress  HEENT: normocephalic, atraumatic  Neck: no JVD, carotid bruits or masses Cardiac:  rreg-irreg, soft SM, no rubs, or  gallops Lungs:  CTA b/l, no wheezing, rhonchi or rales  Abd: soft, nontender MS: no deformity age appropriate atrophy Ext: wearing support hose, no pitting edema, known by prior exam to have numerous spider veins with some large veins as well, some today noted even with the stockings on Skin: warm and dry, no rash Neuro:  No gross deficits appreciated Psych: euthymic mood, full affect   EKG:  Not done today   03/08/2020; TTE IMPRESSIONS  1. Left ventricular ejection fraction, by estimation, is 60 to 65%. The  left ventricle has normal function. The left ventricle has no regional  wall motion abnormalities. Left ventricular diastolic parameters are  consistent with Grade II diastolic  dysfunction (pseudonormalization). Elevated left ventricular end-diastolic  pressure.  2. Right ventricular systolic function is normal. The right ventricular  size is moderately enlarged. There is  moderately elevated pulmonary artery  systolic pressure. The estimated right ventricular systolic pressure is  87.5 mmHg.  3. Left atrial size was moderately dilated.  4. Right atrial size was severely dilated.  5. The mitral valve is normal in structure. Mild mitral valve  regurgitation. No evidence of mitral stenosis.  6. Tricuspid valve regurgitation is moderate.  7. The aortic valve is tricuspid. Aortic valve regurgitation is not  visualized. Mild aortic valve sclerosis is present, with no evidence of  aortic valve stenosis.  8. The inferior vena cava is normal in size with <50% respiratory  variability, suggesting right atrial pressure of 8 mmHg.   03/08/15: Echocardiogram Study Conclusions - Left ventricle: The cavity size was normal. Wall thickness was normal. Systolic function was normal. The estimated ejection fraction was in the range of 55% to 60%. Wall motion was normal; there were no regional wall motion abnormalities. - Right ventricle: The cavity size was mildly dilated. -  Right atrium: The atrium was mildly dilated. - Atrial septum: No defect or patent foramen ovale was identified. - Tricuspid valve: There was moderate regurgitation. LA 47mm  Recent Labs: 11/08/2020: TSH 3.500 01/05/2021: ALT 20; BUN 15; Creatinine 0.90; Hemoglobin 13.9; Platelet Count 199; Potassium 4.5; Sodium 141  04/19/2020: Chol/HDL Ratio 3.3; Cholesterol, Total 195; HDL 59; LDL Chol Calc (NIH) 118; Triglycerides 102   CrCl cannot be calculated (Patient's most recent lab result is older than the maximum 21 days allowed.).   Wt Readings from Last 3 Encounters:  01/20/21 143 lb 12.8 oz (65.2 kg)  01/05/21 143 lb 12.8 oz (65.2 kg)  12/23/20 142 lb 3.2 oz (64.5 kg)     Other studies reviewed: Additional studies/records reviewed today include: summarized above  ASSESSMENT AND PLAN:  1.   Longstanding Persistent AFib       CHADS2Vasc is 5 on Eliquis, appropriately dosed       Rate controlled, reports 70;s usually at home                  2. HTN     She monitors at home is always much better  3. Non-pitting edema     dependent and 2/2 venous insufficiency      Disposition: 84mo, sooner if needed.   Current medicines are reviewed at length with the patient today.  The patient did not have any concerns regarding medicines.  Haywood Lasso, PA-C 03/03/2021 6:20 AM     [facility] [address] [place] [address] [phone] (office)  [phone] (fax)

## 2021-03-03 NOTE — Patient Instructions (Signed)
Medication Instructions:   Your physician recommends that you continue on your current medications as directed. Please refer to the Current Medication list given to you today.'  *If you need a refill on your cardiac medications before your next appointment, please call your pharmacy*   Lab Work: NONE ORDERED  TODAY   If you have labs (blood work) drawn today and your tests are completely normal, you will receive your results only by: . MyChart Message (if you have MyChart) OR . A paper copy in the mail If you have any lab test that is abnormal or we need to change your treatment, we will call you to review the results.   Testing/Procedures: NONE ORDERED  TODAY    Follow-Up: At [facility], you and your health needs are our priority.  As part of our continuing mission to provide you with exceptional heart care, we have created designated Provider Care Teams.  These Care Teams include your primary Cardiologist (physician) and Advanced Practice Providers (APPs -  Physician Assistants and Nurse Practitioners) who all work together to provide you with the care you need, when you need it.  We recommend signing up for the patient portal called "MyChart".  Sign up information is provided on this After Visit Summary.  MyChart is used to connect with patients for Virtual Visits (Telemedicine).  Patients are able to view lab/test results, encounter notes, upcoming appointments, etc.  Non-urgent messages can be sent to your provider as well.   To learn more about what you can do with MyChart, go to [email or web address].    Your next appointment:   6 month(s)  The format for your next appointment:   In Person  Provider:   Gregg Taylor, MD   Other Instructions  

## 2021-04-04 ENCOUNTER — Telehealth: Payer: Self-pay | Admitting: Internal Medicine

## 2021-04-04 NOTE — Telephone Encounter (Signed)
LVM for pt to rtn my call to schedule AWV with [facility]. Please schedule AWV with NHA if pt calls the office.

## 2021-04-06 ENCOUNTER — Telehealth: Payer: Self-pay

## 2021-04-06 NOTE — Progress Notes (Addendum)
Chronic Care Management Pharmacy Assistant   Name: Molly Tucker  MRN: [id number] DOB: [birth year]    Reason for Encounter: Disease State Hypertension Call   Conditions to be addressed/monitored: HTN   Recent office visits:  None ID  Recent consult visits:  12/14/20 NP [name] Card[facility] discontinued maxifloxacin and prednisone 01/05/21 Dr. Gerhard Perches, Oncology ordered anastrozole 1 mg 01/20/21 and 03/03/21 PA [facility] visits:  Medication Reconciliation was completed by comparing discharge summary, patient's EMR and Pharmacy list, and upon discussion with patient.  Admitted to the hospital on 12/23/20 due to left breast lumpectomy. Discharge date was 12/23/20. Discharged from [place]?Medications Started at [facility] Discharge:?? -started None ID  Medication Changes at Hospital Discharge: -Changed None ID  Medications Discontinued at Hospital Discharge: -Stopped None ID  Medications that remain the same after Hospital Discharge:??  -All other medications will remain the same.    Medications: Outpatient Encounter Medications as of 04/06/2021  Medication Sig   acetaminophen (TYLENOL) 500 MG tablet Take 500 mg by mouth every 6 (six) hours as needed for moderate pain.   anastrozole (ARIMIDEX) 1 MG tablet Take 1 tablet (1 mg total) by mouth daily.   ELIQUIS 5 MG TABS tablet TAKE 1 TABLET BY MOUTH  TWICE DAILY   furosemide (LASIX) 20 MG tablet Take one tablet by mouth ( 20 mg) daily   metoprolol succinate (TOPROL XL) 25 MG 24 hr tablet Take 1 tablet (25 mg total) by mouth daily.   No facility-administered encounter medications on file as of 04/06/2021.   Pharmacist Review  Reviewed chart prior to disease state call. Spoke with patient regarding BP  Recent Office Vitals: BP Readings from Last 3 Encounters:  03/03/21 (!) 146/90  01/20/21 (!) 150/90  01/05/21 (!) 185/120   Pulse Readings from Last 3 Encounters:  03/03/21 93   01/20/21 91  01/05/21 93    Wt Readings from Last 3 Encounters:  03/03/21 138 lb (62.6 kg)  01/20/21 143 lb 12.8 oz (65.2 kg)  01/05/21 143 lb 12.8 oz (65.2 kg)     Kidney Function Lab Results  Component Value Date/Time   CREATININE 0.90 01/05/2021 03:34 PM   CREATININE 0.89 12/14/2020 12:39 PM   CREATININE 1.16 (H) 11/08/2020 04:01 PM   CREATININE 0.87 12/14/2015 02:15 PM   CREATININE 0.75 11/21/2013 03:53 PM   GFR 74.92 04/06/2017 09:46 AM   GFRNONAA >60 01/05/2021 03:34 PM   GFRAA 68 12/14/2020 12:39 PM    BMP Latest Ref Rng & Units 01/05/2021 12/14/2020 11/08/2020  Glucose 70 - 99 mg/dL 85 79 89  BUN 8 - 23 mg/dL 15 14 16   Creatinine 0.44 - 1.00 mg/dL 0.90 0.89 1.16(H)  BUN/Creat Ratio 12 - 28 - 16 14  Sodium 135 - 145 mmol/L 141 141 143  Potassium 3.5 - 5.1 mmol/L 4.5 4.6 4.5  Chloride 98 - 111 mmol/L 106 104 104  CO2 22 - 32 mmol/L 26 22 26   Calcium 8.9 - 10.3 mg/dL 8.9 9.1 9.0    Current antihypertensive regimen:  Metoprolol 25 mg daily Furosemide 20 mg daily  How often are you checking your Blood Pressure? 3-5x per week   Current home BP readings: Patient states that blood pressure is below 120s  What recent interventions/DTPs have been made by any provider to improve Blood Pressure control since last CPP Visit: None ID  Any recent hospitalizations or ED visits since last visit with CPP? Yes, patient was  seen at [facility] for breast lumpectomy on 12/23/20  What diet changes have been made to improve Blood Pressure Control?  Patient states she has not made any changes to diet  What exercise is being done to improve your Blood Pressure Control?  Patient has not made any changes to exercise, she is taking care of husband=d who is right now wheel chair bound because of foot injury  Adherence Review: Is the patient currently on ACE/ARB medication? No Does the patient have >5 day gap between last estimated fill dates? Yes  Star Rating Drugs: None ID  [place] Pharmacist Assistant [phone]  Time spent:42

## 2021-04-25 ENCOUNTER — Ambulatory Visit: Payer: Medicare Other | Admitting: Physician Assistant

## 2021-05-04 ENCOUNTER — Other Ambulatory Visit: Payer: Self-pay | Admitting: *Deleted

## 2021-05-04 MED ORDER — APIXABAN 5 MG PO TABS: 5.0000 mg | ORAL_TABLET | Freq: Two times a day (BID) | ORAL | 1 refills | Status: DC

## 2021-05-04 NOTE — Telephone Encounter (Signed)
Prescription refill request for Eliquis received. Indication: afib  Last office visit: Charlcie Cradle, 03/03/2021 Scr: 0.90, 01/05/2021 Age: 87 yo  Weight: 62.6 kg   Pt is on the correct dose of Eliquis per dosing criteria, prescription refill sent for Eliquis 5mg  BID.

## 2021-05-06 ENCOUNTER — Other Ambulatory Visit: Payer: Self-pay | Admitting: *Deleted

## 2021-05-06 MED ORDER — METOPROLOL SUCCINATE ER 25 MG PO TB24: 25.0000 mg | ORAL_TABLET | Freq: Every day | ORAL | 3 refills | Status: DC

## 2021-05-12 ENCOUNTER — Other Ambulatory Visit: Payer: Self-pay

## 2021-05-12 ENCOUNTER — Ambulatory Visit
Admission: RE | Admit: 2021-05-12 | Discharge: 2021-05-12 | Disposition: A | Discharge status: Home / Self Care | Payer: Medicare Other | Source: Ambulatory Visit | Attending: Oncology | Admitting: Oncology

## 2021-05-12 DIAGNOSIS — Z170 Estrogen receptor positive status [ER+]: Secondary | ICD-10-CM

## 2021-05-12 DIAGNOSIS — C50312 Malignant neoplasm of lower-inner quadrant of left female breast: Secondary | ICD-10-CM

## 2021-05-27 ENCOUNTER — Other Ambulatory Visit: Payer: Self-pay

## 2021-05-27 MED ORDER — METOPROLOL SUCCINATE ER 25 MG PO TB24: 25.0000 mg | ORAL_TABLET | Freq: Every day | ORAL | 2 refills | Status: DC

## 2021-07-27 NOTE — Progress Notes (Signed)
Cardiology Office Note Date:  07/27/2021  Patient ID:  Molly, Tucker 1932-04-19, MRN [id number] PCP:  Binnie Rail, MD  Electrophysiologist:  [name]   Chief Complaint:  38mo  History of Present Illness: Molly Tucker is a 87 y.o. female with history of PAFib, HTN, HLD, and osteoporosis, chronic CHF (diastolic).  She comes in today to be seen for [name], last seen by him it looks, 2017, following regularly with [name], NP. Most recently 12/14/20, noted Dec 2021 pt called with recurrent Afib, her amio increased, though wanted to wait until after the holidays to come in.  Amio making her feel a bit nauseated.  Wanted to try DCCV, scheduled for a lumpectomy the following week. Planned to follow up in a month to visit DCCV vs rate control strategy, noting not as symptomatic with her Afib as she had been historically.   I saw her 01/20/21 She is recovering from her lumpectomy, still a bit sore, though doing well. She can tell she is is still in Afib, "something is just different" No CP, no SOB. She thinks she may tend to get a little more swollen intermittently Though does not take the lasix every day. She notes the swelling towrads mid-late day, gone in the morning. No dizzy spells, near syncope or syncope. She is able to do the things she needs and wants to do. From the Afib perspective she thinks what bothers her the most is the stomach upset that she is fairly certain is the amiodarone She had a nose bleed not too long ago that took a while to stop, none again, no bleeding or signs of bleeding otherwise. She was no longer interested in rhythm control, cardioversions and her amiodarone stopped Planned for 40mo f/u  I saw her  Feels much better off the amiodarone No CP, no real palpitations or cardiac awareness No SOB, DOE No dizzy spells, near syncope or syncope NO bleeding or signs of bleeding  She wears support stockings when out of the house, has no swelling  when she wakes but accumulates through the day She has had "terrible veins" in her legs for many many years No changes were made, she was asked to monitor her BP, planned for 23mo visit   TODAY She is all in all doing OK.  Some days she just doesn't think she has as much energy as she did when in SR, though other days feels pretty good. She denies difficulty with ADLs but some days has more energy then others, or better stamina. No CP, no SOB or DOE.  No bleeding or signs of bleeding No near syncope or syncope  She is able to see her pulse in her neck, this is worrisome to her She takes the lasix when she is swelling, will skip it days that she is busy, doesn't take it every day   PAF Hx: Diagnosed April 2016 DCCV: 2016 2017 2018 2020 Failed April 2021  AAD Amiodarone started March 2021 subsequently stopped for rate control strategy march 2022   Past Medical History:  Diagnosis Date   Arthritis    Atrial tachycardia ([place])    Breast cancer ([place]) 11/30/2020   Carotid bruit 2005   right, carotid doppler: <39% occlusion   Complication of anesthesia    Dysrhythmia    Chronic A. Fib   Family history of breast cancer    Family history of GI tract cancer    Family history of stomach cancer  Hyperlipidemia    LDL goal =<120   Hyperplastic colonic polyp 2006   Dr. Carlean Purl, due 2016   Hypertension    Osteoporosis    S/P  biphosphonates x 5 yrs   Persistent atrial fibrillation (HCC)    PONV (postoperative nausea and vomiting)     Past Surgical History:  Procedure Laterality Date   BREAST LUMPECTOMY WITH SENTINEL LYMPH NODE BIOPSY Left 12/23/2020   Procedure: LEFT BREAST LUMPECTOMY WITH SENTINEL LYMPH NODE BX;  Surgeon: Stark Klein, MD;  Location: [facility];  Service: General;  Laterality: Left;  RNFA, PEC BLOCK; START TIME OF 1030 rm 2 per ivey   CARDIOVERSION N/A 03/31/2015   Procedure: CARDIOVERSION;  Surgeon: Sanda Klein, MD;  Location: [facility] ENDOSCOPY;  Service:  Cardiovascular;  Laterality: N/A;   CARDIOVERSION N/A 12/21/2015   Procedure: CARDIOVERSION;  Surgeon: Lelon Perla, MD;  Location: [facility] ENDOSCOPY;  Service: Cardiovascular;  Laterality: N/A;   CARDIOVERSION N/A 01/09/2017   Procedure: CARDIOVERSION;  Surgeon: Jerline Pain, MD;  Location: [facility];  Service: Cardiovascular;  Laterality: N/A;   CARDIOVERSION N/A 07/09/2019   Procedure: CARDIOVERSION;  Surgeon: Sanda Klein, MD;  Location: [place] ENDOSCOPY;  Service: Cardiovascular;  Laterality: N/A;   CARDIOVERSION N/A 01/14/2020   Procedure: CARDIOVERSION;  Surgeon: Geralynn Rile, MD;  Location: [facility];  Service: Cardiovascular;  Laterality: N/A;   CARDIOVERSION N/A 02/23/2020   Procedure: CARDIOVERSION;  Surgeon: Jerline Pain, MD;  Location: [place] ENDOSCOPY;  Service: Cardiovascular;  Laterality: N/A;   COLONOSCOPY W/ POLYPECTOMY  2006   DILATION AND CURETTAGE OF UTERUS     MOUTH SURGERY  2009-2010   implants, Dr. Marcelyn Ditty    Current Outpatient Medications  Medication Sig Dispense Refill   acetaminophen (TYLENOL) 500 MG tablet Take 500 mg by mouth every 6 (six) hours as needed for moderate pain.     anastrozole (ARIMIDEX) 1 MG tablet Take 1 tablet (1 mg total) by mouth daily. 90 tablet 4   apixaban (ELIQUIS) 5 MG TABS tablet Take 1 tablet (5 mg total) by mouth 2 (two) times daily. 180 tablet 1   furosemide (LASIX) 20 MG tablet Take one tablet by mouth ( 20 mg) daily 90 tablet 3   metoprolol succinate (TOPROL XL) 25 MG 24 hr tablet Take 1 tablet (25 mg total) by mouth daily. 90 tablet 2   No current facility-administered medications for this visit.    Allergies:   Patient has no known allergies.   Social History:  The patient  reports that she quit smoking about 44 years ago. She has never used smokeless tobacco. She reports that she does not drink alcohol and does not use drugs.   Family History:  The patient's family history includes Arthritis in an other family member;  Breast cancer (age of onset: 40) in her sister; Cancer in her paternal aunt and paternal aunt; Coronary artery disease in her father and sister; Intellectual disability in her paternal uncle; Stomach cancer in her maternal uncle.  ROS:  Please see the history of present illness.    All other systems are reviewed and otherwise negative.   PHYSICAL EXAM:  VS:  There were no vitals taken for this visit. BMI: There is no height or weight on file to calculate BMI. Well nourished, well developed, in no acute distress  HEENT: normocephalic, atraumatic  Neck: no JVD, carotid bruits or masses Cardiac:   rreg-irreg, soft SM, no rubs, or gallops Lungs:  CTA b/l, no wheezing, rhonchi or  rales  Abd: soft, nontender MS: no deformity age appropriate atrophy Ext: wearing support hose, no pitting edema, known by prior exam to have numerous spider veins with some large veins as well, some today noted even with the stockings on Skin: warm and dry, no rash Neuro:  No gross deficits appreciated Psych: euthymic mood, full affect   EKG:  Not done today   03/08/2020; TTE IMPRESSIONS   1. Left ventricular ejection fraction, by estimation, is 60 to 65%. The  left ventricle has normal function. The left ventricle has no regional  wall motion abnormalities. Left ventricular diastolic parameters are  consistent with Grade II diastolic  dysfunction (pseudonormalization). Elevated left ventricular end-diastolic  pressure.   2. Right ventricular systolic function is normal. The right ventricular  size is moderately enlarged. There is moderately elevated pulmonary artery  systolic pressure. The estimated right ventricular systolic pressure is  25.6 mmHg.   3. Left atrial size was moderately dilated.   4. Right atrial size was severely dilated.   5. The mitral valve is normal in structure. Mild mitral valve  regurgitation. No evidence of mitral stenosis.   6. Tricuspid valve regurgitation is moderate.   7. The  aortic valve is tricuspid. Aortic valve regurgitation is not  visualized. Mild aortic valve sclerosis is present, with no evidence of  aortic valve stenosis.   8. The inferior vena cava is normal in size with <50% respiratory  variability, suggesting right atrial pressure of 8 mmHg.   03/08/15: Echocardiogram Study Conclusions - Left ventricle: The cavity size was normal. Wall thickness was   normal. Systolic function was normal. The estimated ejection   fraction was in the range of 55% to 60%. Wall motion was normal;   there were no regional wall motion abnormalities. - Right ventricle: The cavity size was mildly dilated. - Right atrium: The atrium was mildly dilated. - Atrial septum: No defect or patent foramen ovale was identified. - Tricuspid valve: There was moderate regurgitation. LA 53mm  Recent Labs: 11/08/2020: TSH 3.500 01/05/2021: ALT 20; BUN 15; Creatinine 0.90; Hemoglobin 13.9; Platelet Count 199; Potassium 4.5; Sodium 141  No results found for requested labs within last 8760 hours.   CrCl cannot be calculated (Patient's most recent lab result is older than the maximum 21 days allowed.).   Wt Readings from Last 3 Encounters:  03/03/21 138 lb (62.6 kg)  01/20/21 143 lb 12.8 oz (65.2 kg)  01/05/21 143 lb 12.8 oz (65.2 kg)     Other studies reviewed: Additional studies/records reviewed today include: summarized above  ASSESSMENT AND PLAN:  1.   Longstanding Persistent AFib       CHADS2Vasc is 5 on Eliquis, appropriately dosed       Update labs today  I don't think there are rhythm control options for her She is rate controlled                2. HTN     Looks OK  3. Non-pitting edema     dependent and 2/2 venous insufficiency  4. Chronic CHF (diastolic) Take lasix daily BMET today RVSP on her echo last year was 57, and grade II DD Update her echo    Disposition: follow up on labs and echo otherwise see her in 34mo, sooner if needed.   Current medicines  are reviewed at length with the patient today.  The patient did not have any concerns regarding medicines.  Haywood Lasso, PA-C 07/27/2021 3:45 PM  [place] [address] [phone] (office)  [phone] (fax)

## 2021-08-01 ENCOUNTER — Other Ambulatory Visit: Payer: Self-pay

## 2021-08-01 ENCOUNTER — Encounter: Payer: Self-pay | Admitting: Physician Assistant

## 2021-08-01 ENCOUNTER — Ambulatory Visit: Payer: Medicare Other | Admitting: Physician Assistant

## 2021-08-01 VITALS — BP 140/68 | HR 84 | Ht 64.0 in | Wt 136.2 lb

## 2021-08-01 DIAGNOSIS — I4811 Longstanding persistent atrial fibrillation: Secondary | ICD-10-CM | POA: Diagnosis not present

## 2021-08-01 DIAGNOSIS — I10 Essential (primary) hypertension: Secondary | ICD-10-CM

## 2021-08-01 DIAGNOSIS — I5032 Chronic diastolic (congestive) heart failure: Secondary | ICD-10-CM | POA: Diagnosis not present

## 2021-08-01 DIAGNOSIS — R0602 Shortness of breath: Secondary | ICD-10-CM

## 2021-08-01 NOTE — Patient Instructions (Signed)
Medication Instructions:   Your physician recommends that you continue on your current medications as directed. Please refer to the Current Medication list given to you today.  *If you need a refill on your cardiac medications before your next appointment, please call your pharmacy*   Lab Work:  BMET AND CBC TODAY   If you have labs (blood work) drawn today and your tests are completely normal, you will receive your results only by: [place] (if you have MyChart) OR A paper copy in the mail If you have any lab test that is abnormal or we need to change your treatment, we will call you to review the results.   Testing/Procedures: Your physician has requested that you have an echocardiogram. Echocardiography is a painless test that uses sound waves to create images of your heart. It provides your doctor with information about the size and shape of your heart and how well your heart's chambers and valves are working. This procedure takes approximately one hour. There are no restrictions for this procedure.    Follow-Up: At [facility], you and your health needs are our priority.  As part of our continuing mission to provide you with exceptional heart care, we have created designated Provider Care Teams.  These Care Teams include your primary Cardiologist (physician) and Advanced Practice Providers (APPs -  Physician Assistants and Nurse Practitioners) who all work together to provide you with the care you need, when you need it.  We recommend signing up for the patient portal called "MyChart".  Sign up information is provided on this After Visit Summary.  MyChart is used to connect with patients for Virtual Visits (Telemedicine).  Patients are able to view lab/test results, encounter notes, upcoming appointments, etc.  Non-urgent messages can be sent to your provider as well.   To learn more about what you can do with MyChart, go to [email or web address].    Your next appointment:    6 month(s)  The format for your next appointment:   In Person  Provider:   You may see Dr.Klein. or one of the following Advanced Practice Providers on your designated Care Team:   Tommye Standard, [place]    Other Instructions

## 2021-08-02 LAB — BASIC METABOLIC PANEL
BUN/Creatinine Ratio: 18 (ref 12–28)
BUN: 14 mg/dL (ref 8–27)
CO2: 22 mmol/L (ref 20–29)
Calcium: 9.2 mg/dL (ref 8.7–10.3)
Chloride: 105 mmol/L (ref 96–106)
Creatinine, Ser: 0.77 mg/dL (ref 0.57–1.00)
Glucose: 97 mg/dL (ref 70–99)
Potassium: 4.5 mmol/L (ref 3.5–5.2)
Sodium: 143 mmol/L (ref 134–144)
eGFR: 75 mL/min/{1.73_m2} (ref >59)

## 2021-08-02 LAB — CBC
Hematocrit: 43.3 % (ref 34.0–46.6)
Hemoglobin: 14.2 g/dL (ref 11.1–15.9)
MCH: 30.1 pg (ref 26.6–33.0)
MCHC: 32.8 g/dL (ref 31.5–35.7)
MCV: 92 fL (ref 79–97)
Platelets: 161 10*3/uL (ref 150–450)
RBC: 4.71 x10E6/uL (ref 3.77–5.28)
RDW: 12.3 % (ref 11.7–15.4)
WBC: 4.6 10*3/uL (ref 3.4–10.8)

## 2021-08-11 ENCOUNTER — Other Ambulatory Visit: Payer: Self-pay

## 2021-08-11 MED ORDER — METOPROLOL SUCCINATE ER 25 MG PO TB24: 25.0000 mg | ORAL_TABLET | Freq: Every day | ORAL | 3 refills | Status: DC

## 2021-08-16 ENCOUNTER — Other Ambulatory Visit (HOSPITAL_COMMUNITY): Payer: Medicare Other

## 2021-08-23 ENCOUNTER — Other Ambulatory Visit: Payer: Self-pay

## 2021-08-23 ENCOUNTER — Ambulatory Visit (HOSPITAL_COMMUNITY): Payer: Medicare Other | Attending: Internal Medicine

## 2021-08-23 DIAGNOSIS — R0602 Shortness of breath: Secondary | ICD-10-CM | POA: Diagnosis not present

## 2021-08-23 LAB — ECHOCARDIOGRAM COMPLETE
Area-P 1/2: 4.94 cm2
S' Lateral: 1.8 cm

## 2021-09-09 ENCOUNTER — Ambulatory Visit (INDEPENDENT_AMBULATORY_CARE_PROVIDER_SITE_OTHER): Payer: Medicare Other

## 2021-09-09 DIAGNOSIS — Z23 Encounter for immunization: Secondary | ICD-10-CM

## 2021-09-09 NOTE — Progress Notes (Signed)
Pt given reg flu vacc w/o any complications. 

## 2021-10-20 ENCOUNTER — Ambulatory Visit: Payer: Medicare Other

## 2021-10-20 ENCOUNTER — Other Ambulatory Visit: Payer: Self-pay

## 2021-10-20 DIAGNOSIS — I43 Cardiomyopathy in diseases classified elsewhere: Secondary | ICD-10-CM

## 2021-10-20 DIAGNOSIS — I4819 Other persistent atrial fibrillation: Secondary | ICD-10-CM

## 2021-10-20 NOTE — Progress Notes (Signed)
Per Tommye Standard:  Please schedule for limited echo  strain to assess for cardiac amyloid He would also like her to have 1) urine serum Immunoglobulins with immunofixation  2) urine for light chains  3) Pyrophosphate scan (PYP scan) She is due to see [name] it seems in April, please have them get that scheduled for her.  I have placed orders for PYP scan and urine for light chains as requested will forward back to Pilar Plate, [place] covering for [employer]

## 2021-10-25 ENCOUNTER — Telehealth: Payer: Self-pay | Admitting: *Deleted

## 2021-10-25 NOTE — Telephone Encounter (Signed)
Spoke with patient and aware of labwork to be drawn at [facility] on the first floor. Patient will needs to come to 3 rd floor pick up lab order to take to First floor [employer]. Patient verbalized understanding and will come as soon as she can with husband in Hospice she has to organized her schedule.Patient aware also of test scheduled 11-11-20.

## 2021-10-28 ENCOUNTER — Encounter: Payer: Self-pay | Admitting: Internal Medicine

## 2021-11-01 ENCOUNTER — Telehealth (HOSPITAL_COMMUNITY): Payer: Self-pay | Admitting: Physician Assistant

## 2021-11-01 NOTE — Telephone Encounter (Signed)
Patient called and cancelled Amyloid for reason below:  11/01/2021 12:16 PM GY:IRSW, JENNIFER L  Cancel Rsn: Patient (Husband is sick. Will call back to r/s)  Order will be removed from the [facility]. If patient calls back to reschedule we will reinstate the order.

## 2021-11-03 ENCOUNTER — Other Ambulatory Visit: Payer: Self-pay | Admitting: Internal Medicine

## 2021-11-04 NOTE — Telephone Encounter (Signed)
Prescription refill request for Eliquis received.  Indication: afib  Last office visit: Charlcie Cradle, 08/01/2021 Scr: 0.77, 08/01/2021 Age: 88 yo  Weight:  61.8 kg   Refill sent.

## 2021-11-11 ENCOUNTER — Ambulatory Visit (HOSPITAL_COMMUNITY): Payer: Medicare Other

## 2021-12-30 NOTE — Telephone Encounter (Signed)
NO NOTE NEEDED ?

## 2022-01-06 ENCOUNTER — Other Ambulatory Visit: Payer: Self-pay

## 2022-01-06 MED ORDER — FUROSEMIDE 20 MG PO TABS: ORAL_TABLET | ORAL | 2 refills | Status: DC

## 2022-01-12 ENCOUNTER — Telehealth: Payer: Self-pay

## 2022-01-12 NOTE — Progress Notes (Signed)
? ? ?  Chronic Care Management ?Pharmacy Assistant  ? ?Name: Molly Tucker  MRN: [id number] DOB: December 30, 1931 ? ?Molly Tucker is an 88 y.o. year old female who presents for his follow-up CCM visit with the clinical pharmacist. ? ?Reason for Encounter: Disease State-General ?  ? ?Recent office visits:  ?None ID ? ?Recent consult visits:  ?08/01/22 [place], PA-C- Cardiology ([facility]) Blood work ordered, no meds ? ?Hospital visits:  ?None in previous 6 months ? ?Medications: ?Outpatient Encounter Medications as of 01/12/2022  ?Medication Sig  ? acetaminophen (TYLENOL) 500 MG tablet Take 500 mg by mouth every 6 (six) hours as needed for moderate pain.  ? anastrozole (ARIMIDEX) 1 MG tablet Take 1 tablet (1 mg total) by mouth daily.  ? ELIQUIS 5 MG TABS tablet TAKE 1 TABLET BY MOUTH  TWICE DAILY  ? furosemide (LASIX) 20 MG tablet Take one tablet by mouth ( 20 mg) daily  ? metoprolol succinate (TOPROL XL) 25 MG 24 hr tablet Take 1 tablet (25 mg total) by mouth daily.  ? ?No facility-administered encounter medications on file as of 01/12/2022.  ? ?Have you had any problems recently with your health? Patient states that she is not having any new health issues and all her meds are up to date ? ?Have you had any problems with your pharmacy? Patient states that she does not have any problems with getting medications from the pharmacy or the cost of her meds insurance pays for all ? ?What issues or side effects are you having with your medications?Patient states that she is not having any side effects to medications ? ?What would you like me to pass along to [facility] clinical pharmacist, for them to help you with? Patient states that she is doing well ? ?What can we do to take care of you better? Patient states that she does not need anything at this time ? ?Care Gaps: ?Colonoscopy-NA ?Diabetic Foot Exam-NA ?Mammogram-NA ?Ophthalmology-NA ?Dexa Scan - 02/23/21 ?Annual Well Visit - 09/10/20 ?Micro albumin-NA ?Hemoglobin  A1c- NA ? ?Star Rating Drugs: ?None ID ? ?Ethelene Hal ?Clinical Pharmacist Assistant ?[phone]  ?

## 2022-01-13 ENCOUNTER — Ambulatory Visit
Admission: RE | Admit: 2022-01-13 | Discharge: 2022-01-13 | Disposition: A | Discharge status: Home / Self Care | Payer: Medicare Other | Source: Ambulatory Visit | Attending: Oncology | Admitting: Oncology

## 2022-01-13 ENCOUNTER — Other Ambulatory Visit: Payer: Self-pay | Admitting: Hematology and Oncology

## 2022-01-13 DIAGNOSIS — Z170 Estrogen receptor positive status [ER+]: Secondary | ICD-10-CM

## 2022-01-13 DIAGNOSIS — R922 Inconclusive mammogram: Secondary | ICD-10-CM | POA: Diagnosis not present

## 2022-01-18 ENCOUNTER — Encounter: Payer: Self-pay | Admitting: Internal Medicine

## 2022-01-20 ENCOUNTER — Telehealth: Payer: Self-pay | Admitting: Hematology and Oncology

## 2022-01-20 NOTE — Telephone Encounter (Signed)
.  Called pt per 3/24 inbasket , Patient was unavailable, a message with appt time and date was left with number on file.   ?

## 2022-01-23 ENCOUNTER — Inpatient Hospital Stay: Payer: Medicare Other | Admitting: Hematology and Oncology

## 2022-01-23 NOTE — Progress Notes (Signed)
? ? ?Subjective:  ? ? Patient ID: Molly Tucker, female    DOB: 05/25/1932, 88 y.o.   MRN: [id number] ? ?This visit occurred during the SARS-CoV-2 public health emergency.  Safety protocols were in place, including screening questions prior to the visit, additional usage of staff PPE, and extensive cleaning of exam room while observing appropriate contact time as indicated for disinfecting solutions. ? ? ? ?HPI ?Molly Tucker is here for  ?Chief Complaint  ?Patient presents with  ? URI  ? ? ?She is here for an acute visit for cold symptoms.  ? ?Her symptoms started over one week ago.   ? ?She is experiencing fits of coughing that have been bad - she has not been able to get her breath.  She states head congestion, mild ST, ear pain, ? ?She has tried taking vicks cold and flu daytime and nighttime.  Delsym cough syrup, airborne, cough drops.  ? ? ?Covid test negative.   Today is the first day she has felt a little better.   ? ? ?Wakes up in middle of night with left arm pain all the way to her fingers.  It happened once in the right arm.  It lasts less than one minutes. Ususaaly when sleeping - would wake her up.  Occurred several times in the left arm, once in the right arm.   ? ?She has numbness in her left first and 3rd finger tips.   ? ?Occ neck pain - msk, nothing chronic.   ? ? ? ? ?Medications and allergies reviewed with patient and updated if appropriate. ? ?Current Outpatient Medications on File Prior to Visit  ?Medication Sig Dispense Refill  ? acetaminophen (TYLENOL) 500 MG tablet Take 500 mg by mouth every 6 (six) hours as needed for moderate pain.    ? anastrozole (ARIMIDEX) 1 MG tablet Take 1 tablet (1 mg total) by mouth daily. 90 tablet 4  ? ELIQUIS 5 MG TABS tablet TAKE 1 TABLET BY MOUTH  TWICE DAILY 180 tablet 1  ? furosemide (LASIX) 20 MG tablet Take one tablet by mouth ( 20 mg) daily 90 tablet 2  ? metoprolol succinate (TOPROL XL) 25 MG 24 hr tablet Take 1 tablet (25 mg total) by mouth daily. 90 tablet  3  ? ?No current facility-administered medications on file prior to visit.  ? ? ?Review of Systems  ?Constitutional:  Negative for chills and fever.  ?HENT:  Positive for congestion (mild), ear pain and sore throat (mild). Negative for sinus pressure and sinus pain.   ?Respiratory:  Positive for cough and chest tightness (a little). Negative for shortness of breath and wheezing.   ?Gastrointestinal:  Positive for nausea (occ).  ?Neurological:  Negative for dizziness, light-headedness and headaches.  ? ?   ?Objective:  ? ?Vitals:  ? 01/24/22 1040  ?BP: 116/78  ?Pulse: 73  ?Temp: 98.6 ?F (37 ?C)  ?SpO2: 95%  ? ?BP Readings from Last 3 Encounters:  ?01/24/22 116/78  ?08/01/21 140/68  ?03/03/21 (!) 146/90  ? ?Wt Readings from Last 3 Encounters:  ?01/24/22 138 lb (62.6 kg)  ?08/01/21 136 lb 3.2 oz (61.8 kg)  ?03/03/21 138 lb (62.6 kg)  ? ?Body mass index is 23.69 kg/m?. ? ?  ?Physical Exam ?Constitutional:   ?   General: She is not in acute distress. ?   Appearance: Normal appearance. She is not ill-appearing.  ?HENT:  ?   Head: Normocephalic and atraumatic.  ?   Right Ear: Tympanic membrane, ear  canal and external ear normal.  ?   Left Ear: Tympanic membrane, ear canal and external ear normal.  ?   Mouth/Throat:  ?   Mouth: Mucous membranes are moist.  ?   Pharynx: No oropharyngeal exudate or posterior oropharyngeal erythema.  ?Eyes:  ?   Conjunctiva/sclera: Conjunctivae normal.  ?Cardiovascular:  ?   Rate and Rhythm: Normal rate. Rhythm irregular.  ?   Heart sounds: Murmur heard.  ?Pulmonary:  ?   Effort: Pulmonary effort is normal. No respiratory distress.  ?   Breath sounds: Normal breath sounds. No wheezing or rales.  ?Musculoskeletal:  ?   Cervical back: Neck supple. No tenderness.  ?Lymphadenopathy:  ?   Cervical: No cervical adenopathy.  ?Skin: ?   General: Skin is warm and dry.  ?Neurological:  ?   Mental Status: She is alert.  ? ?   ? ? ? ? ? ?Assessment & Plan:  ? ? ?See Problem List for Assessment and Plan of  chronic medical problems.  ? ? ? ? ?

## 2022-01-23 NOTE — Patient Instructions (Addendum)
Return if symptoms worsen or fail to improve. 

## 2022-01-24 ENCOUNTER — Ambulatory Visit (INDEPENDENT_AMBULATORY_CARE_PROVIDER_SITE_OTHER): Payer: Medicare Other | Admitting: Internal Medicine

## 2022-01-24 ENCOUNTER — Encounter: Payer: Self-pay | Admitting: Internal Medicine

## 2022-01-24 DIAGNOSIS — J069 Acute upper respiratory infection, unspecified: Secondary | ICD-10-CM | POA: Diagnosis not present

## 2022-01-24 DIAGNOSIS — M5412 Radiculopathy, cervical region: Secondary | ICD-10-CM | POA: Diagnosis not present

## 2022-01-24 MED ORDER — HYDROCOD POLI-CHLORPHE POLI ER 10-8 MG/5ML PO SUER: 5.0000 mL | Freq: Two times a day (BID) | ORAL | 0 refills | Status: DC | PRN

## 2022-01-24 MED ORDER — BENZONATATE 200 MG PO CAPS: 200.0000 mg | ORAL_CAPSULE | Freq: Three times a day (TID) | ORAL | 0 refills | Status: DC | PRN

## 2022-01-24 NOTE — Assessment & Plan Note (Signed)
Acute ?Pain/numbness in the left arm and once in the right arm on several occasions sounds like cervical radicular pain ?Numbness in 2 of her fingertips on her left hand are likely the same ?Typically this occurs at night and is likely positional ?No significant neck pain ?Discussed options of having further evaluation, but she would like to just monitor for now since its only happened a few times and she will let me know if it gets worse or persists ?

## 2022-01-24 NOTE — Assessment & Plan Note (Signed)
Acute ?Symptoms likely viral in nature ?COVID test at home was negative ?Continue symptomatic treatment with over-the-counter cold medications, Tylenol/ibuprofen ?Has some Tessalon Perles and Hycodan cough syrup at home that she can use ?Increase rest and fluids ?Call if symptoms worsen or do not improve  ?

## 2022-01-30 ENCOUNTER — Other Ambulatory Visit: Payer: Self-pay

## 2022-01-30 MED ORDER — ANASTROZOLE 1 MG PO TABS: 1.0000 mg | ORAL_TABLET | Freq: Every day | ORAL | 3 refills | Status: DC

## 2022-02-06 ENCOUNTER — Inpatient Hospital Stay: Payer: Medicare Other | Attending: Hematology and Oncology | Admitting: Hematology and Oncology

## 2022-02-06 ENCOUNTER — Encounter: Payer: Self-pay | Admitting: Hematology and Oncology

## 2022-02-06 ENCOUNTER — Other Ambulatory Visit: Payer: Self-pay

## 2022-02-06 VITALS — BP 153/98 | HR 99 | Temp 97.7°F | Resp 18 | Ht 64.0 in | Wt 135.3 lb

## 2022-02-06 DIAGNOSIS — Z79899 Other long term (current) drug therapy: Secondary | ICD-10-CM | POA: Insufficient documentation

## 2022-02-06 DIAGNOSIS — Z79811 Long term (current) use of aromatase inhibitors: Secondary | ICD-10-CM | POA: Diagnosis not present

## 2022-02-06 DIAGNOSIS — M8580 Other specified disorders of bone density and structure, unspecified site: Secondary | ICD-10-CM | POA: Insufficient documentation

## 2022-02-06 DIAGNOSIS — C50312 Malignant neoplasm of lower-inner quadrant of left female breast: Secondary | ICD-10-CM | POA: Diagnosis not present

## 2022-02-06 DIAGNOSIS — Z170 Estrogen receptor positive status [ER+]: Secondary | ICD-10-CM | POA: Insufficient documentation

## 2022-02-06 NOTE — Progress Notes (Signed)
?[facility]  ?[phone] [phone]  ? ? ? ?ID: Molly Tucker DOB: 1932-06-03  MR#: [id number]  VIF#:[id number] ? ?Patient Care Team: ?Binnie Rail, MD as PCP - General (Internal Medicine) ?Deboraha Sprang, MD as PCP - Cardiology (Cardiology) ?Foltanski, [place], [facility] as Pharmacist (Pharmacist) ?Melissa Noon, [place] as Referring Physician (Optometry) ?Katy Apo, MD as Consulting Physician (Ophthalmology) ?Mauro Kaufmann, RN as Oncology Nurse Navigator ?Rockwell Germany, RN as Oncology Nurse Navigator ?Benay Pike, MD ? ? ?CHIEF COMPLAINT: estrogen receptor positive breast cancer ? ?CURRENT TREATMENT: Anastrozole ? ? ?INTERVAL HISTORY: ? ?She is here for follow up on anastrazole. ?She has been tolerating it well. She denies any adverse effects with it. ?She has not noticed any changes in her breast.  No change in her bowel or urinary habits.  She tells me that she is not interested in going for a bone density scan or getting any treatment for osteopenia/osteoporosis.  Many years ago she was on Fosamax. ?Rest of the pertinent 10 point ROS reviewed and negative. ? ? COVID 19 VACCINATION STATUS: [place] x2, most recently 12/2019 ? ? ?HISTORY OF CURRENT ILLNESS: ?From the original intake note: ? ?Molly Tucker herself palpated a retroareolar left breast mass. She underwent bilateral diagnostic mammography with tomography and left breast ultrasonography at [facility] on 11/02/2020 showing: breast density category B; palpable 2.3 cm indeterminate mass in retroareolar left breast at 8 o'clock, involving skin and underlying breast; normal-appearing left axillary lymph nodes. ? ?Accordingly on 11/30/2020 she proceeded to biopsy of the left breast area in question. The pathology from this procedure ([id number]) showed: invasive ductal carcinoma, grade 1-2, with extracellular mucin; ductal carcinoma in situ. Prognostic indicators significant for: estrogen receptor, 100% positive and  progesterone receptor, 100% positive, both with strong staining intensity. Proliferation marker Ki67 at 5%. HER2 negative by immunohistochemistry (1+). ? ?She proceeded to left lumpectomy on 12/23/2020 under Dr. Barry Dienes. Pathology from the procedure ([id number]) showing: invasive ductal carcinoma with extracellular mucin, 2.7 cm, grade 1; ductal carcinoma in situ; margins not involved. ? ?All three biopsied lymph nodes were negative for metastatic carcinoma (0/3). ? ? Cancer Staging  ?Malignant neoplasm of lower-inner quadrant of left breast in female, estrogen receptor positive ([place]) ?Staging form: Breast, AJCC 8th Edition ?- Clinical stage from 11/30/2020: Stage IB (cT2, cN0, cM0, G2, ER+, PR+, HER2-) - Signed by Chauncey Cruel, MD on 01/05/2021 ?Stage prefix: Initial diagnosis ? ?The patient's subsequent history is as detailed below. ? ? ?PAST MEDICAL HISTORY: ?Past Medical History:  ?Diagnosis Date  ? Arthritis   ? Atrial tachycardia ([place])   ? Breast cancer ([place]) 11/30/2020  ? Carotid bruit 2005  ? right, carotid doppler: <39% occlusion  ? Complication of anesthesia   ? Dysrhythmia   ? Chronic A. Fib  ? Family history of breast cancer   ? Family history of GI tract cancer   ? Family history of stomach cancer   ? Hyperlipidemia   ? LDL goal =<120  ? Hyperplastic colonic polyp 2006  ? Dr. Carlean Purl, due 2016  ? Hypertension   ? Osteoporosis   ? S/P  biphosphonates x 5 yrs  ? Persistent atrial fibrillation ([place])   ? PONV (postoperative nausea and vomiting)   ? ? ?PAST SURGICAL HISTORY: ?Past Surgical History:  ?Procedure Laterality Date  ? BREAST LUMPECTOMY    ? BREAST LUMPECTOMY WITH SENTINEL LYMPH NODE BIOPSY Left 12/23/2020  ? Procedure: LEFT BREAST LUMPECTOMY WITH SENTINEL  LYMPH NODE BX;  Surgeon: Stark Klein, MD;  Location: [facility];  Service: General;  Laterality: Left;  RNFA, PEC BLOCK; START TIME OF 1030 rm 2 per ivey  ? CARDIOVERSION N/A 03/31/2015  ? Procedure: CARDIOVERSION;  Surgeon: Sanda Klein, MD;   Location: [facility] ENDOSCOPY;  Service: Cardiovascular;  Laterality: N/A;  ? CARDIOVERSION N/A 12/21/2015  ? Procedure: CARDIOVERSION;  Surgeon: Lelon Perla, MD;  Location: [facility];  Service: Cardiovascular;  Laterality: N/A;  ? CARDIOVERSION N/A 01/09/2017  ? Procedure: CARDIOVERSION;  Surgeon: Jerline Pain, MD;  Location: [facility];  Service: Cardiovascular;  Laterality: N/A;  ? CARDIOVERSION N/A 07/09/2019  ? Procedure: CARDIOVERSION;  Surgeon: Sanda Klein, MD;  Location: [facility];  Service: Cardiovascular;  Laterality: N/A;  ? CARDIOVERSION N/A 01/14/2020  ? Procedure: CARDIOVERSION;  Surgeon: Geralynn Rile, MD;  Location: [place];  Service: Cardiovascular;  Laterality: N/A;  ? CARDIOVERSION N/A 02/23/2020  ? Procedure: CARDIOVERSION;  Surgeon: Jerline Pain, MD;  Location: [facility] ENDOSCOPY;  Service: Cardiovascular;  Laterality: N/A;  ? COLONOSCOPY W/ POLYPECTOMY  10/30/2004  ? DILATION AND CURETTAGE OF UTERUS    ? MOUTH SURGERY  2009-2010  ? implants, Dr. Marcelyn Ditty  ? ? ?FAMILY HISTORY: ?Family History  ?Problem Relation Age of Onset  ? Coronary artery disease Father   ?     MI in 25s  ? Coronary artery disease Sister   ?     stent 2009  ? Breast cancer Sister 66  ?     bilateral, diagnosed in other breast at age 15  ? Cancer Paternal Aunt   ?     colon, possible breast cancer, dx 50s/60s  ? Arthritis Other   ?     aunts  ? Stomach cancer Maternal Uncle   ?     dx 58s  ? Intellectual disability Paternal Uncle   ? Cancer Paternal Aunt   ?     gastrointestinal, dx 50s/60s  ? Stroke Neg Hx   ? Her mother died at age 77 with congestive heart failure. Her father died at age 23 from heart disease. Abbigail has 2 brothers and 4 sisters. She reports bilateral breast cancers in one sister-- the first at age 28, and the other breast at age 84.  She also has 2 paternal aunts that she believes had breast cancer ? ? ?GYNECOLOGIC HISTORY:  ?No LMP recorded. Patient is postmenopausal. ?Menarche: 88  years old ?Age at first live birth: 88 years old ?GX P 2 ?LMP ?HRT never used  ?Hysterectomy? no ?BSO? no ? ? ?SOCIAL HISTORY: (updated 12/2020)  ?Shelsey is currently retired from working for [employer].  Her husband is 11, has some speech difficulties, and has a colostomy.  He has no use of his right hand which means she ends up being the caregiver for that.  Also at home is the patient's son Rodman Key who drives a truck.  Son Shanon Brow lives in [place] and works in Press photographer.  The patient has 2 grandsons 1 granddaughter and one great granddaughter.  She belongs to the [employer] [facility]  ? ? ADVANCED DIRECTIVES: She has named her son Rodman Key as her healthcare power of attorney ? ? ?HEALTH MAINTENANCE: ?Social History  ? ?Tobacco Use  ? Smoking status: Former  ?  Types: Cigarettes  ?  Quit date: 10/30/1976  ?  Years since quitting: 45.3  ? Smokeless tobacco: Never  ? Tobacco comments:  ?  Patient would ONLY smoke occasionally   ?  Vaping Use  ? Vaping Use: Never used  ?Substance Use Topics  ? Alcohol use: No  ? Drug use: No  ? ? ? Colonoscopy:  ? PAP: ? Bone density: Remote; took alendronate at some point ?  ?No Known Allergies ? ?Current Outpatient Medications  ?Medication Sig Dispense Refill  ? anastrozole (ARIMIDEX) 1 MG tablet Take 1 tablet (1 mg total) by mouth daily. 90 tablet 3  ? acetaminophen (TYLENOL) 500 MG tablet Take 500 mg by mouth every 6 (six) hours as needed for moderate pain.    ? benzonatate (TESSALON) 200 MG capsule Take 1 capsule (200 mg total) by mouth 3 (three) times daily as needed for cough. 30 capsule 0  ? chlorpheniramine-HYDROcodone (TUSSIONEX PENNKINETIC ER) 10-8 MG/5ML Take 5 mLs by mouth every 12 (twelve) hours as needed for cough. 115 mL 0  ? ELIQUIS 5 MG TABS tablet TAKE 1 TABLET BY MOUTH  TWICE DAILY 180 tablet 1  ? furosemide (LASIX) 20 MG tablet Take one tablet by mouth ( 20 mg) daily 90 tablet 2  ? metoprolol succinate (TOPROL XL) 25 MG 24 hr tablet Take 1 tablet (25 mg total)  by mouth daily. 90 tablet 3  ? ?No current facility-administered medications for this visit.  ? ? ?OBJECTIVE: ? ?Vitals:  ? 02/06/22 1335  ?BP: (!) 153/98  ?Pulse: 99  ?Resp: 18  ?Temp: 97.7 ?F (36.5 ?

## 2022-02-27 ENCOUNTER — Encounter: Payer: Self-pay | Admitting: Internal Medicine

## 2022-03-01 NOTE — Progress Notes (Signed)
? ? ?  Subjective:  ? ? Patient ID: Molly Tucker, female    DOB: 12-13-1931, 88 y.o.   MRN: [id number] ? ?This visit occurred during the SARS-CoV-2 public health emergency.  Safety protocols were in place, including screening questions prior to the visit, additional usage of staff PPE, and extensive cleaning of exam room while observing appropriate contact time as indicated for disinfecting solutions. ? ? ? ?HPI ?Molly Tucker is here for  ?Chief Complaint  ?Patient presents with  ? Anxiety  ? Insomnia  ? ? ?She has difficulty sleeping on occasion.  It is not a nightly thing.  She thinks sometimes she gets thinking about things or worries little.  During the day she denies any anxiety.  She denies depression.  She did take half of her deceased husbands clonazepam, which would be 0.25 mg and that worked well and she was wondering if she could have a prescription. ? ? ?Nocuturia 2-3 / night.  Sometimes difficulty getting back to sleep.   ? ? ?Wants to take it occasionally not nightly and not during the day.   ? ? ?Medications and allergies reviewed with patient and updated if appropriate. ? ?Current Outpatient Medications on File Prior to Visit  ?Medication Sig Dispense Refill  ? acetaminophen (TYLENOL) 500 MG tablet Take 500 mg by mouth every 6 (six) hours as needed for moderate pain.    ? anastrozole (ARIMIDEX) 1 MG tablet Take 1 tablet (1 mg total) by mouth daily. 90 tablet 3  ? ELIQUIS 5 MG TABS tablet TAKE 1 TABLET BY MOUTH  TWICE DAILY 180 tablet 1  ? furosemide (LASIX) 20 MG tablet Take one tablet by mouth ( 20 mg) daily 90 tablet 2  ? metoprolol succinate (TOPROL XL) 25 MG 24 hr tablet Take 1 tablet (25 mg total) by mouth daily. 90 tablet 3  ? ?No current facility-administered medications on file prior to visit.  ? ? ?Review of Systems ? ?   ?Objective:  ? ?Vitals:  ? 03/02/22 1002  ?BP: (!) 142/70  ?Pulse: 80  ?Temp: 98.1 ?F (36.7 ?C)  ?SpO2: 97%  ? ?BP Readings from Last 3 Encounters:  ?03/02/22 (!) 142/70   ?02/06/22 (!) 153/98  ?01/24/22 116/78  ? ?Wt Readings from Last 3 Encounters:  ?03/02/22 132 lb (59.9 kg)  ?02/06/22 135 lb 4.8 oz (61.4 kg)  ?01/24/22 138 lb (62.6 kg)  ? ?Body mass index is 22.66 kg/m?. ? ?  ?Physical Exam ?Constitutional:   ?   General: She is not in acute distress. ?   Appearance: Normal appearance.  ?HENT:  ?   Head: Normocephalic and atraumatic.  ?Skin: ?   General: Skin is warm and dry.  ?Neurological:  ?   Mental Status: She is alert. Mental status is at baseline.  ?Psychiatric:     ?   Mood and Affect: Mood normal.     ?   Behavior: Behavior normal.     ?   Thought Content: Thought content normal.     ?   Judgment: Judgment normal.  ? ?   ? ? ? ? ? ?Assessment & Plan:  ? ? ?See Problem List for Assessment and Plan of chronic medical problems.  ? ? ? ? ?

## 2022-03-02 ENCOUNTER — Ambulatory Visit (INDEPENDENT_AMBULATORY_CARE_PROVIDER_SITE_OTHER): Payer: Medicare Other | Admitting: Internal Medicine

## 2022-03-02 ENCOUNTER — Encounter: Payer: Self-pay | Admitting: Internal Medicine

## 2022-03-02 DIAGNOSIS — G4700 Insomnia, unspecified: Secondary | ICD-10-CM | POA: Insufficient documentation

## 2022-03-02 DIAGNOSIS — F5101 Primary insomnia: Secondary | ICD-10-CM | POA: Diagnosis not present

## 2022-03-02 MED ORDER — CLONAZEPAM 0.5 MG PO TABS: 0.2500 mg | ORAL_TABLET | Freq: Every evening | ORAL | 3 refills | Status: DC | PRN

## 2022-03-02 NOTE — Assessment & Plan Note (Signed)
New ?Occasional difficulty getting to sleep and getting back to sleep once awake ?No major anxiety/ depression ?Discussed pros/cons of clonazepam, but she would like to try it, including possible side effects - short and long term ?Will take only as needed - clonazepam 0.25 mg HS prn ?

## 2022-03-02 NOTE — Patient Instructions (Addendum)
? ?   ? ? ?  Medications changes include :   clonazepam 0.25 mg bedtime as needed ? ? ? ?Your prescription(s) have been sent to your pharmacy.  ? ?

## 2022-03-14 ENCOUNTER — Encounter: Payer: Self-pay | Admitting: Internal Medicine

## 2022-04-15 ENCOUNTER — Other Ambulatory Visit: Payer: Self-pay | Admitting: Internal Medicine

## 2022-04-17 NOTE — Telephone Encounter (Signed)
Prescription refill request for Eliquis received. Indication:Afib Last office visit:10/22 Scr:0.7 Age: 88 Weight:59.9 kg  Prescription refilled

## 2022-05-23 NOTE — Progress Notes (Unsigned)
Cardiology Office Note Date:  05/23/2022  Patient ID:  Molly Tucker, Molly Tucker 1932/03/22, MRN [id number] PCP:  Binnie Rail, MD  Electrophysiologist:  [name]   Chief Complaint:  *** 25mo History of Present Illness: HLITITIA SENis a 88y.o. female with history of PAFib, HTN, HLD, and osteoporosis, chronic CHF (diastolic).  She comes in today to be seen for Dr. KCaryl Comes last seen by him it looks, 2017, following regularly with LTommas Olp NP. Most recently 12/14/20, noted Dec 2021 pt called with recurrent Afib, her amio increased, though wanted to wait until after the holidays to come in.  Amio making her feel a bit nauseated.  Wanted to try DCCV, scheduled for a lumpectomy the following week. Planned to follow up in a month to visit DCCV vs rate control strategy, noting not as symptomatic with her Afib as she had been historically.   I saw her 01/20/21 She is recovering from her lumpectomy, still a bit sore, though doing well. She can tell she is is still in Afib, "something is just different" No CP, no SOB. She thinks she may tend to get a little more swollen intermittently Though does not take the lasix every day. She notes the swelling towrads mid-late day, gone in the morning. No dizzy spells, near syncope or syncope. She is able to do the things she needs and wants to do. From the Afib perspective she thinks what bothers her the most is the stomach upset that she is fairly certain is the amiodarone She had a nose bleed not too long ago that took a while to stop, none again, no bleeding or signs of bleeding otherwise. She was no longer interested in rhythm control, cardioversions and her amiodarone stopped Planned for 635mo/u  I saw her  Feels much better off the amiodarone No CP, no real palpitations or cardiac awareness No SOB, DOE No dizzy spells, near syncope or syncope NO bleeding or signs of bleeding  She wears support stockings when out of the house, has no  swelling when she wakes but accumulates through the day She has had "terrible veins" in her legs for many many years No changes were made, she was asked to monitor her BP, planned for 81m31mosit   I saw her 08/01/22 She is all in all doing OK.  Some days she just doesn't think she has as much energy as she did when in SR, though other days feels pretty good. She denies difficulty with ADLs but some days has more energy then others, or better stamina. No CP, no SOB or DOE. No bleeding or signs of bleeding No near syncope or syncope She is able to see her pulse in her neck, this is worrisome to her She takes the lasix when she is swelling, will skip it days that she is busy, doesn't take it every day   I did not think she had further rhythm control options, HR was good Planned for daily lasix with some volume, and updating her echo  LVEF 70-75%, hyperdynamic, no WMA, mod LVH RV enlargement with mod elevated p. Pressures septum with speckled appearance  consider ordering a limited echocardiogram with strain to assess for  cardiac amyloid.  Reviewed with Dr. KleCaryl Comeso recommended limited echo  strain to assess for cardiac amyloid 1) urine serum Immunoglobulins with immunofixation  2) urine for light chains  3) Pyrophosphate scan (PYP scan)  She has deferred the above 2/2 husband's illness.  ***  symptoms *** volume *** rate *** eliquis, bleeding, labs, dose *** does she want to pursue testing?  PAF Hx: Diagnosed April 2016 DCCV: 2016 2017 2018 2020 Failed April 2021  AAD Amiodarone started March 2021 subsequently stopped for rate control strategy march 2022   Past Medical History:  Diagnosis Date   Arthritis    Atrial tachycardia ([place])    Breast cancer ([place]) 11/30/2020   Carotid bruit 2005   right, carotid doppler: <39% occlusion   Complication of anesthesia    Dysrhythmia    Chronic A. Fib   Family history of breast cancer    Family history of GI tract cancer     Family history of stomach cancer    Hyperlipidemia    LDL goal =<120   Hyperplastic colonic polyp 2006   Dr. Carlean Purl, due 2016   Hypertension    Osteoporosis    S/P  biphosphonates x 5 yrs   Persistent atrial fibrillation (HCC)    PONV (postoperative nausea and vomiting)     Past Surgical History:  Procedure Laterality Date   BREAST LUMPECTOMY     BREAST LUMPECTOMY WITH SENTINEL LYMPH NODE BIOPSY Left 12/23/2020   Procedure: LEFT BREAST LUMPECTOMY WITH SENTINEL LYMPH NODE BX;  Surgeon: Stark Klein, MD;  Location: [facility];  Service: General;  Laterality: Left;  RNFA, PEC BLOCK; START TIME OF 1030 rm 2 per ivey   CARDIOVERSION N/A 03/31/2015   Procedure: CARDIOVERSION;  Surgeon: Sanda Klein, MD;  Location: [place] [facility] ENDOSCOPY;  Service: Cardiovascular;  Laterality: N/A;   CARDIOVERSION N/A 12/21/2015   Procedure: CARDIOVERSION;  Surgeon: Lelon Perla, MD;  Location[facility]COPY;  Service: Cardiovascular;  Laterality: N/A;   CARDIOVERSION N/A 01/09/2017   Procedure: CARDIOVERSION;  Surgeon: Jerline Pain, MD;  Location: [place];  Service: Cardiovascular;  Laterality: N/A;   CARDIOVERSION N/A 07/09/2019   Procedure: CARDIOVERSION;  Surgeon: Sanda Klein, MD;  Location: [place] ENDOSCOPY;  Service: Cardiovascular;  Laterality: N/A;   CARDIOVERSION N/A 01/14/2020   Procedure: CARDIOVERSION;  Surgeon: Geralynn Rile, MD;  Location: [place];  Service: Cardiovascular;  Laterality: N/A;   CARDIOVERSION N/A 02/23/2020   Procedure: CARDIOVERSION;  Surgeon: Jerline Pain, MD;  Location: [facility] ENDOSCOPY;  Service: Cardiovascular;  Laterality: N/A;   COLONOSCOPY W/ POLYPECTOMY  10/30/2004   DILATION AND CURETTAGE OF UTERUS     MOUTH SURGERY  2009-2010   implants, Dr. Marcelyn Ditty    Current Outpatient Medications  Medication Sig Dispense Refill   acetaminophen (TYLENOL) 500 MG tablet Take 500 mg by mouth every 6 (six) hours as needed for moderate pain.     anastrozole (ARIMIDEX) 1  MG tablet Take 1 tablet (1 mg total) by mouth daily. 90 tablet 3   clonazePAM (KLONOPIN) 0.5 MG tablet Take 0.5-1 tablets (0.25-0.5 mg total) by mouth at bedtime as needed for anxiety. 30 tablet 3   ELIQUIS 5 MG TABS tablet TAKE 1 TABLET BY MOUTH TWICE  DAILY 180 tablet 1   furosemide (LASIX) 20 MG tablet Take one tablet by mouth ( 20 mg) daily 90 tablet 2   metoprolol succinate (TOPROL XL) 25 MG 24 hr tablet Take 1 tablet (25 mg total) by mouth daily. 90 tablet 3   No current facility-administered medications for this visit.    Allergies:   Patient has no known allergies.   Social History:  The patient  reports that she quit smoking about 45 years ago. Her smoking use included cigarettes. She has never used smokeless  tobacco. She reports that she does not drink alcohol and does not use drugs.   Family History:  The patient's family history includes Arthritis in an other family member; Breast cancer (age of onset: 15) in her sister; Cancer in her paternal aunt and paternal aunt; Coronary artery disease in her father and sister; Intellectual disability in her paternal uncle; Stomach cancer in her maternal uncle.  ROS:  Please see the history of present illness.    All other systems are reviewed and otherwise negative.   PHYSICAL EXAM:  VS:  There were no vitals taken for this visit. BMI: There is no height or weight on file to calculate BMI. Well nourished, well developed, in no acute distress  HEENT: normocephalic, atraumatic  Neck: no JVD, carotid bruits or masses Cardiac:   rreg-irreg, soft SM, no rubs, or gallops Lungs:  CTA b/l, no wheezing, rhonchi or rales  Abd: soft, nontender MS: no deformity age appropriate atrophy Ext: wearing support hose, no pitting edema, known by prior exam to have numerous spider veins with some large veins as well, some today noted even with the stockings on Skin: warm and dry, no rash Neuro:  No gross deficits appreciated Psych: euthymic mood, full  affect   EKG:  Not done today  08/23/21 TTE 1. Left ventricular ejection fraction, by estimation, is 70 to 75%. The  left ventricle has hyperdynamic function. The left ventricle has no  regional wall motion abnormalities. There is moderate left ventricular  hypertrophy. Left ventricular diastolic  parameters are indeterminate.   2. Right ventricular systolic function is normal. The right ventricular  size is moderately enlarged. There is mildly elevated pulmonary artery  systolic pressure.   3. Left atrial size was moderately dilated.   4. Right atrial size was severely dilated.   5. The mitral valve is normal in structure. Mild mitral valve  regurgitation.   6. The aortic valve is normal in structure. Aortic valve regurgitation is  not visualized. No aortic stenosis is present.   7. The inferior vena cava is dilated in size with >50% respiratory  variability, suggesting right atrial pressure of 8 mmHg.   Conclusion(s)/Recommendation(s): No significant change from prior echo  03/08/2020. With LVH, biatrial enlargement, septum with speckled appearance  consider ordering a limited echocardiogram with strain to assess for  cardiac amyloid.    03/08/2020; TTE IMPRESSIONS   1. Left ventricular ejection fraction, by estimation, is 60 to 65%. The  left ventricle has normal function. The left ventricle has no regional  wall motion abnormalities. Left ventricular diastolic parameters are  consistent with Grade II diastolic  dysfunction (pseudonormalization). Elevated left ventricular end-diastolic  pressure.   2. Right ventricular systolic function is normal. The right ventricular  size is moderately enlarged. There is moderately elevated pulmonary artery  systolic pressure. The estimated right ventricular systolic pressure is  64.3 mmHg.   3. Left atrial size was moderately dilated.   4. Right atrial size was severely dilated.   5. The mitral valve is normal in structure. Mild mitral  valve  regurgitation. No evidence of mitral stenosis.   6. Tricuspid valve regurgitation is moderate.   7. The aortic valve is tricuspid. Aortic valve regurgitation is not  visualized. Mild aortic valve sclerosis is present, with no evidence of  aortic valve stenosis.   8. The inferior vena cava is normal in size with <50% respiratory  variability, suggesting right atrial pressure of 8 mmHg.   03/08/15: Echocardiogram Study Conclusions - Left  ventricle: The cavity size was normal. Wall thickness was   normal. Systolic function was normal. The estimated ejection   fraction was in the range of 55% to 60%. Wall motion was normal;   there were no regional wall motion abnormalities. - Right ventricle: The cavity size was mildly dilated. - Right atrium: The atrium was mildly dilated. - Atrial septum: No defect or patent foramen ovale was identified. - Tricuspid valve: There was moderate regurgitation. LA 82m  Recent Labs: 08/01/2021: BUN 14; Creatinine, Ser 0.77; Hemoglobin 14.2; Platelets 161; Potassium 4.5; Sodium 143  No results found for requested labs within last 365 days.   CrCl cannot be calculated (Patient's most recent lab result is older than the maximum 21 days allowed.).   Wt Readings from Last 3 Encounters:  03/02/22 132 lb (59.9 kg)  02/06/22 135 lb 4.8 oz (61.4 kg)  01/24/22 138 lb (62.6 kg)     Other studies reviewed: Additional studies/records reviewed today include: summarized above  ASSESSMENT AND PLAN:  1.   Longstanding Persistent >> permanent AFib       CHADS2Vasc is *** 5 on Eliquis, *** appropriately dosed       ***                2. HTN     *** Looks OK  3. Non-pitting edema     *** dependent and 2/2 venous insufficiency  4. Chronic CHF (diastolic) *** ***    Disposition: follow up on labs and echo otherwise see her in [place] if needed.   Current medicines are reviewed at length with the patient today.  The patient did not have any  concerns regarding medicines.  SiHaywood LassoPA-C 05/23/2022 6:28 PM     [place] [phone])  ([phone])

## 2022-05-24 ENCOUNTER — Ambulatory Visit: Payer: Medicare Other | Admitting: Physician Assistant

## 2022-05-24 ENCOUNTER — Encounter: Payer: Self-pay | Admitting: Physician Assistant

## 2022-05-24 VITALS — BP 140/70 | HR 90 | Ht 64.0 in | Wt 142.2 lb

## 2022-05-24 DIAGNOSIS — I4819 Other persistent atrial fibrillation: Secondary | ICD-10-CM | POA: Diagnosis not present

## 2022-05-24 DIAGNOSIS — I10 Essential (primary) hypertension: Secondary | ICD-10-CM | POA: Diagnosis not present

## 2022-05-24 DIAGNOSIS — I5032 Chronic diastolic (congestive) heart failure: Secondary | ICD-10-CM | POA: Diagnosis not present

## 2022-05-24 DIAGNOSIS — I4821 Permanent atrial fibrillation: Secondary | ICD-10-CM

## 2022-05-24 NOTE — Patient Instructions (Addendum)
Medication Instructions:  Your physician recommends that you continue on your current medications as directed. Please refer to the Current Medication list given to you today.   Medication increase:    TAKE YOUR FUROSEMIDE ( LASIX ) 40 Mg daily for the next 3 days.     Labwork: You will have a BMET lab drawn today.    Testing/Procedures: None ordered.  Follow-Up: Your physician wants you to follow-up in: 4 months with Tommye Standard, PA-C, or Dr. Olin Pia.     Any Other Special Instructions Will Be Listed Below (If Applicable).  If you need a refill on your cardiac medications before your next appointment, please call your pharmacy.   Important Information About Sugar

## 2022-05-25 LAB — BASIC METABOLIC PANEL
BUN/Creatinine Ratio: 17 (ref 12–28)
BUN: 12 mg/dL (ref 8–27)
CO2: 24 mmol/L (ref 20–29)
Calcium: 9.3 mg/dL (ref 8.7–10.3)
Chloride: 105 mmol/L (ref 96–106)
Creatinine, Ser: 0.71 mg/dL (ref 0.57–1.00)
Glucose: 73 mg/dL (ref 70–99)
Potassium: 4.6 mmol/L (ref 3.5–5.2)
Sodium: 143 mmol/L (ref 134–144)
eGFR: 82 mL/min/{1.73_m2} (ref >59)

## 2022-06-13 ENCOUNTER — Encounter: Payer: Medicare Other | Admitting: Family Medicine

## 2022-06-20 ENCOUNTER — Encounter: Payer: Self-pay | Admitting: Internal Medicine

## 2022-06-21 ENCOUNTER — Ambulatory Visit (INDEPENDENT_AMBULATORY_CARE_PROVIDER_SITE_OTHER): Payer: Medicare Other | Admitting: Family Medicine

## 2022-06-21 ENCOUNTER — Encounter: Payer: Self-pay | Admitting: Family Medicine

## 2022-06-21 ENCOUNTER — Ambulatory Visit (INDEPENDENT_AMBULATORY_CARE_PROVIDER_SITE_OTHER): Payer: Medicare Other

## 2022-06-21 VITALS — BP 142/82 | HR 94 | Temp 97.6°F | Ht 64.0 in | Wt 143.0 lb

## 2022-06-21 DIAGNOSIS — S61011A Laceration without foreign body of right thumb without damage to nail, initial encounter: Secondary | ICD-10-CM

## 2022-06-21 DIAGNOSIS — S6991XA Unspecified injury of right wrist, hand and finger(s), initial encounter: Secondary | ICD-10-CM | POA: Insufficient documentation

## 2022-06-21 DIAGNOSIS — S5011XA Contusion of right forearm, initial encounter: Secondary | ICD-10-CM

## 2022-06-21 DIAGNOSIS — W19XXXA Unspecified fall, initial encounter: Secondary | ICD-10-CM

## 2022-06-21 DIAGNOSIS — S60221A Contusion of right hand, initial encounter: Secondary | ICD-10-CM | POA: Diagnosis not present

## 2022-06-21 DIAGNOSIS — R109 Unspecified abdominal pain: Secondary | ICD-10-CM | POA: Diagnosis not present

## 2022-06-21 DIAGNOSIS — Z23 Encounter for immunization: Secondary | ICD-10-CM

## 2022-06-21 DIAGNOSIS — S60211A Contusion of right wrist, initial encounter: Secondary | ICD-10-CM | POA: Diagnosis not present

## 2022-06-21 NOTE — Assessment & Plan Note (Signed)
Mechanical fall on wooden deck 8 days ago. Steady gait today.

## 2022-06-21 NOTE — Assessment & Plan Note (Signed)
Healing, no sign of infection. Tdap updated due to injury. Continue with antibiotic ointment and band aid for an additional few days.

## 2022-06-21 NOTE — Assessment & Plan Note (Signed)
Muscular pain with movement. Non tender ribs and abdomen. Tylenol and heating pad. Should heal with time.

## 2022-06-21 NOTE — Assessment & Plan Note (Signed)
Right hand is neurovascularly intact. XR ordered, continue to ice and take Tylenol prn. Follow up pending XR results.

## 2022-06-21 NOTE — Assessment & Plan Note (Signed)
No obvious deformity.  XR ordered, continue to ice and take Tylenol prn. Follow up pending XR results.

## 2022-06-21 NOTE — Progress Notes (Signed)
Subjective:     Patient ID: Molly Tucker, female    DOB: [birth year], 88 y.o.   MRN: [id number]  Chief Complaint  Patient presents with   Hand Injury    Had a fall on 8/15 and tried to catch herself and hurt her right hand. Hand has bruising and some swelling as well as some bruising on her right arm.   Left sided pain when she moves a certain way.     Hand Injury    Patient is in today for mechanical fall on 06/13/2022. States she tripped on the wooden deck of a friends house. States she injured her right thumb, wrist and forearm. Bruising and swelling as well.  Denies hitting her head. No neck or back pain.   She is on Eliquis.   She also reports left side pain with certain movements that started 2 days after her fall. She denies hitting her side or ribs.   Taking Tylenol and using ice for pain.     Health Maintenance Due  Topic Date Due   Zoster Vaccines- Shingrix (1 of 2) Never done   Pneumonia Vaccine 30+ Years old (2 - PPSV23 or PCV20) 08/03/2020    Past Medical History:  Diagnosis Date   Arthritis    Atrial tachycardia ([place])    Breast cancer ([place]) 11/30/2020   Carotid bruit 2005   right, carotid doppler: <39% occlusion   Complication of anesthesia    Dysrhythmia    Chronic A. Fib   Family history of breast cancer    Family history of GI tract cancer    Family history of stomach cancer    Hyperlipidemia    LDL goal =<120   Hyperplastic colonic polyp 2006   Dr. Carlean Purl, due 2016   Hypertension    Osteoporosis    S/P  biphosphonates x 5 yrs   Persistent atrial fibrillation (HCC)    PONV (postoperative nausea and vomiting)     Past Surgical History:  Procedure Laterality Date   BREAST LUMPECTOMY     BREAST LUMPECTOMY WITH SENTINEL LYMPH NODE BIOPSY Left 12/23/2020   Procedure: LEFT BREAST LUMPECTOMY WITH SENTINEL LYMPH NODE BX;  Surgeon: Stark Klein, MD;  Location: [place];  Service: General;  Laterality: Left;  RNFA, PEC BLOCK; START TIME OF 1030  rm 2 per ivey   CARDIOVERSION N/A 03/31/2015   Procedure: CARDIOVERSION;  Surgeon: Sanda Klein, MD;  Location: [place] ENDOSCOPY;  Service: Cardiovascular;  Laterality: N/A;   CARDIOVERSION N/A 12/21/2015   Procedure: CARDIOVERSION;  Surgeon: Lelon Perla, MD;  Location: [facility] ENDOSCOPY;  Service: Cardiovascular;  Laterality: N/A;   CARDIOVERSION N/A 01/09/2017   Procedure: CARDIOVERSION;  Surgeon: Jerline Pain, MD;  Location: [place];  Service: Cardiovascular;  Laterality: N/A;   CARDIOVERSION N/A 07/09/2019   Procedure: CARDIOVERSION;  Surgeon: Sanda Klein, MD;  Location: [place] ENDOSCOPY;  Service: Cardiovascular;  Laterality: N/A;   CARDIOVERSION N/A 01/14/2020   Procedure: CARDIOVERSION;  Surgeon: Geralynn Rile, MD;  Location: [place];  Service: Cardiovascular;  Laterality: N/A;   CARDIOVERSION N/A 02/23/2020   Procedure: CARDIOVERSION;  Surgeon: Jerline Pain, MD;  Location: [facility] ENDOSCOPY;  Service: Cardiovascular;  Laterality: N/A;   COLONOSCOPY W/ POLYPECTOMY  10/30/2004   DILATION AND CURETTAGE OF UTERUS     MOUTH SURGERY  2009-2010   implants, Dr. Marcelyn Ditty    Family History  Problem Relation Age of Onset   Coronary artery disease Father  MI in 80s   Coronary artery disease Sister        stent 2009   Breast cancer Sister 45       bilateral, diagnosed in other breast at age 86   Cancer Paternal Aunt        colon, possible breast cancer, dx 50s/60s   Arthritis Other        aunts   Stomach cancer Maternal Uncle        dx 41s   Intellectual disability Paternal Uncle    Cancer Paternal Aunt        gastrointestinal, dx 50s/60s   Stroke Neg Hx     Social History   Socioeconomic History   Marital status: Married    Spouse name: Not on file   Number of children: Not on file   Years of education: Not on file   Highest education level: Not on file  Occupational History   Occupation: Retired    Fish farm manager: [employer]  Tobacco Use   Smoking status:  Former    Types: Cigarettes    Quit date: 10/30/1976    Years since quitting: 45.6   Smokeless tobacco: Never   Tobacco comments:    Patient would ONLY smoke occasionally   Vaping Use   Vaping Use: Never used  Substance and Sexual Activity   Alcohol use: No   Drug use: No   Sexual activity: Not on file  Other Topics Concern   Not on file  Social History Narrative   Regular exercise: yes: walking 30 minute once daily   Social Determinants of Health   Financial Resource Strain: Low Risk  (08/26/2020)   Overall Financial Resource Strain (CARDIA)    Difficulty of Paying Living Expenses: Not hard at all  Food Insecurity: No Food Insecurity (09/10/2020)   Hunger Vital Sign    Worried About Running Out of Food in the Last Year: Never true    [place] in the Last Year: Never true  Transportation Needs: No Transportation Needs (09/10/2020)   PRAPARE - Hydrologist (Medical): No    Lack of Transportation (Non-Medical): No  Physical Activity: Sufficiently Active (09/10/2020)   Exercise Vital Sign    Days of Exercise per Week: 5 days    Minutes of Exercise per Session: 30 min  Stress: No Stress Concern Present (09/10/2020)   [place]    Feeling of Stress : Not at all  Social Connections: [place] (09/10/2020)   Social Connection and Isolation Panel [NHANES]    Frequency of Communication with Friends and Family: More than three times a week    Frequency of Social Gatherings with Friends and Family: More than three times a week    Attends Religious Services: More than 4 times per year    Active Member of [employer] or Organizations: Yes    Attends Music therapist: More than 4 times per year    Marital Status: Married  Human resources officer Violence: Not on file    Outpatient Medications Prior to Visit  Medication Sig Dispense Refill   acetaminophen (TYLENOL) 500 MG  tablet Take 500 mg by mouth every 6 (six) hours as needed for moderate pain.     anastrozole (ARIMIDEX) 1 MG tablet Take 1 tablet (1 mg total) by mouth daily. 90 tablet 3   Cholecalciferol (VITAMIN D) 50 MCG (2000 UT) tablet Take 2,000 Units by mouth every morning.  clonazePAM (KLONOPIN) 0.5 MG tablet Take 0.5-1 tablets (0.25-0.5 mg total) by mouth at bedtime as needed for anxiety. 30 tablet 3   ELIQUIS 5 MG TABS tablet TAKE 1 TABLET BY MOUTH TWICE  DAILY 180 tablet 1   furosemide (LASIX) 20 MG tablet Take one tablet by mouth ( 20 mg) daily 90 tablet 2   metoprolol succinate (TOPROL XL) 25 MG 24 hr tablet Take 1 tablet (25 mg total) by mouth daily. 90 tablet 3   No facility-administered medications prior to visit.    No Known Allergies  ROS     Objective:    Physical Exam Constitutional:      General: She is not in acute distress.    Appearance: She is not ill-appearing.  HENT:     Head: Atraumatic.     Mouth/Throat:     Mouth: Mucous membranes are moist.  Eyes:     Conjunctiva/sclera: Conjunctivae normal.     Pupils: Pupils are equal, round, and reactive to light.  Cardiovascular:     Rate and Rhythm: Normal rate.  Pulmonary:     Effort: Pulmonary effort is normal.     Breath sounds: Normal breath sounds.  Abdominal:     General: There is no distension.     Palpations: Abdomen is soft.     Tenderness: There is no abdominal tenderness. There is no guarding.  Musculoskeletal:     Right elbow: Normal.     Right forearm: Swelling and tenderness present.     Right wrist: Swelling and tenderness present. Normal pulse.     Right hand: Swelling and tenderness present. Decreased range of motion. Normal strength. Normal sensation. Normal capillary refill. Normal pulse.     Cervical back: Normal, normal range of motion and neck supple.     Thoracic back: Normal.     Lumbar back: Normal.     Comments: Right forearm with significant anterior bruising and TTP to radial forearm   Right wrist with bruising, tenderness to radial aspect Right thumb with hematoma, superficial laceration, healing, no sign of infection, limited ROM of thumb and TTP to MCP joint  Skin:    General: Skin is warm and dry.     Capillary Refill: Capillary refill takes less than 2 seconds.     Findings: Bruising present.  Neurological:     General: No focal deficit present.     Mental Status: She is alert and oriented to person, place, and time.     Cranial Nerves: No cranial nerve deficit.     Motor: No weakness.     Gait: Gait normal.  Psychiatric:        Mood and Affect: Mood normal.        Behavior: Behavior normal.        Thought Content: Thought content normal.     BP (!) 142/82 (BP Location: Left Arm, Patient Position: Sitting, Cuff Size: Large)   Pulse 94   Temp 97.6 F (36.4 C) (Temporal)   Ht '5\' 4"'$  (1.626 m)   Wt 143 lb (64.9 kg)   SpO2 98%   BMI 24.55 kg/m  Wt Readings from Last 3 Encounters:  06/21/22 143 lb (64.9 kg)  05/24/22 142 lb 3.2 oz (64.5 kg)  03/02/22 132 lb (59.9 kg)       Assessment & Plan:   Problem List Items Addressed This Visit       Musculoskeletal and Integument   Laceration of skin of right thumb    Healing,  no sign of infection. Tdap updated due to injury. Continue with antibiotic ointment and band aid for an additional few days.       Relevant Orders   Tdap vaccine greater than or equal to 7yo IM (Completed)     Other   Contusion of right forearm    No obvious deformity.  XR ordered, continue to ice and take Tylenol prn. Follow up pending XR results.      Relevant Orders   DG Forearm Right   Fall    Mechanical fall on wooden deck 8 days ago. Steady gait today.       Relevant Orders   DG Hand Complete Right   DG Wrist Complete Right   DG Forearm Right   Injury of right hand - Primary    Right hand is neurovascularly intact. XR ordered, continue to ice and take Tylenol prn. Follow up pending XR results.       Relevant  Orders   Tdap vaccine greater than or equal to 7yo IM (Completed)   DG Hand Complete Right   Injury of right wrist    No obvious deformity.  XR ordered, continue to ice and take Tylenol prn. Follow up pending XR results.      Relevant Orders   DG Wrist Complete Right   Side pain    Muscular pain with movement. Non tender ribs and abdomen. Tylenol and heating pad. Should heal with time.        I am having Molly Tucker maintain her acetaminophen, metoprolol succinate, furosemide, anastrozole, clonazePAM, Eliquis, and Vitamin D.  No orders of the defined types were placed in this encounter.

## 2022-06-21 NOTE — Patient Instructions (Signed)
Please go downstairs for x-rays of your right hand, wrist and forearm.  Continue using ice and taking Tylenol as needed for pain.  We will be in touch with your results.  Follow-up if you are not improving significantly or if you notice any new or worsening symptoms.

## 2022-06-25 ENCOUNTER — Other Ambulatory Visit: Payer: Self-pay | Admitting: Internal Medicine

## 2022-06-26 NOTE — Telephone Encounter (Signed)
Prescription refill request for Eliquis received. Indication:Afib Last office visit:7/23 Scr:0.7 Age: 88 Weight:64.9 kg  Prescription refilled

## 2022-06-27 ENCOUNTER — Ambulatory Visit (INDEPENDENT_AMBULATORY_CARE_PROVIDER_SITE_OTHER): Payer: Medicare Other | Admitting: Family Medicine

## 2022-06-27 VITALS — BP 150/82 | HR 91 | Ht 64.0 in | Wt 140.0 lb

## 2022-06-27 DIAGNOSIS — Z0000 Encounter for general adult medical examination without abnormal findings: Secondary | ICD-10-CM

## 2022-06-27 NOTE — Progress Notes (Signed)
MEDICARE WELLNESS VISIT PATIENT CHECK-IN and HEALTH RISK ASSESSMENT QUESTIONNAIRE:  -completed by phone/video for upcoming Medicare Preventive Visit  Pre-Visit Check-in: 1)Vitals (height, wt, BP, etc) - record in vitals section for visit on day of visit 2)Review and Update Medications, Allergies PMH, Surgeries, Social history in Epic 3)Hospitalizations in the last year with date/reason? NO  4)Review and Update Care Team (patient's specialists) in Epic 5) Complete PHQ9 in Epic  6) Complete Fall Screening in Epic 7)Review all Health Maintenance Due and order under PCP if not done.  8)Medicare Wellness Questionnaire: Answer theses question about your habits: Do you drink alcohol? NO How many drinks do you have a day?N/A Have you ever smoked?N/A Have you stopped smoking and date if applicable? N/A  How many packs a day do you smoke? N/A Do you use smokeless tobacco?N/A Do you use illicit drugs?N/A Do you exercises? YES, WALK A DOG If so, what type and how many days/minutes per week? 3O MINS A DAY.  Are you sexually active? NO  What did you eat for breakfast today (or yesterday)?CEREAL  Typical breakfast CEREAL, OATMEAL, BANANA, SOMETIMES TOAST What did you eat for lunch today (or yesterday)?  Typical lunch SANDWICH/SALAD What did you eat for diner today (or yesterday)?GRILL CHEESE SANDWICH Typical dinner MEAT, SALAD- A LOT OF TAKE OUT FOOD Typical snacks:CHEESE CRACKERS, CHIPS, ICE CREAM CONE, GRAPES, BANANA, MELONS What beverages do you drink besides water:mostly drinks water, DIET COKE SOMETIMES  Answer theses question about you: Can you perform most household chores?YES BUT TAKES A LITTLE LONGER Do you find it hard to follow a conversation in a noisy room?NO Do you find it hard to understand a speaker at church or in a meeting?NO Do you often ask people to speak up or repeat themselves?NO Do you experience ringing in your ears?NO Do you have difficulty understanding a soft or  whispered voice?NO Do you feel that you have a problem with memory?NO Do you often misplace items?NO Do you balance your checkbook and or bank acounts?YES Do you feel safe at home?YES, SON LIVE PATIENT Last dentist visit? MAY 4 OF 2023 Do you need assistance with any of the following: NO ASSISTANCE NEEDED TO ALL  Driving?  Feeding yourself?  Getting from bed to chair?  Getting to the toilet?  Bathing or showering?  Dressing yourself?  Managing money?  Climbing a flight of stairs?  Preparing meals?  Do you have Advanced Directives in place (Living Will, Healthcare Power or Attorney)? YES TO BOTH   Last eye Exam and location?OVER A YEAR AGO AT [facility]   Do you currently use prescribed or non-prescribed narcotic or opioid pain medications?NO  Do you have a history or close family history of breast, ovarian, tubal or peritoneal cancer or a family member with BRCA 1/2 (breast cancer susceptibility 1 and 2) gene mutations? SISTER HAD BREAST CANCER but does not believe was genetic  Nurse/Assistant Credentials/time stamp:Eula Mazzola M./CMA/2:59PM      MEDICARE ANNUAL PREVENTIVE VISIT WITH PROVIDER: (Welcome to Medicare, initial annual wellness or annual wellness exam)  Virtual Visit via Video Note  I connected with Molly Tucker  on 06/27/22 by a video enabled telemedicine application and verified that I am speaking with the correct person using two identifiers.  Location patient: home Location provider:work or home office Persons participating in the virtual visit: patient, provider  Concerns and/or follow up today:  She takes diuretic from her cardiologist for swelling and also reports has varicose veins. Compression socks really help, but she  has quite a bit of trouble getting them on. She started vit D a few months ago.   See HM section in Epic for other details of completed HM.    ROS: negative for report of fevers, unintentional weight loss, vision changes,  vision loss, hearing loss or change, chest pain, sob, hemoptysis, melena, hematochezia, hematuria, genital discharge or lesions, falls, bleeding or bruising, loc, thoughts of suicide or self harm, memory loss  Patient-completed extensive health risk assessment - reviewed and discussed with the patient: See Health Risk Assessment completed with patient prior to the visit either above or in recent phone note. This was reviewed in detailed with the patient today and appropriate recommendations, orders and referrals were placed as needed per Summary below and patient instructions.   Review of Medical History: -PMH, PSH, Family History and current specialty and care providers reviewed and updated and listed below   Patient Care Team: Pincus Sanes, MD as PCP - General (Internal Medicine) Duke Salvia, MD as PCP - Cardiology (Cardiology) Kathyrn Sheriff, [facility] as Pharmacist (Pharmacist) Manning Charity, OD as Referring Physician (Optometry) Antony Contras, MD as Consulting Physician (Ophthalmology) Pershing Proud, RN as Oncology Nurse Navigator Donnelly Angelica, RN as Oncology Nurse Navigator   Past Medical History:  Diagnosis Date   Arthritis    Atrial tachycardia [facility])    Breast cancer (HCC) 11/30/2020   Carotid bruit 2005   right, carotid doppler: <39% occlusion   Complication of anesthesia    Dysrhythmia    Chronic A. Fib   Family history of breast cancer    Family history of GI tract cancer    Family history of stomach cancer    Hyperlipidemia    LDL goal =<120   Hyperplastic colonic polyp 2006   Dr. Leone Payor, due 2016   Hypertension    Osteoporosis    S/P  biphosphonates x 5 yrs   Persistent atrial fibrillation (HCC)    PONV (postoperative nausea and vomiting)     Past Surgical History:  Procedure Laterality Date   BREAST LUMPECTOMY     BREAST LUMPECTOMY WITH SENTINEL LYMPH NODE BIOPSY Left 12/23/2020   Procedure: LEFT BREAST LUMPECTOMY WITH SENTINEL LYMPH NODE BX;   Surgeon: Almond Lint, MD;  Location: [facility] OR;  Service: General;  Laterality: Left;  RNFA, PEC BLOCK; START TIME OF 1030 rm 2 per ivey   CARDIOVERSION N/A 03/31/2015   Procedure: CARDIOVERSION;  Surgeon: Thurmon Fair, MD;  Location: [facility] ENDOSCOPY;  Service: Cardiovascular;  Laterality: N/A;   CARDIOVERSION N/A 12/21/2015   Procedure: CARDIOVERSION;  Surgeon: Lewayne Bunting, MD;  Location[facility] ENDOSCOPY;  Service: Cardiovascular;  Laterality: N/A;   CARDIOVERSION N/A 01/09/2017   Procedure: CARDIOVERSION;  Surgeon: Jake Bathe, MD;  Location: [facility] ENDOSCOPY;  Service: Cardiovascular;  Laterality: N/A;   CARDIOVERSION N/A 07/09/2019   Procedure: CARDIOVERSION;  Surgeon: Thurmon Fair, MD;  Location: [facility] ENDOSCOPY;  Service: Cardiovascular;  Laterality: N/A;   CARDIOVERSION N/A 01/14/2020   Procedure: CARDIOVERSION;  Surgeon: Sande Rives, MD;  Location: [facility] ENDOSCOPY;  Service: Cardiovascular;  Laterality: N/A;   CARDIOVERSION N/A 02/23/2020   Procedure: CARDIOVERSION;  Surgeon: Jake Bathe, MD;  Location: [facility] ENDOSCOPY;  Service: Cardiovascular;  Laterality: N/A;   COLONOSCOPY W/ POLYPECTOMY  10/30/2004   DILATION AND CURETTAGE OF UTERUS     MOUTH SURGERY  2009-2010   implants, Dr. Dereck Leep    Social History   Socioeconomic History   Marital status: Widowed  Spouse name: Not on file   Number of children: Not on file   Years of education: Not on file   Highest education level: Not on file  Occupational History   Occupation: Retired    Fish farm manager: [employer]  Tobacco Use   Smoking status: Former    Types: Cigarettes    Quit date: 10/30/1976    Years since quitting: 45.6   Smokeless tobacco: Never   Tobacco comments:    Patient would ONLY smoke occasionally   Vaping Use   Vaping Use: Never used  Substance and Sexual Activity   Alcohol use: No   Drug use: No   Sexual activity: Not on file  Other Topics Concern   Not on file  Social History Narrative   Regular  exercise: yes: walking 30 minute once daily   Social Determinants of Health   Financial Resource Strain: Low Risk  (08/26/2020)   Overall Financial Resource Strain (CARDIA)    Difficulty of Paying Living Expenses: Not hard at all  Food Insecurity: No Food Insecurity (09/10/2020)   Hunger Vital Sign    Worried About Running Out of Food in the Last Year: Never true    [place] in the Last Year: Never true  Transportation Needs: No Transportation Needs (09/10/2020)   PRAPARE - Hydrologist (Medical): No    Lack of Transportation (Non-Medical): No  Physical Activity: Sufficiently Active (09/10/2020)   Exercise Vital Sign    Days of Exercise per Week: 5 days    Minutes of Exercise per Session: 30 min  Stress: No Stress Concern Present (09/10/2020)   [place]    Feeling of Stress : Not at all  Social Connections: [place] (09/10/2020)   Social Connection and Isolation Panel [NHANES]    Frequency of Communication with Friends and Family: More than three times a week    Frequency of Social Gatherings with Friends and Family: More than three times a week    Attends Religious Services: More than 4 times per year    Active Member of [employer] or Organizations: Yes    Attends Music therapist: More than 4 times per year    Marital Status: Married  Human resources officer Violence: Not on file    Family History  Problem Relation Age of Onset   Coronary artery disease Father        MI in 74s   Coronary artery disease Sister        stent 2009   Breast cancer Sister 70       bilateral, diagnosed in other breast at age 53   Cancer Paternal Aunt        colon, possible breast cancer, dx 50s/60s   Arthritis Other        aunts   Stomach cancer Maternal Uncle        dx 23s   Intellectual disability Paternal Uncle    Cancer Paternal Aunt        gastrointestinal, dx 50s/60s    Stroke Neg Hx     Current Outpatient Medications on File Prior to Visit  Medication Sig Dispense Refill   acetaminophen (TYLENOL) 500 MG tablet Take 500 mg by mouth every 6 (six) hours as needed for moderate pain.     anastrozole (ARIMIDEX) 1 MG tablet Take 1 tablet (1 mg total) by mouth daily. 90 tablet 3   Cholecalciferol (VITAMIN D) 50 MCG (2000 UT)  tablet Take 2,000 Units by mouth every morning.     clonazePAM (KLONOPIN) 0.5 MG tablet Take 0.5-1 tablets (0.25-0.5 mg total) by mouth at bedtime as needed for anxiety. 30 tablet 3   ELIQUIS 5 MG TABS tablet TAKE 1 TABLET BY MOUTH TWICE  DAILY 200 tablet 2   furosemide (LASIX) 20 MG tablet Take one tablet by mouth ( 20 mg) daily 90 tablet 2   metoprolol succinate (TOPROL XL) 25 MG 24 hr tablet Take 1 tablet (25 mg total) by mouth daily. 90 tablet 3   No current facility-administered medications on file prior to visit.    No Known Allergies     Physical Exam Vitals:   06/27/22 1441  BP: (!) 150/82  Pulse: 91   Estimated body mass index is 24.03 kg/m as calculated from the following:   Height as of this encounter: $RemoveBeforeD'5\' 4"'[id number]$  (1.626 m).   Weight as of this encounter: 140 lb (63.5 kg).  EKG (optional): deferred due to virtual visit  GENERAL: sounds alert, energetic and in no distress on the phone.   PSYCH/NEURO: pleasant and cooperative,speech and thought processing grossly intact,  Cognitive function grossly intact  [place] Visit from 06/27/2022 in [place] at [place]  PHQ-9 Total Score 1           06/27/2022    2:44 PM 03/02/2022   10:09 AM 09/10/2020    3:54 PM 09/10/2020    3:01 PM 09/09/2019    2:17 PM  Depression screen PHQ 2/9  Decreased Interest 0 0 0 0 0  Down, Depressed, Hopeless 0 1 0 0 0  PHQ - 2 Score 0 1 0 0 0  Altered sleeping 0 2     Tired, decreased energy 1 1     Change in appetite 0 0     Feeling bad or failure about yourself  0 0     Trouble concentrating 0 0     Moving slowly  or fidgety/restless 0 0     Suicidal thoughts 0 0     PHQ-9 Score 1 4     Difficult doing work/chores  Not difficult at all         12/23/2020    9:18 AM 01/05/2021    4:13 PM 03/02/2022   10:09 AM 06/23/2022   11:51 AM 06/27/2022    2:43 PM  [place] in the past year?   0 1 1  Was there an injury with Fall?   0 1 1  Was there an injury with Fall? - Comments     hand, thumb. got treated. no fracture  Fall Risk Category Calculator   0 2 2  Fall Risk Category   Low Moderate Moderate  Patient Fall Risk Level Low fall risk High fall risk Low fall risk  Moderate fall risk  Patient at Risk for Falls Due to   No Fall Risks  Impaired balance/gait  Fall risk Follow up   Falls evaluation completed  Falls evaluation completed     SUMMARY AND PLAN:  Medicare annual wellness visit, subsequent   The following health maintenance/preventive care measures were recommended/discussed and the patient was referred if needed and if the patient agreeable:   Vaccines: discussed    Mammogram: utd  Colorectal cancer screening: n/a  Osteoporosis screening if applicable: she reports she has had dexa in the past and was on fosomax and tx for osteoporosis, but she opted to terminate treatment and testing and  does not wish to do further. She reports she has started taking 2000 IU vit D3 daily.   Screening for glaucoma - see optho   Education and counseling on the following was provided based on the above review of health and a plan/checklist for the patient, along with additional information discussed, was provided for the patient in the patient instructions :   -Provided counseling and plan for increased risk of falling. She feels she ambulate well and is walking for exercise which is good. Did discuss using precautions, getting balance before starting, etc.  -Advised and counseled on maintaining healthy weight and healthy lifestyle - including the importance of a health diet, regular physical  activity, social connections and stress management. -Advised and counseled on a whole foods based healthy diet and regular exercise: discussed a heart healthy whole foods based diet at length. Advised lower sodium and sugar in diet given her specific health issues. A summary of a healthy diet was provided in the Patient Instructions.Offered referral to dietician/weight management clinic if applicable and follow up virtual visits to assist further and monitor progress. Recommended regular exercise and discussed options within the community.  -she is having issues putting on her compression sock. Discussed compression stocking donners and she was very grateful for the suggestion and found some on amaz[employer] to try. Discusses how to use.  -Advise yearly dental visits at minimum and regular eye exams   Follow up: see patient instructions     There are no Patient Instructions on file for this visit.  Lucretia Kern, DO

## 2022-06-27 NOTE — Patient Instructions (Signed)
CHECKLIST FOR A HEALTHY LIFE:  -Eat a healthy whole foods based diet, get regular physical activity, manage stress and engage in social connections - see below for specific suggestions and more information.  -Vaccines due:  -annual flu shot  -covid booster - new one coming out next month  -shingles vaccine  -Tests and screenings due:  -consider checking Vit D level with your doctor   -See a dentist at least yearly  -Get your eyes checked and then per your eye specialist's recommendations  -Other issues addressed today: -I hope the compression stocking donner works out! -try to cut back on sweetened and   -Follow up: - inperson follow up in the next few months with your primary care doctor -annual wellness visit yearly     [place]: Food Prescription for you: -eat real food: lots of colorful vegetables (half the plate), small amounts of fresh fruits, fish, nuts, seeds, healthy oils (such as olive oil, avocado oil or organic grass fed unsalted butter), small portions of meat and small portions of whole grains -drink water -try to avoid fast food and pre-packaged foods  -try to avoid foods that contain any ingredients with names you do not recognize  -try to avoid sugar/sweets (except for the natural sugar that occurs in fresh fruit) -try to avoid sweet drinks -try to avoid white rice, white bread, pasta, white or yellow potatoes  MOVE - the key to keeping your body moving and working best: Exercise Prescription for you: -gradually increase intentional physical activity -move and stretch your body, legs, feet and arms when sitting for long periods -try to get at least 20 minutes of sustained activity  or two 10 minute episodes of sustained activity every day at minimum  [place] - so important for health and well being -try meditating, or just sitting quietly with deep breathing while intentionally relaxing all parts of your body for 5 minutes daily  SOCIAL CONNECTIONS: -options in [place] if you wish to engage in more social activities: -Check out the [place] 50+ section on the [place] [employer] (hiking clubs, book clubs, cards and games, chess, exercise classes, aquatic classes and much more) - see the website for details: https://www.[place]-.gov/departments/parks-recreation/active-adults50 -[place] (a variety of indoor and outdoor inperson activities for adults). [phone]. [address]. -Virtual Online Classes (a variety of topics): see seniorplanet.org or call 6162388633 -consider volunteering at a school, hospice center, church, senior center or elsewhere

## 2022-07-02 NOTE — Progress Notes (Signed)
Medical screening examination/treatment/procedure(s) were performed by non-physician practitioner and as supervising physician I was immediately available for consultation/collaboration.  I agree with above. Hammad Finkler J Jerid Catherman, MD  

## 2022-08-07 ENCOUNTER — Ambulatory Visit: Payer: Medicare Other | Admitting: Hematology and Oncology

## 2022-08-15 DIAGNOSIS — H26492 Other secondary cataract, left eye: Secondary | ICD-10-CM | POA: Diagnosis not present

## 2022-08-15 DIAGNOSIS — H353131 Nonexudative age-related macular degeneration, bilateral, early dry stage: Secondary | ICD-10-CM | POA: Diagnosis not present

## 2022-08-15 DIAGNOSIS — H5203 Hypermetropia, bilateral: Secondary | ICD-10-CM | POA: Diagnosis not present

## 2022-08-15 DIAGNOSIS — H52203 Unspecified astigmatism, bilateral: Secondary | ICD-10-CM | POA: Diagnosis not present

## 2022-08-16 ENCOUNTER — Encounter: Payer: Self-pay | Admitting: Internal Medicine

## 2022-08-17 ENCOUNTER — Inpatient Hospital Stay: Payer: Medicare Other | Attending: Hematology and Oncology | Admitting: Hematology and Oncology

## 2022-08-17 VITALS — BP 149/71 | HR 92 | Temp 97.2°F | Resp 18 | Wt 146.5 lb

## 2022-08-17 DIAGNOSIS — Z170 Estrogen receptor positive status [ER+]: Secondary | ICD-10-CM | POA: Insufficient documentation

## 2022-08-17 DIAGNOSIS — Z79811 Long term (current) use of aromatase inhibitors: Secondary | ICD-10-CM | POA: Insufficient documentation

## 2022-08-17 DIAGNOSIS — Z87891 Personal history of nicotine dependence: Secondary | ICD-10-CM | POA: Diagnosis not present

## 2022-08-17 DIAGNOSIS — C50312 Malignant neoplasm of lower-inner quadrant of left female breast: Secondary | ICD-10-CM | POA: Insufficient documentation

## 2022-08-17 NOTE — Progress Notes (Signed)
Molly Molly Tucker  [phone] [phone]     ID: Molly Molly Tucker DOB: 1931-12-05  MR#: [id number]  CSN#:[id number]  Patient Care Team: Molly Rail, MD as PCP - General (Internal Medicine) Molly Sprang, MD as PCP - Cardiology (Cardiology) Molly Molly Tucker, [facility] as Pharmacist (Pharmacist) Molly Molly Tucker, [place] as Referring Physician (Optometry) Molly Apo, MD as Consulting Physician (Ophthalmology) Molly Kaufmann, RN as Oncology Nurse Navigator Molly Germany, RN as Oncology Nurse Navigator Molly Pike, MD   CHIEF COMPLAINT: estrogen receptor positive breast cancer  CURRENT TREATMENT: Anastrozole   INTERVAL HISTORY:  She is here for follow up on anastrazole. She denies any complaints. She is tolerating it very well. No changes in breast reported on self exam Rest of the pertinent 10 point ROS reviewed and negative.   COVID 19 VACCINATION STATUS: [place] x2, most recently 12/2019   HISTORY OF CURRENT ILLNESS: From the original intake note:  Molly Molly Tucker herself palpated a retroareolar left breast mass. She underwent bilateral diagnostic mammography with tomography and left breast ultrasonography at [facility] on 11/02/2020 showing: breast density category B; palpable 2.3 cm indeterminate mass in retroareolar left breast at 8 o'clock, involving skin and underlying breast; normal-appearing left axillary lymph nodes.  Accordingly on 11/30/2020 she proceeded to biopsy of the left breast area in question. The pathology from this procedure (Molly Molly Tucker) showed: invasive ductal carcinoma, grade 1-2, with extracellular mucin; ductal carcinoma in situ. Prognostic indicators significant for: estrogen receptor, 100% positive and progesterone receptor, 100% positive, both with strong staining intensity. Proliferation marker Ki67 at 5%. HER2 negative by immunohistochemistry (1+).  She proceeded to left lumpectomy on 12/23/2020 under Dr. Barry Molly Tucker.  Pathology from the procedure (Molly Molly Tucker) showing: invasive ductal carcinoma with extracellular mucin, 2.7 cm, grade 1; ductal carcinoma in situ; margins not involved.  All three biopsied lymph nodes were negative for metastatic carcinoma (0/3).   Cancer Staging  Malignant neoplasm of lower-inner quadrant of left breast in female, estrogen receptor positive ([place]) Staging form: Breast, AJCC 8th Edition - Clinical stage from 11/30/2020: Stage IB (cT2, cN0, cM0, G2, ER+, PR+, HER2-) - Signed by Molly Cruel, MD on 01/05/2021 Stage prefix: Initial diagnosis  The patient's subsequent history is as detailed below.   PAST MEDICAL HISTORY: Past Medical History:  Diagnosis Date   Arthritis    Atrial tachycardia ([place])    Breast cancer ([place]) 11/30/2020   Carotid bruit 2005   right, carotid doppler: <39% occlusion   Complication of anesthesia    Dysrhythmia    Chronic A. Fib   Family history of breast cancer    Family history of GI tract cancer    Family history of stomach cancer    Hyperlipidemia    LDL goal =<120   Hyperplastic colonic polyp 2006   Dr. Carlean Tucker, due 2016   Hypertension    Osteoporosis    S/P  biphosphonates x 5 yrs   Persistent atrial fibrillation (HCC)    PONV (postoperative nausea and vomiting)     PAST SURGICAL HISTORY: Past Surgical History:  Procedure Laterality Date   BREAST LUMPECTOMY     BREAST LUMPECTOMY WITH SENTINEL LYMPH NODE BIOPSY Left 12/23/2020   Procedure: LEFT BREAST LUMPECTOMY WITH SENTINEL LYMPH NODE BX;  Surgeon: Molly Klein, MD;  Location: [place];  Service: General;  Laterality: Left;  RNFA, PEC BLOCK; START TIME OF 1030 rm 2 per ivey   CARDIOVERSION N/A 03/31/2015   Procedure: CARDIOVERSION;  Surgeon: Molly Gobble Croitoru,  MD;  Location: [place];  Service: Cardiovascular;  Laterality: N/A;   CARDIOVERSION N/A 12/21/2015   Procedure: CARDIOVERSION;  Surgeon: Molly Perla, MD;  Location: [place];  Service: Cardiovascular;   Laterality: N/A;   CARDIOVERSION N/A 01/09/2017   Procedure: CARDIOVERSION;  Surgeon: Molly Pain, MD;  Location: [place];  Service: Cardiovascular;  Laterality: N/A;   CARDIOVERSION N/A 07/09/2019   Procedure: CARDIOVERSION;  Surgeon: Molly Klein, MD;  Location: [place] ENDOSCOPY;  Service: Cardiovascular;  Laterality: N/A;   CARDIOVERSION N/A 01/14/2020   Procedure: CARDIOVERSION;  Surgeon: Molly Rile, MD;  Location: [place];  Service: Cardiovascular;  Laterality: N/A;   CARDIOVERSION N/A 02/23/2020   Procedure: CARDIOVERSION;  Surgeon: Molly Pain, MD;  Location: [facility] ENDOSCOPY;  Service: Cardiovascular;  Laterality: N/A;   COLONOSCOPY W/ POLYPECTOMY  10/30/2004   DILATION AND CURETTAGE OF UTERUS     MOUTH SURGERY  2009-2010   implants, Dr. Marcelyn Tucker    FAMILY HISTORY: Family History  Problem Relation Age of Onset   Coronary artery disease Father        MI in 68s   Coronary artery disease Sister        stent 2009   Breast cancer Sister 8       bilateral, diagnosed in other breast at age 79   Cancer Paternal Aunt        colon, possible breast cancer, dx 50s/60s   Arthritis Other        aunts   Stomach cancer Maternal Uncle        dx 3s   Intellectual disability Paternal Uncle    Cancer Paternal Aunt        gastrointestinal, dx 50s/60s   Stroke Neg Hx    Her mother died at age 56 with congestive heart failure. Her father died at age 60 from heart disease. Molly Molly Tucker has 2 brothers and 4 sisters. She reports bilateral breast cancers in one sister-- the first at age 55, and the other breast at age 67.  She also has 2 paternal aunts that she believes had breast cancer   GYNECOLOGIC HISTORY:  No LMP recorded. Patient is postmenopausal. Menarche: 88 years old Age at first live birth: 88 years old Molly Tucker P 2 LMP HRT never used  Hysterectomy? no BSO? no   SOCIAL HISTORY: (updated 12/2020)  Molly Tucker is currently retired from working for [employer].  Her husband  is 80, has some speech difficulties, and has a colostomy.  He has no use of his right hand which means she ends up being the caregiver for that.  Also at home is the patient's son Molly Molly Tucker Key who drives a truck.  Son Shanon Brow lives in [place] and works in Press photographer.  The patient has 2 grandsons 1 granddaughter and one great granddaughter.  She belongs to the [place]: She has named her son Molly Molly Tucker Key as her healthcare power of attorney   HEALTH MAINTENANCE: Social History   Tobacco Use   Smoking status: Former    Types: Cigarettes    Quit date: 10/30/1976    Years since quitting: 45.8   Smokeless tobacco: Never   Tobacco comments:    Patient would ONLY smoke occasionally   Vaping Use   Vaping Use: Never used  Substance Use Topics   Alcohol use: No   Drug use: No     Colonoscopy:   PAP:  Bone density: Remote; took alendronate at some point   No  Known Allergies  Current Outpatient Medications  Medication Sig Dispense Refill   acetaminophen (TYLENOL) 500 MG tablet Take 500 mg by mouth every 6 (six) hours as needed for moderate Tucker.     anastrozole (ARIMIDEX) 1 MG tablet Take 1 tablet (1 mg total) by mouth daily. 90 tablet 3   Cholecalciferol (VITAMIN D) 50 MCG (2000 UT) tablet Take 2,000 Units by mouth every morning.     clonazePAM (KLONOPIN) 0.5 MG tablet Take 0.5-1 tablets (0.25-0.5 mg total) by mouth at bedtime as needed for anxiety. 30 tablet 3   ELIQUIS 5 MG TABS tablet TAKE 1 TABLET BY MOUTH TWICE  DAILY 200 tablet 2   furosemide (LASIX) 20 MG tablet Take one tablet by mouth ( 20 mg) daily 90 tablet 2   metoprolol succinate (TOPROL XL) 25 MG 24 hr tablet Take 1 tablet (25 mg total) by mouth daily. 90 tablet 3   No current facility-administered medications for this visit.    OBJECTIVE:  Vitals:   08/17/22 1550  BP: (!) 149/71  Pulse: 92  Resp: 18  Temp: (!) 97.2 F (36.2 C)  SpO2: 98%     Body mass index is 25.15 kg/m.   Wt  Readings from Last 3 Encounters:  08/17/22 146 lb 8 oz (66.5 kg)  06/27/22 140 lb (63.5 kg)  06/21/22 143 lb (64.9 kg)      ECOG FS:1 - Symptomatic but completely ambulatory  Physical Exam Constitutional:      Appearance: Normal appearance.  Chest:     Comments: Bilateral breasts inspected.  No palpable masses or regional adenopathy Musculoskeletal:        General: Normal range of motion.     Cervical back: Normal range of motion and neck supple. No rigidity.  Lymphadenopathy:     Cervical: No cervical adenopathy.  Skin:    General: Skin is warm and dry.  Neurological:     General: No focal deficit present.     Mental Status: She is alert.       LAB RESULTS:  CMP     Component Value Date/Time   NA 143 05/24/2022 1632   K 4.6 05/24/2022 1632   CL 105 05/24/2022 1632   CO2 24 05/24/2022 1632   GLUCOSE 73 05/24/2022 1632   GLUCOSE 85 01/05/2021 1534   BUN 12 05/24/2022 1632   CREATININE 0.71 05/24/2022 1632   CREATININE 0.90 01/05/2021 1534   CREATININE 0.87 12/14/2015 1415   CALCIUM 9.3 05/24/2022 1632   PROT 6.7 01/05/2021 1534   PROT 6.1 11/08/2020 1601   ALBUMIN 3.7 01/05/2021 1534   ALBUMIN 3.9 11/08/2020 1601   AST 23 01/05/2021 1534   ALT 20 01/05/2021 1534   ALKPHOS 64 01/05/2021 1534   BILITOT 0.9 01/05/2021 1534   GFRNONAA >60 01/05/2021 1534   GFRAA 68 12/14/2020 1239    No results found for: "TOTALPROTELP", "ALBUMINELP", "A1GS", "A2GS", "BETS", "BETA2SER", "GAMS", "MSPIKE", "SPEI"  Lab Results  Component Value Date   WBC 4.6 08/01/2021   NEUTROABS 3.9 01/05/2021   HGB 14.2 08/01/2021   HCT 43.3 08/01/2021   MCV 92 08/01/2021   PLT 161 08/01/2021    No results found for: "LABCA2"  No components found for: "QZESPQ330"  No results for input(s): "INR" in the last 168 hours.  No results found for: "LABCA2"  No results found for: "QTM226"  No results found for: "CAN125"  No results found for: "CAN153"  No results found for:  "CA2729"  No components found  for: "HGQUANT"  No results found for: "CEA1", "CEA" / No results found for: "CEA1", "CEA"   No results found for: "AFPTUMOR"  No results found for: "CHROMOGRNA"  No results found for: "KPAFRELGTCHN", "LAMBDASER", "KAPLAMBRATIO" (kappa/lambda light chains)  No results found for: "HGBA", "HGBA2QUANT", "HGBFQUANT", "HGBSQUAN" (Hemoglobinopathy evaluation)   No results found for: "LDH"  No results found for: "IRON", "TIBC", "IRONPCTSAT" (Iron and TIBC)  No results found for: "FERRITIN"  Urinalysis    Component Value Date/Time   COLORURINE YELLOW 10/31/2018 1041   APPEARANCEUR Sl Cloud[place] (A) 10/31/2018 1041   LABSPEC 1.020 10/31/2018 1041   PHURINE 5.5 10/31/2018 1041   GLUCOSEU NEGATIVE 10/31/2018 1041   HGBUR MODERATE (A) 10/31/2018 1041   BILIRUBINUR NEGATIVE 10/31/2018 1041   KETONESUR NEGATIVE 10/31/2018 1041   UROBILINOGEN 0.2 10/31/2018 1041   NITRITE NEGATIVE 10/31/2018 1041   LEUKOCYTESUR MODERATE (A) 10/31/2018 1041     STUDIES: No results found.   ELIGIBLE FOR AVAILABLE RESEARCH PROTOCOL: no  ASSESSMENT: 88 y.o. [place] woman status post left breast lower inner quadrant biopsy 11/30/2020 for a clinical T2 N0, stage IB invasive ductal carcinoma, grade 1 or 2, estrogen and progesterone receptor positive, HER-2 not amplified, with an MIB-1 of 5%.  (1) status post left breast lower inner quadrant biopsy 12/23/2020 for a pT2 pN0, stage IB invasive ductal carcinoma, grade 1, with negative margins  (a) a total of 3 left axillary lymph nodes removed, all clear  (2) patient declines genetics testing  (3) patient declines adjuvant radiation  (4) anastrozole started 01/10/2021   PLAN: Doshia is tolerating anastrozole well and the plan will be to continue that for a total of 5 years. Mammogram March 2023 neg for malignancy. No concerns on PE today. She has previously and today expressed that she doesn't want to do bone density  scans and she doesn't want to take anything for osteoporosis She will RTC in 1 yr or sooner as needed. She will alternate for breast exam with her other doctors in 6 months. Total time: 20 minutes  *Total Encounter Time as defined by the Centers for Medicare and Medicaid Services includes, in addition to the face-to-face time of a patient visit (documented in the note above) non-face-to-face time: obtaining and reviewing outside history, ordering and reviewing medications, tests or procedures, care coordination (communications with other health care professionals or caregivers) and documentation in the medical record.

## 2022-08-17 NOTE — Progress Notes (Signed)
Subjective:    Patient ID: Molly Tucker, female    DOB: 11/09/1931, 88 y.o.   MRN: [id number]      HPI Ahonesty is here for No chief complaint on file.   08/08/22 - tripped and fell while watering the lawn. She got up but was sore.  No bruises or cuts.  She continues to be sore when she lays on her back, sneezes, or turns a certain way.  The most pain is under the breast on the right side-it is localized.  ? Fractured rib.    She has been sleeping in recliner-not able to sleep in bed.  Today the pain has been okay.  Yesterday she was very sore.  She has been taking Tylenol and occasionally she will take 2 Advil with the Tylenol.  She did take her son's tramadol a couple of times.    She has noticed increased bilateral leg swelling.  She does wear compression socks and takes Lasix 20 mg daily.  She is not sure why is elevated.  Since the injury she has been sitting more and does not elevate her legs during the day, only at night.    Medications and allergies reviewed with patient and updated if appropriate.  Current Outpatient Medications on File Prior to Visit  Medication Sig Dispense Refill   acetaminophen (TYLENOL) 500 MG tablet Take 500 mg by mouth every 6 (six) hours as needed for moderate pain.     anastrozole (ARIMIDEX) 1 MG tablet Take 1 tablet (1 mg total) by mouth daily. 90 tablet 3   Cholecalciferol (VITAMIN D) 50 MCG (2000 UT) tablet Take 2,000 Units by mouth every morning.     clonazePAM (KLONOPIN) 0.5 MG tablet Take 0.5-1 tablets (0.25-0.5 mg total) by mouth at bedtime as needed for anxiety. 30 tablet 3   ELIQUIS 5 MG TABS tablet TAKE 1 TABLET BY MOUTH TWICE  DAILY 200 tablet 2   furosemide (LASIX) 20 MG tablet Take one tablet by mouth ( 20 mg) daily 90 tablet 2   metoprolol succinate (TOPROL XL) 25 MG 24 hr tablet Take 1 tablet (25 mg total) by mouth daily. 90 tablet 3   No current facility-administered medications on file prior to visit.    Review of Systems   Constitutional:  Negative for chills and fever.  Respiratory:  Negative for cough, shortness of breath and wheezing.   Cardiovascular:  Positive for leg swelling.       Objective:   Vitals:   08/18/22 1556 08/18/22 1601  BP: (!) 140/80 (!) 140/80  Pulse: 96   Temp: 98.2 F (36.8 C)   SpO2: 95%    BP Readings from Last 3 Encounters:  08/18/22 (!) 140/80  08/17/22 (!) 149/71  06/27/22 (!) 150/82   Wt Readings from Last 3 Encounters:  08/18/22 146 lb 2 oz (66.3 kg)  08/17/22 146 lb 8 oz (66.5 kg)  06/27/22 140 lb (63.5 kg)   Body mass index is 25.08 kg/m.    Physical Exam Constitutional:      General: She is not in acute distress.    Appearance: Normal appearance.  HENT:     Head: Normocephalic and atraumatic.  Cardiovascular:     Rate and Rhythm: Normal rate and regular rhythm.  Pulmonary:     Effort: Pulmonary effort is normal. No respiratory distress.     Breath sounds: Normal breath sounds. No wheezing.  Musculoskeletal:     Right lower leg: Edema (Mild swelling) present.  Left lower leg: Edema (Moderate lower leg swelling-more than right) present.  Skin:    General: Skin is warm and dry.     Findings: No rash.  Neurological:     Mental Status: She is alert. Mental status is at baseline.            Assessment & Plan:    See Problem List for Assessment and Plan of chronic medical problems.   Flu vaccine given today

## 2022-08-18 ENCOUNTER — Encounter: Payer: Self-pay | Admitting: Hematology and Oncology

## 2022-08-18 ENCOUNTER — Encounter: Payer: Self-pay | Admitting: Internal Medicine

## 2022-08-18 ENCOUNTER — Ambulatory Visit (INDEPENDENT_AMBULATORY_CARE_PROVIDER_SITE_OTHER): Payer: Medicare Other

## 2022-08-18 ENCOUNTER — Ambulatory Visit (INDEPENDENT_AMBULATORY_CARE_PROVIDER_SITE_OTHER): Payer: Medicare Other | Admitting: Internal Medicine

## 2022-08-18 VITALS — BP 140/80 | HR 96 | Temp 98.2°F | Ht 64.0 in | Wt 146.1 lb

## 2022-08-18 DIAGNOSIS — R600 Localized edema: Secondary | ICD-10-CM | POA: Insufficient documentation

## 2022-08-18 DIAGNOSIS — S2249XA Multiple fractures of ribs, unspecified side, initial encounter for closed fracture: Secondary | ICD-10-CM | POA: Insufficient documentation

## 2022-08-18 DIAGNOSIS — R0781 Pleurodynia: Secondary | ICD-10-CM

## 2022-08-18 DIAGNOSIS — Z23 Encounter for immunization: Secondary | ICD-10-CM

## 2022-08-18 DIAGNOSIS — R0789 Other chest pain: Secondary | ICD-10-CM | POA: Insufficient documentation

## 2022-08-18 IMAGING — MG MM BREAST LOCALIZATION CLIP
4 series · 4 of 12 positions shown · non-contrast
Comparison: Previous exam(s).

CLINICAL DATA: Status post ultrasound-guided biopsy of a subareolar
mass in the LEFT breast.

EXAM:
DIAGNOSTIC LEFT MAMMOGRAM POST ULTRASOUND BIOPSY

[L ML synth-2D]
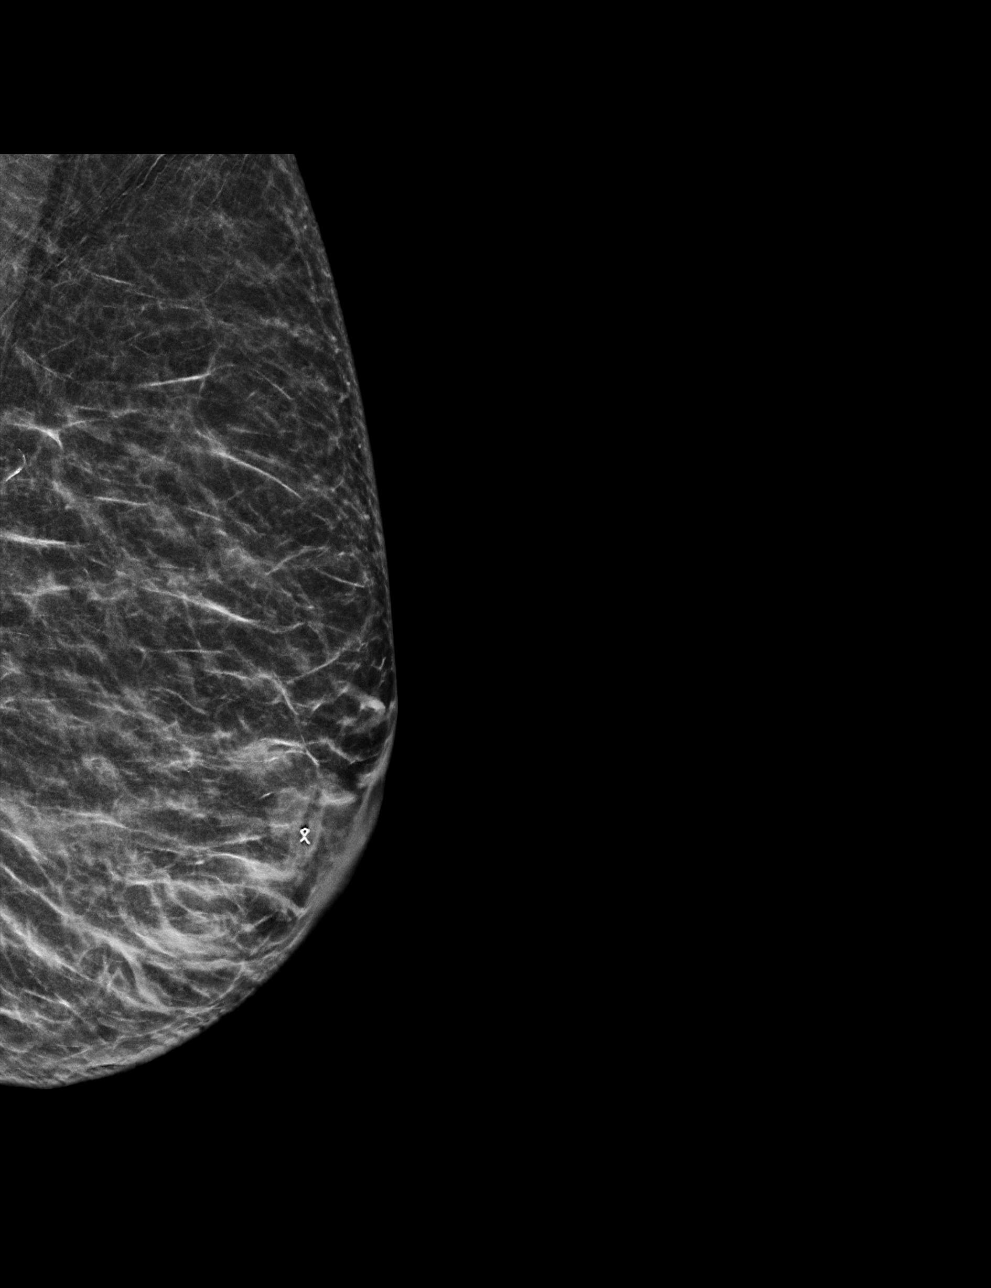

[L CC synth-2D]
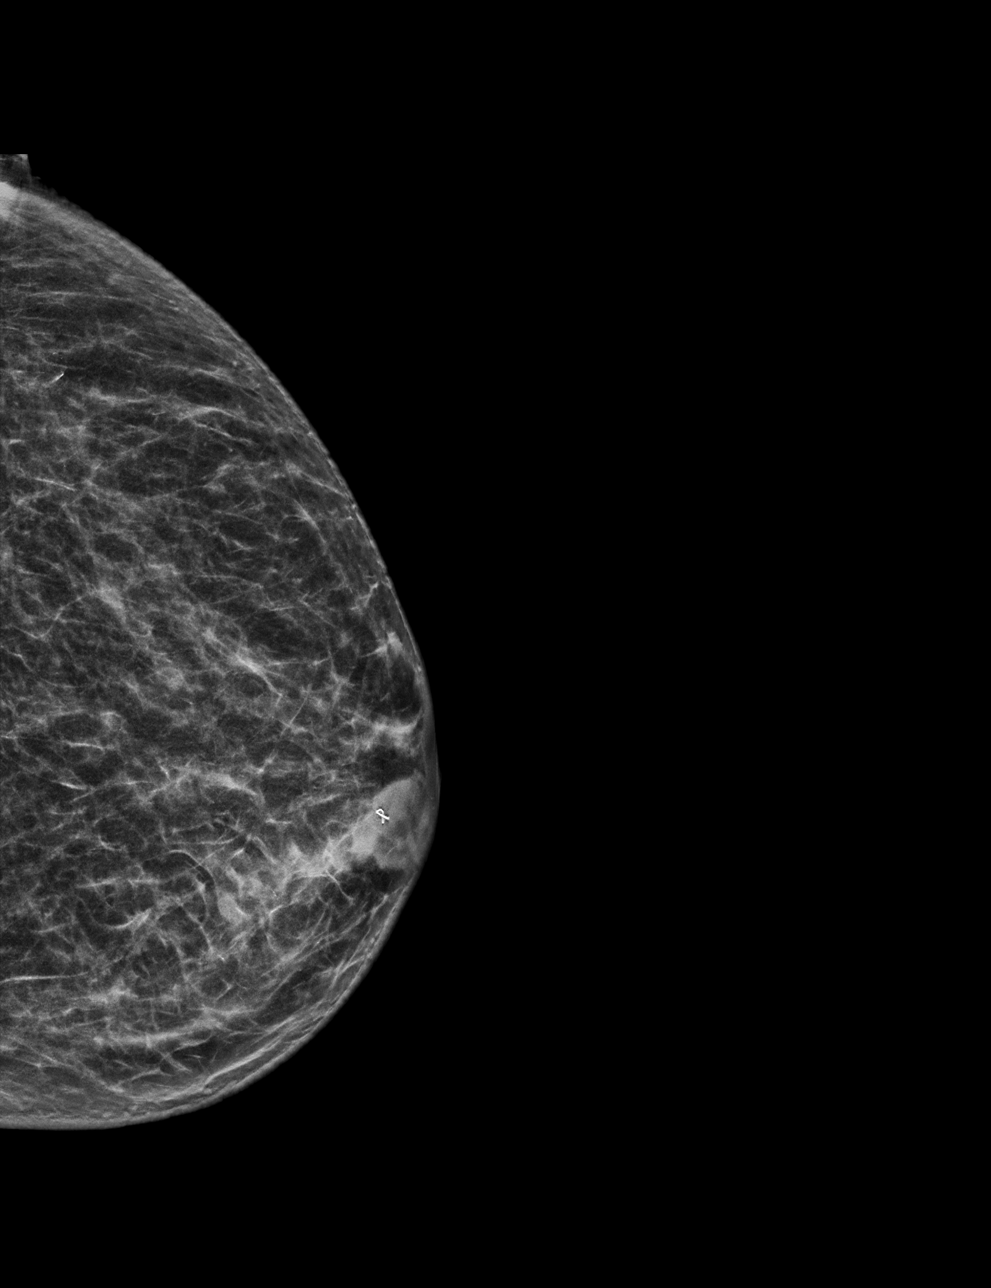

[L ML tomo · tomo slice 27/53.0]
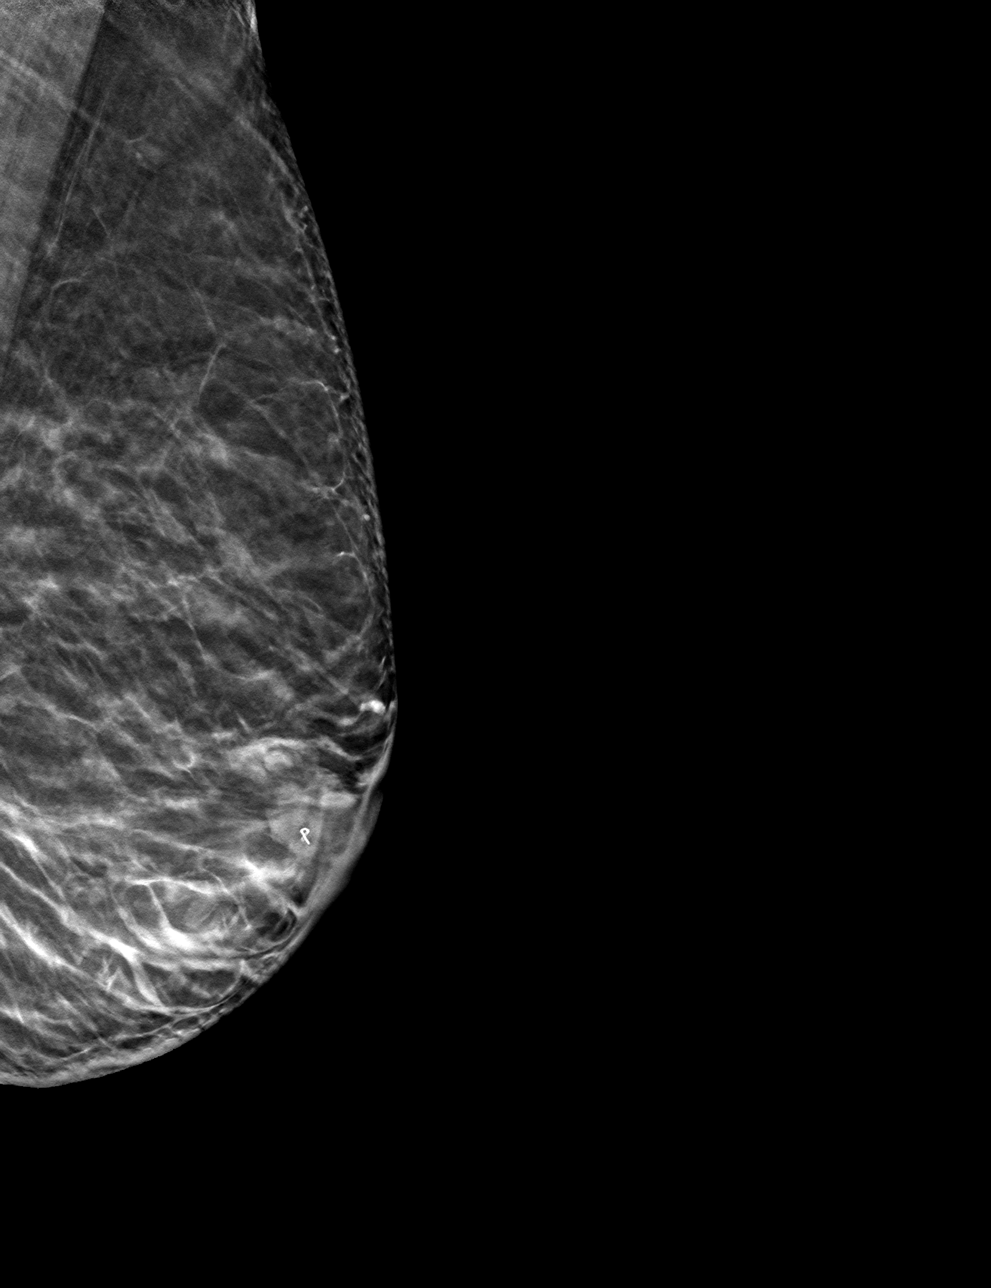

[L CC tomo · tomo slice 27/53.0]
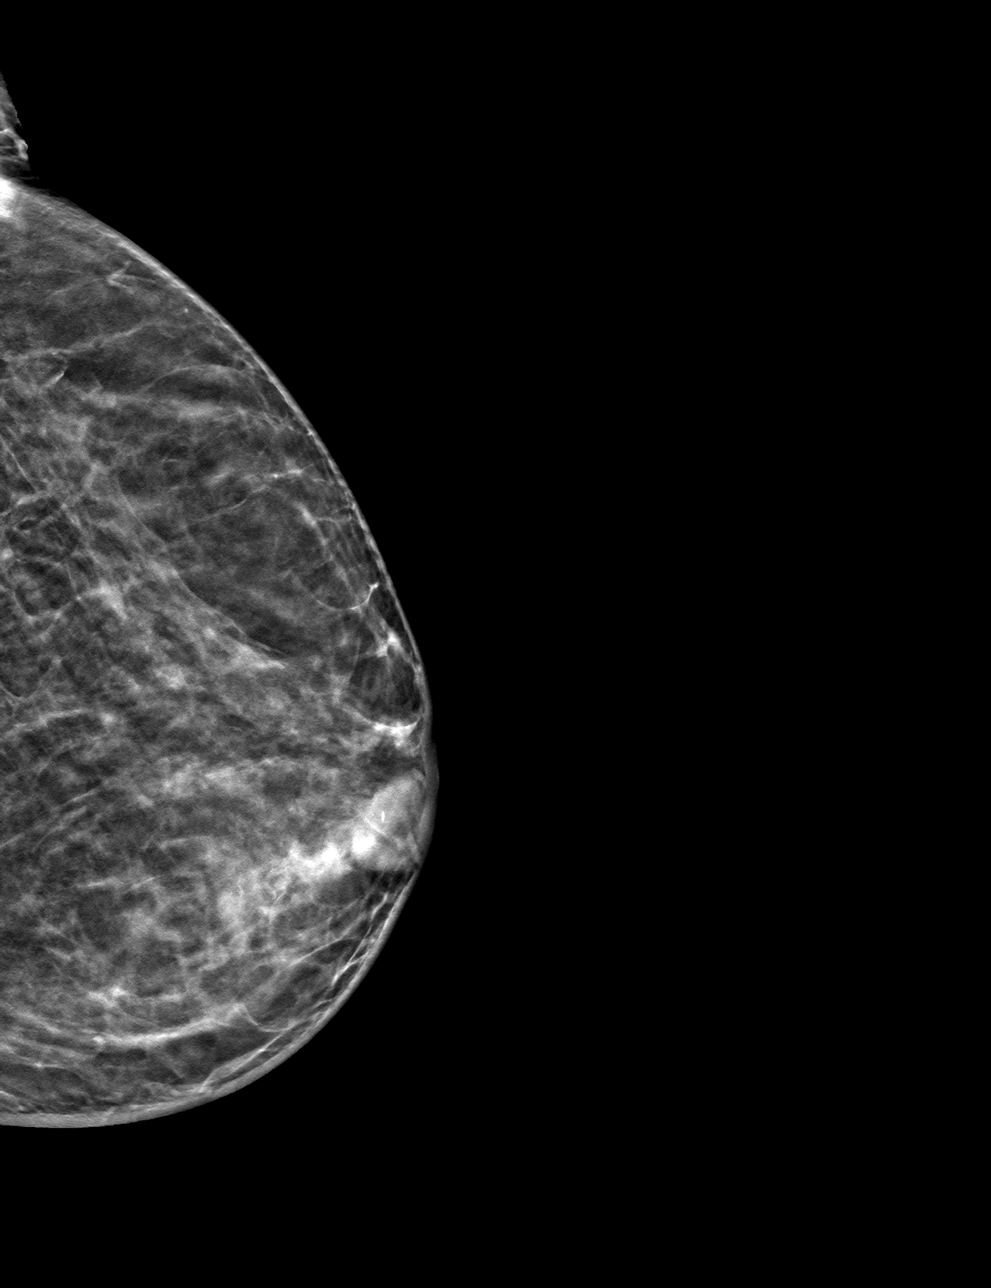

[4 of 12 positions shown; findings below may reference images not displayed]

FINDINGS: Mammographic images were obtained following ultrasound guided biopsy
of the subareolar LEFT breast mass. The biopsy marking clip is in
expected position at the site of biopsy.
IMPRESSION: Appropriate positioning of the ribbon shaped biopsy marking clip at
the site of biopsy in the subareolar LEFT breast.

Final Assessment: Post Procedure Mammograms for Marker Placement

## 2022-08-18 IMAGING — US US BREAST BX W LOC DEV 1ST LESION IMG BX SPEC US GUIDE*L*
1 series · 12 of 12 positions shown · non-contrast
Comparison: Previous exam(s).
COMPARISON: Previous exam(s).

Addendum:
CLINICAL DATA: Patient presents today for ultrasound-guided core
biopsy of a retroareolar LEFT breast mass.

EXAM:
ULTRASOUND GUIDED LEFT BREAST CORE NEEDLE BIOPSY

[Series 1: us breast bx w loc dev 1st lesion img bx spec us g · 0.07mm/px · 12 of 12 slices shown]
[im 1/12]
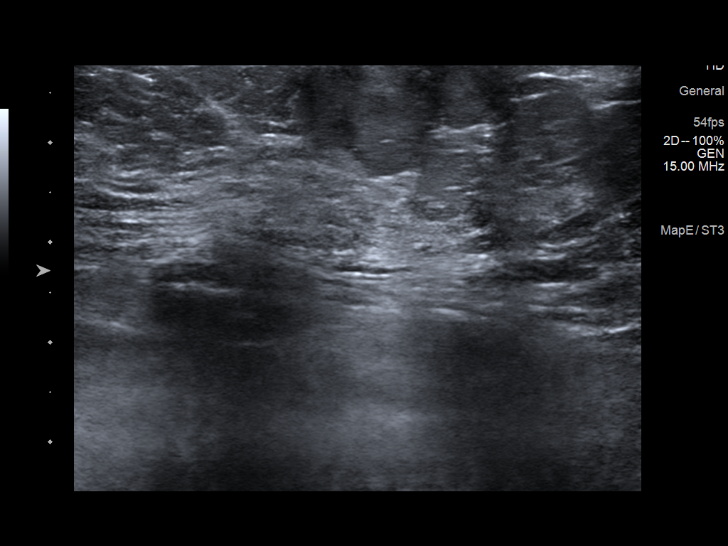
[im 2/12]
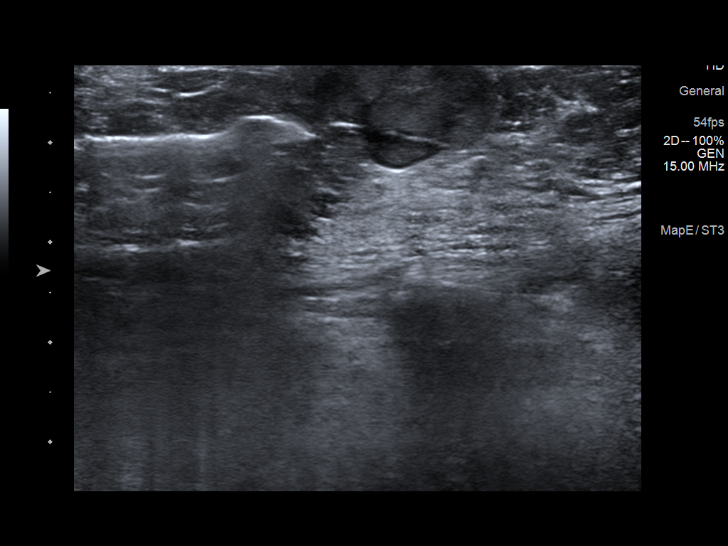
[im 3/12]
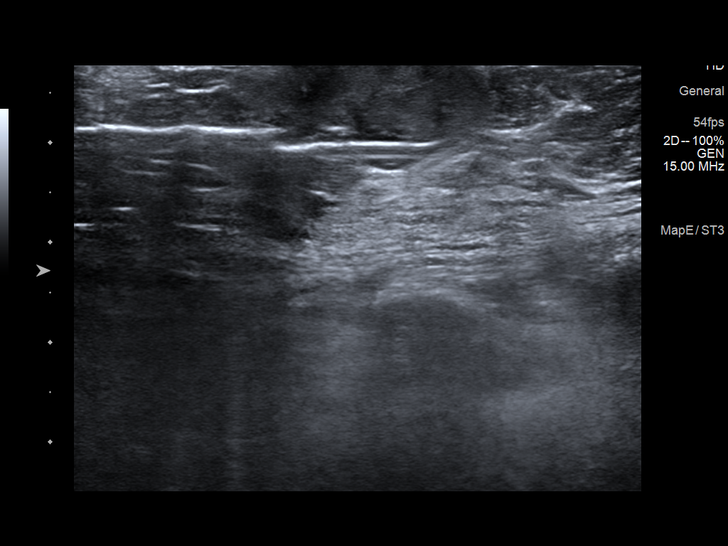
[im 4/12]
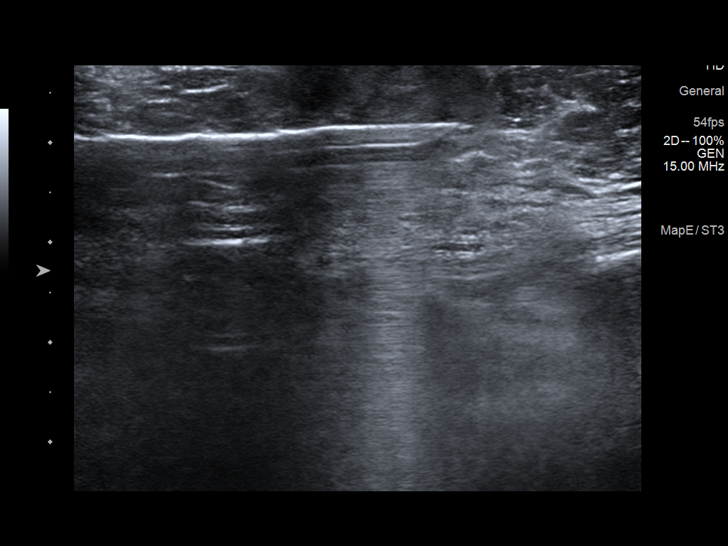
[im 5/12]
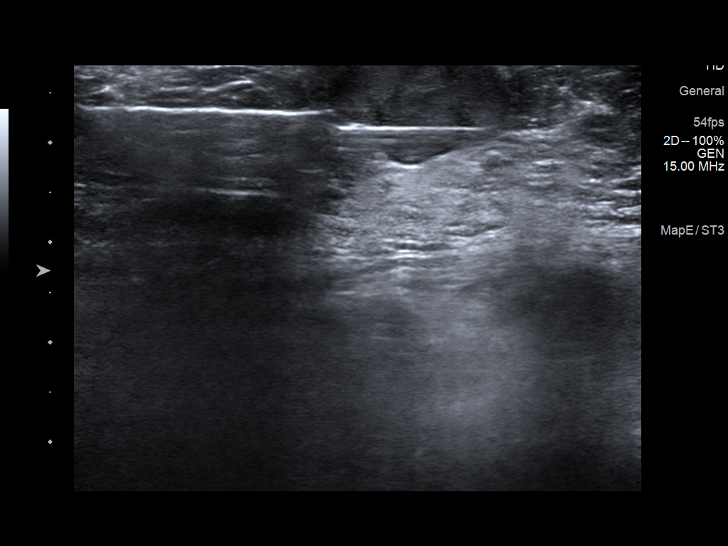
[im 6/12]
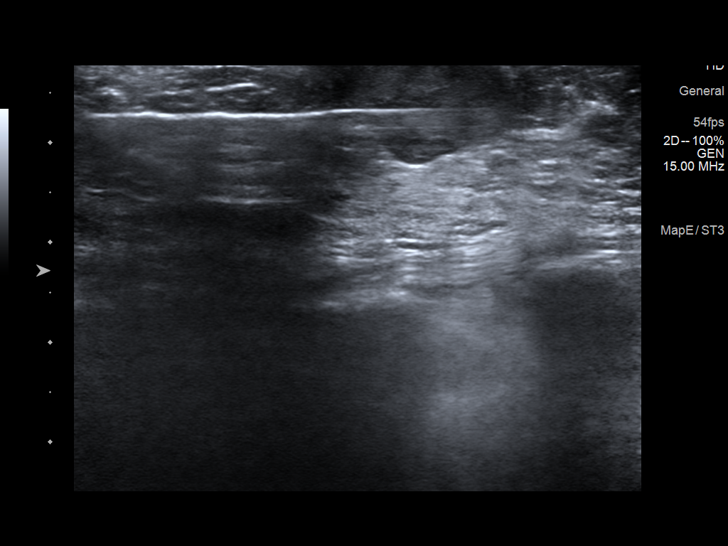
[im 7/12]
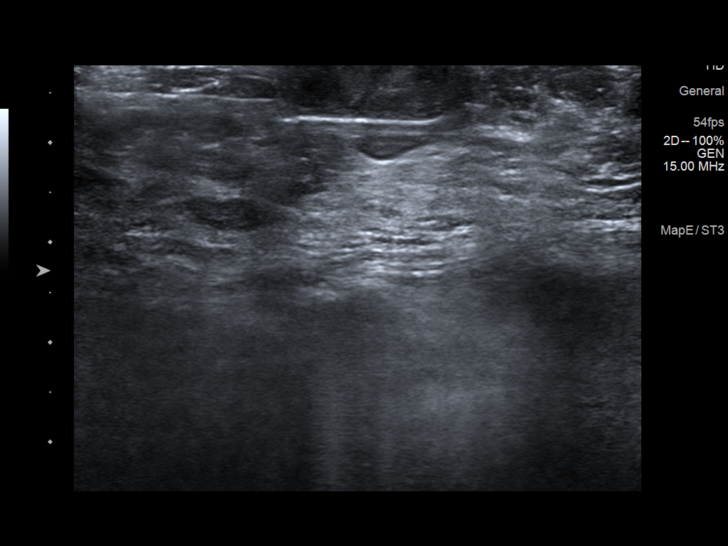
[im 8/12]
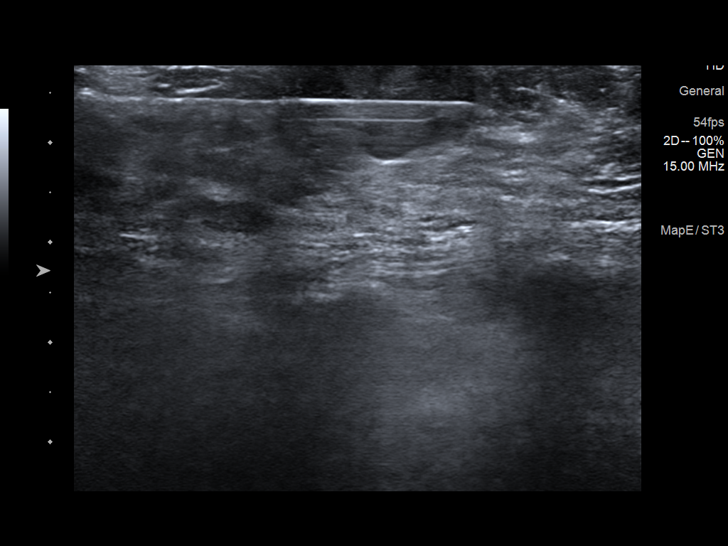
[im 9/12]
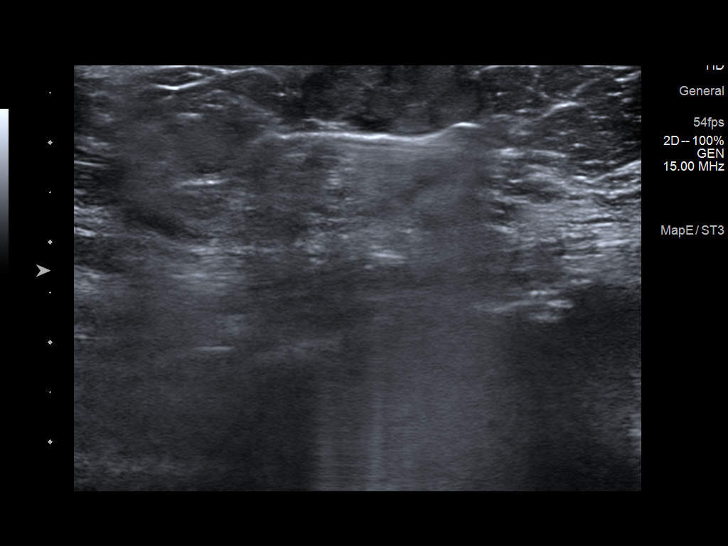
[im 10/12]
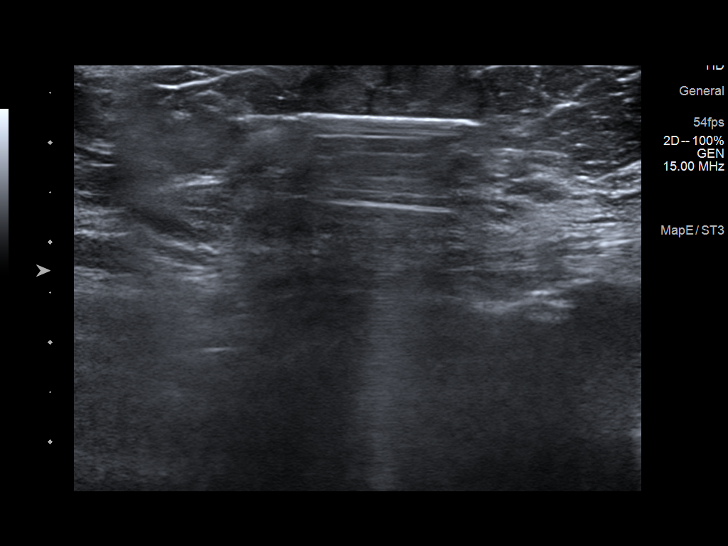
[im 11/12]
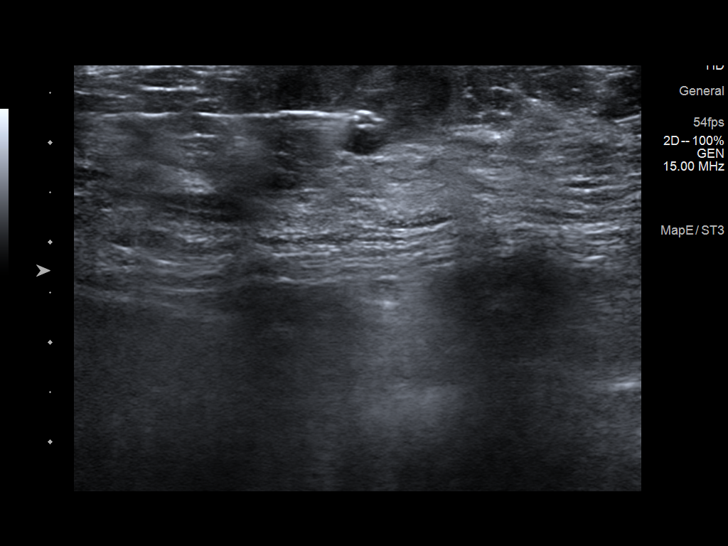
[im 12/12]
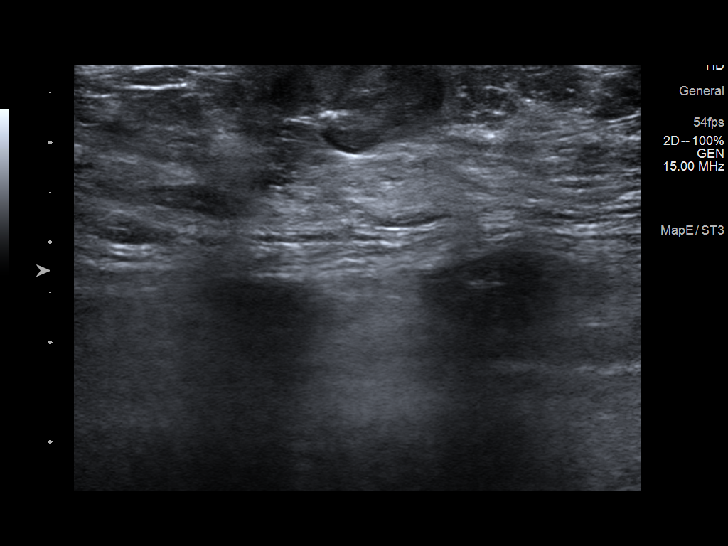

[12 of 12 positions shown; findings below may reference images not displayed]



Lesion quadrant: Subareolar

Using sterile technique and 1% Lidocaine as local anesthetic, under
direct ultrasound visualization, a 12 gauge Tat Yan device was
used to perform biopsy of the irregular mass within the subareolar
LEFT breast using a inferior approach. At the conclusion of the
procedure ribbon shaped tissue marker clip was deployed into the
biopsy cavity. Follow up 2 view mammogram was performed and dictated
separately.
IMPRESSION: Ultrasound guided biopsy of the subareolar LEFT breast mass. No
apparent complications.

ADDENDUM:
Pathology revealed GRADE I-II INVASIVE DUCTAL CARCINOMA WITH
EXTRACELLULAR MUCIN, DUCTAL CARCINOMA IN SITU of the Left breast,
subareolar . This was found to be concordant by Dr. Kazukimi Zoysa.

Pathology results were discussed with the patient by telephone. The
patient reported doing well after the biopsy with tenderness at the
site. Post biopsy instructions and care were reviewed and questions
were answered. The patient was encouraged to call The [REDACTED] for any additional concerns. My direct phone
number was provided.

Surgical consultation has been arranged with Dr. Szeli Cik at
[REDACTED] on December 06, 2020.

Pathology results reported by Ameerah North, RN on 12/01/2020.



Lesion quadrant: Subareolar

Using sterile technique and 1% Lidocaine as local anesthetic, under
direct ultrasound visualization, a 12 gauge Tat Yan device was
used to perform biopsy of the irregular mass within the subareolar
LEFT breast using a inferior approach. At the conclusion of the
procedure ribbon shaped tissue marker clip was deployed into the
biopsy cavity. Follow up 2 view mammogram was performed and dictated
separately.
IMPRESSION: Ultrasound guided biopsy of the subareolar LEFT breast mass. No
apparent complications.

## 2022-08-18 MED ORDER — TRAMADOL HCL 50 MG PO TABS: 50.0000 mg | ORAL_TABLET | Freq: Three times a day (TID) | ORAL | 0 refills | Status: AC | PRN

## 2022-08-18 NOTE — Assessment & Plan Note (Signed)
Acute Secondary to fall on 10/10 Pain is localized under her breast on the anterior-lateral rib Possible fracture-rib/chest x-ray today Discussed treatment is still pain management Tramadol 50 mg every 8 hours as needed-take Tylenol with it Advised to avoid ibuprofen since she is on Eliquis Pain should improve with time-call with any questions or concerns

## 2022-08-18 NOTE — Patient Instructions (Addendum)
     Flu immunization administered today.     Have a chest xray downstairs.     Medications changes include :   tramadol as needed - take tylenol with it       Return if symptoms worsen or fail to improve.

## 2022-08-18 NOTE — Assessment & Plan Note (Signed)
Acute on chronic Leg swelling is worse since 10 days ago when she fell-she is less active and sitting more with her legs down, which is likely the cause She is taking her Eliquis daily as prescribed so I am not concerned about a blood clot Advised elevating her legs more when sitting Can take 2 Lasix-40 mg daily for couple of days to get rid of some of the fluid if needed, but should improve with increased elevating when sitting

## 2022-09-08 ENCOUNTER — Other Ambulatory Visit: Payer: Self-pay

## 2022-09-08 MED ORDER — METOPROLOL SUCCINATE ER 25 MG PO TB24: 25.0000 mg | ORAL_TABLET | Freq: Every day | ORAL | 2 refills | Status: DC

## 2022-09-11 ENCOUNTER — Other Ambulatory Visit: Payer: Self-pay | Admitting: *Deleted

## 2022-09-11 MED ORDER — ANASTROZOLE 1 MG PO TABS: 1.0000 mg | ORAL_TABLET | Freq: Every day | ORAL | 3 refills | Status: DC

## 2022-09-27 NOTE — Progress Notes (Unsigned)
Cardiology Office Note Date:  09/27/2022  Patient ID:  Molly Tucker, Molly Tucker 03/23/1932, MRN [id number] PCP:  Binnie Rail, MD  Electrophysiologist:  [name]   Chief Complaint:   80mo History of Present Illness: Molly HOLLYWOODis a 88y.o. female with history of PAFib, HTN, HLD, and osteoporosis, chronic CHF (diastolic).  She comes in today to be seen for Dr. KCaryl Comes last seen by him it looks, 2017, following regularly with LTommas Olp NP. Most recently 12/14/20, noted Dec 2021 pt called with recurrent Afib, her amio increased, though wanted to wait until after the holidays to come in.  Amio making her feel a bit nauseated.  Wanted to try DCCV, scheduled for a lumpectomy the following week. Planned to follow up in a month to visit DCCV vs rate control strategy, noting not as symptomatic with her Afib as she had been historically.   I saw her 01/20/21 She is recovering from her lumpectomy, still a bit sore, though doing well. She can tell she is is still in Afib, "something is just different" No CP, no SOB. She thinks she may tend to get a little more swollen intermittently Though does not take the lasix every day. She notes the swelling towrads mid-late day, gone in the morning. No dizzy spells, near syncope or syncope. She is able to do the things she needs and wants to do. From the Afib perspective she thinks what bothers her the most is the stomach upset that she is fairly certain is the amiodarone She had a nose bleed not too long ago that took a while to stop, none again, no bleeding or signs of bleeding otherwise. She was no longer interested in rhythm control, cardioversions and her amiodarone stopped Planned for 664mo/u  I saw her 03/03/21 Feels much better off the amiodarone No CP, no real palpitations or cardiac awareness No SOB, DOE No dizzy spells, near syncope or syncope NO bleeding or signs of bleeding  She wears support stockings when out of the house, has no  swelling when she wakes but accumulates through the day She has had "terrible veins" in her legs for many many years No changes were made, she was asked to monitor her BP, planned for [id number]   I saw her 08/01/21 She is all in all doing OK.  Some days she just doesn't think she has as much energy as she did when in SR, though other days feels pretty good. She denies difficulty with ADLs but some days has more energy then others, or better stamina. No CP, no SOB or DOE. No bleeding or signs of bleeding No near syncope or syncope She is able to see her pulse in her neck, this is worrisome to her She takes the lasix when she is swelling, will skip it days that she is busy, doesn't take it every day   I did not think she had further rhythm control options, HR was good Planned for daily lasix with some volume, and updating her echo  LVEF 70-75%, hyperdynamic, no WMA, mod LVH RV enlargement with mod elevated p. Pressures septum with speckled appearance  consider ordering a limited echocardiogram with strain to assess for  cardiac amyloid.  Reviewed with Dr. KleCaryl Comeso recommended limited echo  strain to assess for cardiac amyloid 1) urine serum Immunoglobulins with immunofixation  2) urine for light chains  3) Pyrophosphate scan (PYP scan)  She has deferred the above 2/2 husband's illness.  I  saw her 05/24/22 Sadly her husband passed away since her last visit. She doing fairly well Noted when up and on her feet more like of late that she is more swollen in her feet/legs then usual She has numerous superficial spider veins and known varicose veins, and suspects is this. She had not been taking her lasix regularly but has resumed daily dosing the last few weeks. She has been on her feet a lot going through the house, getting ready to start to downsize. No CP, no palpitations She walks her dog around the block for exercise, when coming back towards her house is a bit of an incline and  gets her winded. Not new for her She does which she had more energy She has numbness to the fingertips of her left thumb and 4th finger, her PMD felt pinched nerve  We discussed testing recommended by [name]. She is very weary about going down a road of a bunch pf testing and new/more medicines at her age Discussed rational for the testing  She does not want to do them, no changes for now. Lasix dose was pulsed  Planned for echo and labs and 4 mo f/u  No echo ordered it seems, ? Suspect echo discussion was a left over from her prior visit perhaps.  *** symptoms *** eliquis, bleeding, dose, labs *** volume *** had a trip/fall in the yard last mo  PAF Hx: Diagnosed April 2016 DCCV: 2016 2017 2018 2020 Failed April 2021  AAD Amiodarone started March 2021 subsequently stopped for rate control strategy march 2022   Past Medical History:  Diagnosis Date   Arthritis    Atrial tachycardia    Breast cancer ([place]) 11/30/2020   Carotid bruit 2005   right, carotid doppler: <39% occlusion   Complication of anesthesia    Dysrhythmia    Chronic A. Fib   Family history of breast cancer    Family history of GI tract cancer    Family history of stomach cancer    Hyperlipidemia    LDL goal =<120   Hyperplastic colonic polyp 2006   Dr. Carlean Purl, due 2016   Hypertension    Osteoporosis    S/P  biphosphonates x 5 yrs   Persistent atrial fibrillation (HCC)    PONV (postoperative nausea and vomiting)     Past Surgical History:  Procedure Laterality Date   BREAST LUMPECTOMY     BREAST LUMPECTOMY WITH SENTINEL LYMPH NODE BIOPSY Left 12/23/2020   Procedure: LEFT BREAST LUMPECTOMY WITH SENTINEL LYMPH NODE BX;  Surgeon: Stark Klein, MD;  Location: [facility];  Service: General;  Laterality: Left;  RNFA, PEC BLOCK; START TIME OF 1030 rm 2 per ivey   CARDIOVERSION N/A 03/31/2015   Procedure: CARDIOVERSION;  Surgeon: Sanda Klein, MD;  Location: [facility];  Service: Cardiovascular;   Laterality: N/A;   CARDIOVERSION N/A 12/21/2015   Procedure: CARDIOVERSION;  Surgeon: Lelon Perla, MD;  Location: [facility];  Service: Cardiovascular;  Laterality: N/A;   CARDIOVERSION N/A 01/09/2017   Procedure: CARDIOVERSION;  Surgeon: Jerline Pain, MD;  Location: [facility];  Service: Cardiovascular;  Laterality: N/A;   CARDIOVERSION N/A 07/09/2019   Procedure: CARDIOVERSION;  Surgeon: Sanda Klein, MD;  Location: [facility];  Service: Cardiovascular;  Laterality: N/A;   CARDIOVERSION N/A 01/14/2020   Procedure: CARDIOVERSION;  Surgeon: Geralynn Rile, MD;  Location: [facility];  Service: Cardiovascular;  Laterality: N/A;   CARDIOVERSION N/A 02/23/2020   Procedure: CARDIOVERSION;  Surgeon: Jerline Pain,  MD;  Location: [facility];  Service: Cardiovascular;  Laterality: N/A;   COLONOSCOPY W/ POLYPECTOMY  10/30/2004   DILATION AND CURETTAGE OF UTERUS     MOUTH SURGERY  2009-2010   implants, Dr. Marcelyn Ditty    Current Outpatient Medications  Medication Sig Dispense Refill   acetaminophen (TYLENOL) 500 MG tablet Take 500 mg by mouth every 6 (six) hours as needed for moderate pain.     anastrozole (ARIMIDEX) 1 MG tablet Take 1 tablet (1 mg total) by mouth daily. 90 tablet 3   Cholecalciferol (VITAMIN D) 50 MCG (2000 UT) tablet Take 2,000 Units by mouth every morning.     clonazePAM (KLONOPIN) 0.5 MG tablet Take 0.5-1 tablets (0.25-0.5 mg total) by mouth at bedtime as needed for anxiety. 30 tablet 3   ELIQUIS 5 MG TABS tablet TAKE 1 TABLET BY MOUTH TWICE  DAILY 200 tablet 2   furosemide (LASIX) 20 MG tablet Take one tablet by mouth ( 20 mg) daily 90 tablet 2   metoprolol succinate (TOPROL XL) 25 MG 24 hr tablet Take 1 tablet (25 mg total) by mouth daily. 90 tablet 2   No current facility-administered medications for this visit.    Allergies:   Patient has no known allergies.   Social History:  The patient  reports that she quit smoking about 45 years ago. Her  smoking use included cigarettes. She has never used smokeless tobacco. She reports that she does not drink alcohol and does not use drugs.   Family History:  The patient's family history includes Arthritis in an other family member; Breast cancer (age of onset: 28) in her sister; Cancer in her paternal aunt and paternal aunt; Coronary artery disease in her father and sister; Intellectual disability in her paternal uncle; Stomach cancer in her maternal uncle.  ROS:  Please see the history of present illness.    All other systems are reviewed and otherwise negative.   PHYSICAL EXAM:  VS:  There were no vitals taken for this visit. BMI: There is no height or weight on file to calculate BMI. Well nourished, well developed, in no acute distress  HEENT: normocephalic, atraumatic  Neck: no JVD, carotid bruits or masses Cardiac:  ***  irreg-irreg, soft SM, no rubs, or gallops Lungs:  *** CTA b/l, no wheezing, rhonchi or rales  Abd: soft, nontender MS: no deformity age appropriate atrophy Ext: *** wearing support hose, no pitting edema, known by prior exam to have numerous spider veins with some large veins as well, some today noted even with the stockings on Skin: warm and dry, no rash Neuro:  No gross deficits appreciated Psych: euthymic mood, full affect   EKG:  Not done today  08/23/21 TTE 1. Left ventricular ejection fraction, by estimation, is 70 to 75%. The  left ventricle has hyperdynamic function. The left ventricle has no  regional wall motion abnormalities. There is moderate left ventricular  hypertrophy. Left ventricular diastolic  parameters are indeterminate.   2. Right ventricular systolic function is normal. The right ventricular  size is moderately enlarged. There is mildly elevated pulmonary artery  systolic pressure.   3. Left atrial size was moderately dilated.   4. Right atrial size was severely dilated.   5. The mitral valve is normal in structure. Mild mitral valve   regurgitation.   6. The aortic valve is normal in structure. Aortic valve regurgitation is  not visualized. No aortic stenosis is present.   7. The inferior vena cava is dilated  in size with >50% respiratory  variability, suggesting right atrial pressure of 8 mmHg.   Conclusion(s)/Recommendation(s): No significant change from prior echo  03/08/2020. With LVH, biatrial enlargement, septum with speckled appearance  consider ordering a limited echocardiogram with strain to assess for  cardiac amyloid.    03/08/2020; TTE IMPRESSIONS   1. Left ventricular ejection fraction, by estimation, is 60 to 65%. The  left ventricle has normal function. The left ventricle has no regional  wall motion abnormalities. Left ventricular diastolic parameters are  consistent with Grade II diastolic  dysfunction (pseudonormalization). Elevated left ventricular end-diastolic  pressure.   2. Right ventricular systolic function is normal. The right ventricular  size is moderately enlarged. There is moderately elevated pulmonary artery  systolic pressure. The estimated right ventricular systolic pressure is  02.6 mmHg.   3. Left atrial size was moderately dilated.   4. Right atrial size was severely dilated.   5. The mitral valve is normal in structure. Mild mitral valve  regurgitation. No evidence of mitral stenosis.   6. Tricuspid valve regurgitation is moderate.   7. The aortic valve is tricuspid. Aortic valve regurgitation is not  visualized. Mild aortic valve sclerosis is present, with no evidence of  aortic valve stenosis.   8. The inferior vena cava is normal in size with <50% respiratory  variability, suggesting right atrial pressure of 8 mmHg.   03/08/15: Echocardiogram Study Conclusions - Left ventricle: The cavity size was normal. Wall thickness was   normal. Systolic function was normal. The estimated ejection   fraction was in the range of 55% to 60%. Wall motion was normal;   there were no  regional wall motion abnormalities. - Right ventricle: The cavity size was mildly dilated. - Right atrium: The atrium was mildly dilated. - Atrial septum: No defect or patent foramen ovale was identified. - Tricuspid valve: There was moderate regurgitation. LA 57m  Recent Labs: 05/24/2022: BUN 12; Creatinine, Ser 0.71; Potassium 4.6; Sodium 143  No results found for requested labs within last 365 days.   CrCl cannot be calculated (Patient's most recent lab result is older than the maximum 21 days allowed.).   Wt Readings from Last 3 Encounters:  08/18/22 146 lb 2 oz (66.3 kg)  08/17/22 146 lb 8 oz (66.5 kg)  06/27/22 140 lb (63.5 kg)     Other studies reviewed: Additional studies/records reviewed today include: summarized above  ASSESSMENT AND PLAN:  1.   Longstanding Persistent >> permanent AFib       CHADS2Vasc is 5 on Eliquis, *** appropriately dosed       ***                 2. HTN     *** Looks OK  3. Non-pitting edema     dependent and 2/2 venous insufficiency  4. Chronic CHF (diastolic) ***      Disposition: *** .   Current medicines are reviewed at length with the patient today.  The patient did not have any concerns regarding medicines.  SHaywood Lasso PA-C 09/27/2022 1:14 PM     [place] [phone](office)  [phone](fax)

## 2022-09-28 ENCOUNTER — Ambulatory Visit: Payer: Medicare Other | Attending: Internal Medicine | Admitting: Physician Assistant

## 2022-09-28 ENCOUNTER — Ambulatory Visit: Payer: Medicare Other | Admitting: Internal Medicine

## 2022-09-28 VITALS — BP 148/90 | HR 94 | Ht 64.0 in | Wt 139.0 lb

## 2022-09-28 DIAGNOSIS — I4821 Permanent atrial fibrillation: Secondary | ICD-10-CM | POA: Diagnosis not present

## 2022-09-28 DIAGNOSIS — Z79899 Other long term (current) drug therapy: Secondary | ICD-10-CM | POA: Diagnosis not present

## 2022-09-28 DIAGNOSIS — I5032 Chronic diastolic (congestive) heart failure: Secondary | ICD-10-CM

## 2022-09-28 DIAGNOSIS — I10 Essential (primary) hypertension: Secondary | ICD-10-CM

## 2022-09-28 NOTE — Patient Instructions (Signed)
Medication Instructions:   Your physician recommends that you continue on your current medications as directed. Please refer to the Current Medication list given to you today.   *If you need a refill on your cardiac medications before your next appointment, please call your pharmacy*   Lab Work:   BMET AND  CBC TODAY    If you have labs (blood work) drawn today and your tests are completely normal, you will receive your results only by: [place] (if you have MyChart) OR A paper copy in the mail If you have any lab test that is abnormal or we need to change your treatment, we will call you to review the results.   Testing/Procedures: NONE ORDERED  TODAY    Follow-Up: At [facility], you and your health needs are our priority.  As part of our continuing mission to provide you with exceptional heart care, we have created designated Provider Care Teams.  These Care Teams include your primary Cardiologist (physician) and Advanced Practice Providers (APPs -  Physician Assistants and Nurse Practitioners) who all work together to provide you with the care you need, when you need it.  We recommend signing up for the patient portal called "MyChart".  Sign up information is provided on this After Visit Summary.  MyChart is used to connect with patients for Virtual Visits (Telemedicine).  Patients are able to view lab/test results, encounter notes, upcoming appointments, etc.  Non-urgent messages can be sent to your provider as well.   To learn more about what you can do with MyChart, go to [email or web address].    Your next appointment:   6 month(s)  The format for your next appointment:   In Person  Provider:   Virl Axe, MD or Tommye Standard, PA-C    Other Instructions  Important Information About Sugar

## 2022-09-29 LAB — CBC
Hematocrit: 42.8 % (ref 34.0–46.6)
Hemoglobin: 14.2 g/dL (ref 11.1–15.9)
MCH: 30.1 pg (ref 26.6–33.0)
MCHC: 33.2 g/dL (ref 31.5–35.7)
MCV: 91 fL (ref 79–97)
Platelets: 176 10*3/uL (ref 150–450)
RBC: 4.71 x10E6/uL (ref 3.77–5.28)
RDW: 12.7 % (ref 11.7–15.4)
WBC: 4.9 10*3/uL (ref 3.4–10.8)

## 2022-09-29 LAB — BASIC METABOLIC PANEL
BUN/Creatinine Ratio: 23 (ref 12–28)
BUN: 17 mg/dL (ref 8–27)
CO2: 24 mmol/L (ref 20–29)
Calcium: 9.6 mg/dL (ref 8.7–10.3)
Chloride: 105 mmol/L (ref 96–106)
Creatinine, Ser: 0.74 mg/dL (ref 0.57–1.00)
Glucose: 82 mg/dL (ref 70–99)
Potassium: 4.6 mmol/L (ref 3.5–5.2)
Sodium: 142 mmol/L (ref 134–144)
eGFR: 78 mL/min/{1.73_m2} (ref >59)

## 2022-10-22 ENCOUNTER — Other Ambulatory Visit: Payer: Self-pay | Admitting: Internal Medicine

## 2022-10-30 ENCOUNTER — Other Ambulatory Visit: Payer: Self-pay | Admitting: Internal Medicine

## 2022-12-07 ENCOUNTER — Encounter (HOSPITAL_COMMUNITY): Payer: Self-pay | Admitting: *Deleted

## 2023-01-16 ENCOUNTER — Ambulatory Visit
Admission: RE | Admit: 2023-01-16 | Discharge: 2023-01-16 | Disposition: A | Discharge status: Home / Self Care | Payer: Medicare Other | Source: Ambulatory Visit | Attending: Hematology and Oncology | Admitting: Hematology and Oncology

## 2023-01-16 DIAGNOSIS — Z170 Estrogen receptor positive status [ER+]: Secondary | ICD-10-CM

## 2023-03-20 ENCOUNTER — Encounter: Payer: Self-pay | Admitting: Internal Medicine

## 2023-03-25 ENCOUNTER — Other Ambulatory Visit: Payer: Self-pay | Admitting: Physician Assistant

## 2023-04-24 ENCOUNTER — Other Ambulatory Visit: Payer: Self-pay | Admitting: Internal Medicine

## 2023-04-24 NOTE — Telephone Encounter (Signed)
Prescription refill request for Eliquis received. Indication:afib Last office visit:11/23 Scr:0.74  11/23 Age: 89 Weight:63  kg  Prescription refilled

## 2023-05-17 ENCOUNTER — Other Ambulatory Visit: Payer: Self-pay | Admitting: Internal Medicine

## 2023-05-27 ENCOUNTER — Other Ambulatory Visit: Payer: Self-pay | Admitting: Internal Medicine

## 2023-05-30 ENCOUNTER — Encounter (INDEPENDENT_AMBULATORY_CARE_PROVIDER_SITE_OTHER): Payer: Self-pay

## 2023-06-13 ENCOUNTER — Ambulatory Visit (INDEPENDENT_AMBULATORY_CARE_PROVIDER_SITE_OTHER): Payer: Medicare Other

## 2023-06-13 VITALS — Ht 64.0 in | Wt 140.0 lb

## 2023-06-13 DIAGNOSIS — Z0000 Encounter for general adult medical examination without abnormal findings: Secondary | ICD-10-CM

## 2023-06-13 NOTE — Progress Notes (Signed)
Subjective:   Molly Tucker is a 89 y.o. female who presents for Medicare Annual (Subsequent) preventive examination.  Visit Complete: Virtual  I connected with  Molly Tucker on 06/13/23 by a audio enabled telemedicine application and verified that I am speaking with the correct person using two identifiers.  Patient Location: Home  Provider Location: Office/Clinic  I discussed the limitations of evaluation and management by telemedicine. The patient expressed understanding and agreed to proceed.  Vital Signs: Unable to obtain new vitals due to this being a telehealth visit. Patient provided weight for this visit.   Review of Systems     Cardiac Risk Factors include: advanced age (>42men, >68 women);dyslipidemia;hypertension     Objective:    Today's Vitals   06/13/23 1133 06/13/23 1134  Weight: 140 lb (63.5 kg)   Height: 5\' 4"  (1.626 m)   PainSc: 0-No pain 0-No pain   Body mass index is 24.03 kg/m.     06/13/2023   11:36 AM 12/15/2020    1:38 PM 09/10/2020    3:55 PM 02/23/2020   10:27 AM 01/14/2020   11:15 AM 07/09/2019    9:11 AM 01/09/2017    1:07 PM  Advanced Directives  Does Patient Have a Medical Advance Directive? Yes Yes Yes Yes Yes Yes Yes  Type of Estate agent of [place];Living will Living will  Healthcare Power of [place];Living will Healthcare Power of [place];Living will Living will;Healthcare Power of Attorney Living will;Healthcare Power of Attorney  Does patient want to make changes to medical advance directive?  No - Patient declined No - Patient declined      Copy of Healthcare Power of Attorney in Chart? No - copy requested   No - copy requested No - copy requested No - copy requested     Current Medications (verified) Outpatient Encounter Medications as of 06/13/2023  Medication Sig   acetaminophen (TYLENOL) 500 MG tablet Take 500 mg by mouth every 6 (six) hours as needed for moderate pain.   anastrozole (ARIMIDEX) 1 MG  tablet Take 1 tablet (1 mg total) by mouth daily.   Cholecalciferol (VITAMIN D) 50 MCG (2000 UT) tablet Take 2,000 Units by mouth every morning.   clonazePAM (KLONOPIN) 0.5 MG tablet TAKE 1/2 TO 1 TABLET BY MOUTH AT BEDTIME AS NEEDED FOR ANXIETY.   ELIQUIS 5 MG TABS tablet TAKE 1 TABLET BY MOUTH TWICE  DAILY   furosemide (LASIX) 20 MG tablet Take one tablet by mouth ( 20 mg) daily   metoprolol succinate (TOPROL-XL) 25 MG 24 hr tablet TAKE 1 TABLET BY MOUTH ONCE  DAILY   No facility-administered encounter medications on file as of 06/13/2023.    Allergies (verified) Patient has no known allergies.   History: Past Medical History:  Diagnosis Date   Arthritis    Atrial tachycardia    Breast cancer (HCC) 11/30/2020   Carotid bruit 2005   right, carotid doppler: <39% occlusion   Complication of anesthesia    Dysrhythmia    Chronic A. Fib   Family history of breast cancer    Family history of GI tract cancer    Family history of stomach cancer    Hyperlipidemia    LDL goal =<120   Hyperplastic colonic polyp 2006   Dr. Leone Payor, due 2016   Hypertension    Osteoporosis    S/P  biphosphonates x 5 yrs   Persistent atrial fibrillation (HCC)    PONV (postoperative nausea and vomiting)    Past  Surgical History:  Procedure Laterality Date   BREAST LUMPECTOMY     BREAST LUMPECTOMY WITH SENTINEL LYMPH NODE BIOPSY Left 12/23/2020   Procedure: LEFT BREAST LUMPECTOMY WITH SENTINEL LYMPH NODE BX;  Surgeon: Almond Lint, MD;  Location: [facility] OR;  Service: General;  Laterality: Left;  RNFA, PEC BLOCK; START TIME OF 1030 rm 2 per ivey   CARDIOVERSION N/A 03/31/2015   Procedure: CARDIOVERSION;  Surgeon: Thurmon Fair, MD;  Location: [facility] ENDOSCOPY;  Service: Cardiovascular;  Laterality: N/A;   CARDIOVERSION N/A 12/21/2015   Procedure: CARDIOVERSION;  Surgeon: Lewayne Bunting, MD;  Location: [facility] ENDOSCOPY;  Service: Cardiovascular;  Laterality: N/A;   CARDIOVERSION N/A 01/09/2017   Procedure:  CARDIOVERSION;  Surgeon: Jake Bathe, MD;  Location: [facility] ENDOSCOPY;  Service: Cardiovascular;  Laterality: N/A;   CARDIOVERSION N/A 07/09/2019   Procedure: CARDIOVERSION;  Surgeon: Thurmon Fair, MD;  Location: [facility] ENDOSCOPY;  Service: Cardiovascular;  Laterality: N/A;   CARDIOVERSION N/A 01/14/2020   Procedure: CARDIOVERSION;  Surgeon: Sande Rives, MD;  Location: [facility] ENDOSCOPY;  Service: Cardiovascular;  Laterality: N/A;   CARDIOVERSION N/A 02/23/2020   Procedure: CARDIOVERSION;  Surgeon: Jake Bathe, MD;  Location: [facility] ENDOSCOPY;  Service: Cardiovascular;  Laterality: N/A;   COLONOSCOPY W/ POLYPECTOMY  10/30/2004   DILATION AND CURETTAGE OF UTERUS     MOUTH SURGERY  2009-2010   implants, Dr. Dereck Leep   Family History  Problem Relation Age of Onset   Coronary artery disease Father        MI in 66s   Coronary artery disease Sister        stent 2009   Breast cancer Sister 45       bilateral, diagnosed in other breast at age 64   Cancer Paternal Aunt        colon, possible breast cancer, dx 50s/60s   Arthritis Other        aunts   Stomach cancer Maternal Uncle        dx 62s   Intellectual disability Paternal Uncle    Cancer Paternal Aunt        gastrointestinal, dx 50s/60s   Stroke Neg Hx    Social History   Socioeconomic History   Marital status: Widowed    Spouse name: Not on file   Number of children: Not on file   Years of education: Not on file   Highest education level: Not on file  Occupational History   Occupation: Retired    Associate Professor: [employer]  Tobacco Use   Smoking status: Former    Current packs/day: 0.00    Types: Cigarettes    Quit date: 10/30/1976    Years since quitting: 46.6   Smokeless tobacco: Never   Tobacco comments:    Patient would ONLY smoke occasionally   Vaping Use   Vaping status: Never Used  Substance and Sexual Activity   Alcohol use: No   Drug use: No   Sexual activity: Not on file  Other Topics Concern   Not on file   Social History Narrative   Regular exercise: yes: walking 30 minute once daily   Social Determinants of Health   Financial Resource Strain: Low Risk  (06/13/2023)   Overall Financial Resource Strain (CARDIA)    Difficulty of Paying Living Expenses: Not hard at all  Food Insecurity: No Food Insecurity (06/13/2023)   Hunger Vital Sign    Worried About Running Out of Food in the Last Year: Never true    Ran Out  of Food in the Last Year: Never true  Transportation Needs: No Transportation Needs (06/13/2023)   PRAPARE - Administrator, Civil Service (Medical): No    Lack of Transportation (Non-Medical): No  Physical Activity: Insufficiently Active (06/13/2023)   Exercise Vital Sign    Days of Exercise per Week: 7 days    Minutes of Exercise per Session: 20 min  Stress: No Stress Concern Present (06/13/2023)   [employer] of Occupational Health - Occupational Stress Questionnaire    Feeling of Stress : Only a little  Social Connections: Unknown (06/13/2023)   Social Connection and Isolation Panel [NHANES]    Frequency of Communication with Friends and Family: More than three times a week    Frequency of Social Gatherings with Friends and Family: More than three times a week    Attends Religious Services: Patient unable to answer    Active Member of Clubs or Organizations: Yes    Attends Engineer, structural: More than 4 times per year    Marital Status: Married    Tobacco Counseling Counseling given: Not Answered Tobacco comments: Patient would ONLY smoke occasionally    Clinical Intake:  Pre-visit preparation completed: Yes  Pain : No/denies pain Pain Score: 0-No pain     BMI - recorded: 24.03 Nutritional Status: BMI of 19-24  Normal Nutritional Risks: None Diabetes: No  How often do you need to have someone help you when you read instructions, pamphlets, or other written materials from your doctor or pharmacy?: 1 - Never What is the last grade  level you completed in school?: HSG  Interpreter Needed?: No  Information entered by ::  N. , LPN.   Activities of Daily Living    06/13/2023   11:40 AM 06/11/2023   12:15 PM  In your present state of health, do you have any difficulty performing the following activities:  Hearing? 0 0  Vision? 0 0  Difficulty concentrating or making decisions? 0 0  Walking or climbing stairs? 0 0  Dressing or bathing? 0 0  Doing errands, shopping? 0 0  Preparing Food and eating ? N N  Using the Toilet? N N  In the past six months, have you accidently leaked urine? N N  Do you have problems with loss of bowel control? N N  Managing your Medications? N N  Managing your Finances? N N  Housekeeping or managing your Housekeeping? N N    Patient Care Team: Pincus Sanes, MD as PCP - General (Internal Medicine) Duke Salvia, MD as PCP - Cardiology (Cardiology) Kathyrn Sheriff, [facility] (Inactive) as Pharmacist (Pharmacist) Manning Charity, OD as Referring Physician (Optometry) Antony Contras, MD as Consulting Physician (Ophthalmology) Pershing Proud, RN as Oncology Nurse Navigator Donnelly Angelica, RN as Oncology Nurse Navigator  Indicate any recent Medical Services you may have received from other than [facility] providers in the past year (date may be approximate).     Assessment:   This is a routine wellness examination for Carinne.  Hearing/Vision screen Hearing Screening - Comments:: Denies hearing difficulties. No hearing aids.   Vision Screening - Comments:: Wears rx glasses - up to date with routine eye exams with Manning Charity, OD.   Dietary issues and exercise activities discussed:     Goals Addressed             This Visit's Progress    Patient Stated       My healthcare goal for 2024 is to  maintain my current health status by continuing to eat healthy, stay independent, physically and socially active.      Depression Screen    06/13/2023   11:37 AM  08/18/2022    3:59 PM 06/27/2022    2:44 PM 03/02/2022   10:09 AM 09/10/2020    3:54 PM 09/10/2020    3:01 PM 09/09/2019    2:17 PM  PHQ 2/9 Scores  PHQ - 2 Score 0 0 0 1 0 0 0  PHQ- 9 Score 1 0 1 4       Fall Risk    06/11/2023   12:15 PM 08/18/2022    3:59 PM 06/27/2022    2:43 PM 06/23/2022   11:51 AM 03/02/2022   10:09 AM  Fall Risk   Falls in the past year? 0 1 1 1  0  Number falls in past yr: 0 1 0 0 0  Injury with Fall? 1 0 1 1 0  Comment   hand, thumb. got treated. no fracture    Risk for fall due to :   Impaired balance/gait  No Fall Risks  Follow up  Falls evaluation completed Falls evaluation completed  Falls evaluation completed    MEDICARE RISK AT HOME:  Medicare Risk at Home - 06/13/23 1143     Any stairs in or around the home? Yes    If so, are there any without handrails? No    Home free of loose throw rugs in walkways, pet beds, electrical cords, etc? Yes    Adequate lighting in your home to reduce risk of falls? Yes    Life alert? No    Use of a cane, walker or w/c? No    Grab bars in the bathroom? Yes    Shower chair or bench in shower? Yes    Elevated toilet seat or a handicapped toilet? Yes             TIMED UP AND GO:  Was the test performed?  No    Cognitive Function:        06/13/2023   11:42 AM 09/10/2020    3:57 PM  6CIT Screen  What Year? 0 points 0 points  What month? 0 points 0 points  What time? 0 points 0 points  Count back from 20 0 points 0 points  Months in reverse 0 points 0 points  Repeat phrase 0 points 0 points  Total Score 0 points 0 points    Immunizations Immunization History  Administered Date(s) Administered   Fluad Quad(high Dose 65+) 08/04/2019, 08/16/2020   Influenza Split 08/31/2011, 11/14/2012   Influenza Whole 09/10/2007   Influenza, High Dose Seasonal PF 11/21/2013, 10/19/2015, 08/29/2017   Influenza,inj,Quad PF,6+ Mos 09/09/2021, 08/18/2022   Influenza-Unspecified 07/30/2014, 08/01/2018    PFIZER(Purple Top)SARS-COV-2 Vaccination 11/19/2019, 12/10/2019   Pneumococcal Conjugate-13 08/04/2019   Tdap 08/22/2011, 06/21/2022    TDAP status: Up to date  Flu Vaccine status: Up to date  Pneumococcal vaccine status: Due, Education has been provided regarding the importance of this vaccine. Advised may receive this vaccine at local pharmacy or Health Dept. Aware to provide a copy of the vaccination record if obtained from local pharmacy or Health Dept. Verbalized acceptance and understanding.  Covid-19 vaccine status: Completed vaccines  Qualifies for Shingles Vaccine? Yes   Zostavax completed No   Shingrix Completed?: No.    Education has been provided regarding the importance of this vaccine. Patient has been advised to call insurance company to determine out of  pocket expense if they have not yet received this vaccine. Advised may also receive vaccine at local pharmacy or Health Dept. Verbalized acceptance and understanding.  Screening Tests Health Maintenance  Topic Date Due   Zoster Vaccines- Shingrix (1 of 2) Never done   COVID-19 Vaccine (3 - Pfizer risk series) 01/07/2020   Pneumonia Vaccine 76+ Years old (2 of 2 - PPSV23 or PCV20) 08/03/2020   INFLUENZA VACCINE  05/31/2023   DEXA SCAN  09/10/2034 (Originally 12/25/1998)   Medicare Annual Wellness (AWV)  06/12/2024   DTaP/Tdap/Td (3 - Td or Tdap) 06/21/2032   HPV VACCINES  Aged Out    Health Maintenance  Health Maintenance Due  Topic Date Due   Zoster Vaccines- Shingrix (1 of 2) Never done   COVID-19 Vaccine (3 - Pfizer risk series) 01/07/2020   Pneumonia Vaccine 10+ Years old (2 of 2 - PPSV23 or PCV20) 08/03/2020   INFLUENZA VACCINE  05/31/2023    Colorectal cancer screening: No longer required.   Mammogram status: Completed 01/16/2023. Repeat every year  Bone Density status: Postponed by primary care  Lung Cancer Screening: (Low Dose CT Chest recommended if Age 11-80 years, 20 pack-year currently smoking OR  have quit w/in 15years.) does not qualify.   Lung Cancer Screening Referral: no  Additional Screening:  Hepatitis C Screening: does not qualify.  Vision Screening: Recommended annual ophthalmology exams for early detection of glaucoma and other disorders of the eye. Is the patient up to date with their annual eye exam?  Yes  Who is the provider or what is the name of the office in which the patient attends annual eye exams? Manning Charity, OD. If pt is not established with a provider, would they like to be referred to a provider to establish care? No .   Dental Screening: Recommended annual dental exams for proper oral hygiene  Diabetic Foot Exam: N/A  Community Resource Referral / Chronic Care Management: CRR required this visit?  No   CCM required this visit?  No     Plan:     I have personally reviewed and noted the following in the patient's chart:   Medical and social history Use of alcohol, tobacco or illicit drugs  Current medications and supplements including opioid prescriptions. Patient is not currently taking opioid prescriptions. Functional ability and status Nutritional status Physical activity Advanced directives List of other physicians Hospitalizations, surgeries, and ER visits in previous 12 months Vitals Screenings to include cognitive, depression, and falls Referrals and appointments  In addition, I have reviewed and discussed with patient certain preventive protocols, quality metrics, and best practice recommendations. A written personalized care plan for preventive services as well as general preventive health recommendations were provided to patient.     Mickeal Needy, LPN   1/61/0960   After Visit Summary: (Mail) Due to this being a telephonic visit, the after visit summary with patients personalized plan was offered to patient via mail   Nurse Notes: Normal cognitive status assessed by direct observation via telephone conversation by this  Nurse Health Advisor. No abnormalities found.

## 2023-06-13 NOTE — Patient Instructions (Addendum)
Molly Tucker , Thank you for taking time to come for your Medicare Wellness Visit. I appreciate your ongoing commitment to your health goals. Please review the following plan we discussed and let me know if I can assist you in the future.   Referrals/Orders/Follow-Ups/Clinician Recommendations: No  This is a list of the screening recommended for you and due dates:  Health Maintenance  Topic Date Due   Zoster (Shingles) Vaccine (1 of 2) Never done   COVID-19 Vaccine (3 - Pfizer risk series) 01/07/2020   Pneumonia Vaccine (2 of 2 - PPSV23 or PCV20) 08/03/2020   Flu Shot  05/31/2023   DEXA scan (bone density measurement)  09/10/2034*   Medicare Annual Wellness Visit  06/12/2024   DTaP/Tdap/Td vaccine (3 - Td or Tdap) 06/21/2032   HPV Vaccine  Aged Out  *Topic was postponed. The date shown is not the original due date.    Advanced directives: (Copy Requested) Please bring a copy of your health care power of attorney and living will to the office to be added to your chart at your convenience.  Next Medicare Annual Wellness Visit scheduled for next year: Yes  Preventive Care 87 Years and Older, Female Preventive care refers to lifestyle choices and visits with your health care provider that can promote health and wellness. What does preventive care include? A yearly physical exam. This is also called an annual well check. Dental exams once or twice a year. Routine eye exams. Ask your health care provider how often you should have your eyes checked. Personal lifestyle choices, including: Daily care of your teeth and gums. Regular physical activity. Eating a healthy diet. Avoiding tobacco and drug use. Limiting alcohol use. Practicing safe sex. Taking low-dose aspirin every day. Taking vitamin and mineral supplements as recommended by your health care provider. What happens during an annual well check? The services and screenings done by your health care provider during your annual well  check will depend on your age, overall health, lifestyle risk factors, and family history of disease. Counseling  Your health care provider may ask you questions about your: Alcohol use. Tobacco use. Drug use. Emotional well-being. Home and relationship well-being. Sexual activity. Eating habits. History of falls. Memory and ability to understand (cognition). Work and work Astronomer. Reproductive health. Screening  You may have the following tests or measurements: Height, weight, and BMI. Blood pressure. Lipid and cholesterol levels. These may be checked every 5 years, or more frequently if you are over 74 years old. Skin check. Lung cancer screening. You may have this screening every year starting at age 19 if you have a 30-pack-year history of smoking and currently smoke or have quit within the past 15 years. Fecal occult blood test (FOBT) of the stool. You may have this test every year starting at age 41. Flexible sigmoidoscopy or colonoscopy. You may have a sigmoidoscopy every 5 years or a colonoscopy every 10 years starting at age 3. Hepatitis C blood test. Hepatitis B blood test. Sexually transmitted disease (STD) testing. Diabetes screening. This is done by checking your blood sugar (glucose) after you have not eaten for a while (fasting). You may have this done every 1-3 years. Bone density scan. This is done to screen for osteoporosis. You may have this done starting at age 48. Mammogram. This may be done every 1-2 years. Talk to your health care provider about how often you should have regular mammograms. Talk with your health care provider about your test results, treatment options, and  if necessary, the need for more tests. Vaccines  Your health care provider may recommend certain vaccines, such as: Influenza vaccine. This is recommended every year. Tetanus, diphtheria, and acellular pertussis (Tdap, Td) vaccine. You may need a Td booster every 10 years. Zoster  vaccine. You may need this after age 11. Pneumococcal 13-valent conjugate (PCV13) vaccine. One dose is recommended after age 74. Pneumococcal polysaccharide (PPSV23) vaccine. One dose is recommended after age 24. Talk to your health care provider about which screenings and vaccines you need and how often you need them. This information is not intended to replace advice given to you by your health care provider. Make sure you discuss any questions you have with your health care provider. Document Released: 11/12/2015 Document Revised: 07/05/2016 Document Reviewed: 08/17/2015 Elsevier Interactive Patient Education  2017 [employer].  Fall Prevention in the Home Falls can cause injuries. They can happen to people of all ages. There are many things you can do to make your home safe and to help prevent falls. What can I do on the outside of my home? Regularly fix the edges of walkways and driveways and fix any cracks. Remove anything that might make you trip as you walk through a door, such as a raised step or threshold. Trim any bushes or trees on the path to your home. Use bright outdoor lighting. Clear any walking paths of anything that might make someone trip, such as rocks or tools. Regularly check to see if handrails are loose or broken. Make sure that both sides of any steps have handrails. Any raised decks and porches should have guardrails on the edges. Have any leaves, snow, or ice cleared regularly. Use sand or salt on walking paths during winter. Clean up any spills in your garage right away. This includes oil or grease spills. What can I do in the bathroom? Use night lights. Install grab bars by the toilet and in the tub and shower. Do not use towel bars as grab bars. Use non-skid mats or decals in the tub or shower. If you need to sit down in the shower, use a plastic, non-slip stool. Keep the floor dry. Clean up any water that spills on the floor as soon as it happens. Remove  soap buildup in the tub or shower regularly. Attach bath mats securely with double-sided non-slip rug tape. Do not have throw rugs and other things on the floor that can make you trip. What can I do in the bedroom? Use night lights. Make sure that you have a light by your bed that is easy to reach. Do not use any sheets or blankets that are too big for your bed. They should not hang down onto the floor. Have a firm chair that has side arms. You can use this for support while you get dressed. Do not have throw rugs and other things on the floor that can make you trip. What can I do in the kitchen? Clean up any spills right away. Avoid walking on wet floors. Keep items that you use a lot in easy-to-reach places. If you need to reach something above you, use a strong step stool that has a grab bar. Keep electrical cords out of the way. Do not use floor polish or wax that makes floors slippery. If you must use wax, use non-skid floor wax. Do not have throw rugs and other things on the floor that can make you trip. What can I do with my stairs? Do not leave any  items on the stairs. Make sure that there are handrails on both sides of the stairs and use them. Fix handrails that are broken or loose. Make sure that handrails are as long as the stairways. Check any carpeting to make sure that it is firmly attached to the stairs. Fix any carpet that is loose or worn. Avoid having throw rugs at the top or bottom of the stairs. If you do have throw rugs, attach them to the floor with carpet tape. Make sure that you have a light switch at the top of the stairs and the bottom of the stairs. If you do not have them, ask someone to add them for you. What else can I do to help prevent falls? Wear shoes that: Do not have high heels. Have rubber bottoms. Are comfortable and fit you well. Are closed at the toe. Do not wear sandals. If you use a stepladder: Make sure that it is fully opened. Do not climb a  closed stepladder. Make sure that both sides of the stepladder are locked into place. Ask someone to hold it for you, if possible. Clearly mark and make sure that you can see: Any grab bars or handrails. First and last steps. Where the edge of each step is. Use tools that help you move around (mobility aids) if they are needed. These include: Canes. Walkers. Scooters. Crutches. Turn on the lights when you go into a dark area. Replace any light bulbs as soon as they burn out. Set up your furniture so you have a clear path. Avoid moving your furniture around. If any of your floors are uneven, fix them. If there are any pets around you, be aware of where they are. Review your medicines with your doctor. Some medicines can make you feel dizzy. This can increase your chance of falling. Ask your doctor what other things that you can do to help prevent falls. This information is not intended to replace advice given to you by your health care provider. Make sure you discuss any questions you have with your health care provider. Document Released: 08/12/2009 Document Revised: 03/23/2016 Document Reviewed: 11/20/2014 Elsevier Interactive Patient Education  2017 [employer].

## 2023-06-18 ENCOUNTER — Encounter: Payer: Self-pay | Admitting: Internal Medicine

## 2023-06-18 NOTE — Progress Notes (Unsigned)
Subjective:    Patient ID: Molly Tucker, female    DOB: February 12, 1932, 89 y.o.   MRN: [id number]      HPI Molly Tucker is here for a Physical exam and her chronic medical problems.    Arthritis - pain wakes her up in the night or morning.  Usually her back and achy all over, leg cramping  She takes tylenol in the evening-typically 1000 mg and that does help.  She typically does not need any Tylenol during the day   Mild vertigo recently - lightheadedness and nausea.  Few weeks - occasionally.  Has taken meclizine.  Not worse with head movements.      Medications and allergies reviewed with patient and updated if appropriate.  Current Outpatient Medications on File Prior to Visit  Medication Sig Dispense Refill   acetaminophen (TYLENOL) 500 MG tablet Take 500 mg by mouth every 6 (six) hours as needed for moderate pain.     anastrozole (ARIMIDEX) 1 MG tablet Take 1 tablet (1 mg total) by mouth daily. 90 tablet 3   Cholecalciferol (VITAMIN D) 50 MCG (2000 UT) tablet Take 2,000 Units by mouth every morning.     clonazePAM (KLONOPIN) 0.5 MG tablet TAKE 1/2 TO 1 TABLET BY MOUTH AT BEDTIME AS NEEDED FOR ANXIETY. 30 tablet 0   ELIQUIS 5 MG TABS tablet TAKE 1 TABLET BY MOUTH TWICE  DAILY 180 tablet 3   furosemide (LASIX) 20 MG tablet Take one tablet by mouth ( 20 mg) daily 90 tablet 2   metoprolol succinate (TOPROL-XL) 25 MG 24 hr tablet TAKE 1 TABLET BY MOUTH ONCE  DAILY 90 tablet 0   No current facility-administered medications on file prior to visit.    Review of Systems  Constitutional:  Negative for fever.  Eyes:  Negative for visual disturbance.  Respiratory:  Positive for shortness of breath (with hills - no change). Negative for cough and wheezing.   Cardiovascular:  Positive for leg swelling (minimal). Negative for chest pain and palpitations.  Gastrointestinal:  Positive for nausea. Negative for abdominal pain, blood in stool, constipation and diarrhea.       No gerd   Genitourinary:  Negative for dysuria.  Musculoskeletal:  Positive for arthralgias, back pain (depending on acitivity) and neck pain.  Skin:  Negative for rash.  Neurological:  Positive for light-headedness. Negative for headaches.  Psychiatric/Behavioral:  Negative for dysphoric mood. The patient is not nervous/anxious.        Objective:   Vitals:   06/19/23 1047  BP: 116/64  Pulse: 84  Temp: 98 F (36.7 C)  SpO2: 97%   Filed Weights   06/19/23 1047  Weight: 142 lb (64.4 kg)   Body mass index is 24.37 kg/m.  BP Readings from Last 3 Encounters:  06/19/23 116/64  09/28/22 (!) 148/90  08/18/22 (!) 140/80    Wt Readings from Last 3 Encounters:  06/19/23 142 lb (64.4 kg)  06/13/23 140 lb (63.5 kg)  09/28/22 139 lb (63 kg)       Physical Exam Constitutional: She appears well-developed and well-nourished. No distress.  HENT:  Head: Normocephalic and atraumatic.  Right Ear: External ear normal. Normal ear canal and TM Left Ear: External ear normal.  Normal ear canal and TM Mouth/Throat: Oropharynx is clear and moist.  Eyes: Conjunctivae normal.  Neck: Neck supple. No tracheal deviation present. No thyromegaly present.  No carotid bruit  Cardiovascular: Normal rate, irregular rhythm and normal heart sounds.   3/6 murmur  heard.  Trace bilateral lower extremity edema. Pulmonary/Chest: Effort normal and breath sounds normal. No respiratory distress. She has no wheezes. She has no rales.  Breast: deferred   Abdominal: Soft. She exhibits no distension. There is no tenderness.  Lymphadenopathy: She has no cervical adenopathy.  Skin: Skin is warm and dry. She is not diaphoretic.  Psychiatric: She has a normal mood and affect. Her behavior is normal.     Lab Results  Component Value Date   WBC 4.9 09/28/2022   HGB 14.2 09/28/2022   HCT 42.8 09/28/2022   PLT 176 09/28/2022   GLUCOSE 82 09/28/2022   CHOL 195 04/19/2020   TRIG 102 04/19/2020   HDL 59 04/19/2020    LDLCALC 118 (H) 04/19/2020   ALT 20 01/05/2021   AST 23 01/05/2021   NA 142 09/28/2022   K 4.6 09/28/2022   CL 105 09/28/2022   CREATININE 0.74 09/28/2022   BUN 17 09/28/2022   CO2 24 09/28/2022   TSH 3.500 11/08/2020   INR 1.0 01/07/2020   HGBA1C 5.8 12/22/2009         Assessment & Plan:   Physical exam: Screening blood work  ordered Exercise  housework, walks dog x 30 minutes Weight  normal Substance abuse  none   Reviewed recommended immunizations.   Health Maintenance  Topic Date Due   Zoster Vaccines- Shingrix (1 of 2) Never done   COVID-19 Vaccine (3 - Pfizer risk series) 01/07/2020   Pneumonia Vaccine 55+ Years old (2 of 2 - PPSV23 or PCV20) 08/03/2020   INFLUENZA VACCINE  01/28/2024 (Originally 05/31/2023)   DEXA SCAN  09/10/2034 (Originally 12/25/1998)   Medicare Annual Wellness (AWV)  06/12/2024   DTaP/Tdap/Td (3 - Td or Tdap) 06/21/2032   HPV VACCINES  Aged Out      Discussed immunizations she should consider    See Problem List for Assessment and Plan of chronic medical problems.

## 2023-06-18 NOTE — Patient Instructions (Addendum)
Blood work was ordered.   The lab is on the first floor.    Medications changes include :   none    Return in about 6 months (around 12/20/2023) for follow up.    Health Maintenance, Female Adopting a healthy lifestyle and getting preventive care are important in promoting health and wellness. Ask your health care provider about: The right schedule for you to have regular tests and exams. Things you can do on your own to prevent diseases and keep yourself healthy. What should I know about diet, weight, and exercise? Eat a healthy diet  Eat a diet that includes plenty of vegetables, fruits, low-fat dairy products, and lean protein. Do not eat a lot of foods that are high in solid fats, added sugars, or sodium. Maintain a healthy weight Body mass index (BMI) is used to identify weight problems. It estimates body fat based on height and weight. Your health care provider can help determine your BMI and help you achieve or maintain a healthy weight. Get regular exercise Get regular exercise. This is one of the most important things you can do for your health. Most adults should: Exercise for at least 150 minutes each week. The exercise should increase your heart rate and make you sweat (moderate-intensity exercise). Do strengthening exercises at least twice a week. This is in addition to the moderate-intensity exercise. Spend less time sitting. Even light physical activity can be beneficial. Watch cholesterol and blood lipids Have your blood tested for lipids and cholesterol at 87 years of age, then have this test every 5 years. Have your cholesterol levels checked more often if: Your lipid or cholesterol levels are high. You are older than 87 years of age. You are at high risk for heart disease. What should I know about cancer screening? Depending on your health history and family history, you may need to have cancer screening at various ages. This may include screening  for: Breast cancer. Cervical cancer. Colorectal cancer. Skin cancer. Lung cancer. What should I know about heart disease, diabetes, and high blood pressure? Blood pressure and heart disease High blood pressure causes heart disease and increases the risk of stroke. This is more likely to develop in people who have high blood pressure readings or are overweight. Have your blood pressure checked: Every 3-5 years if you are 38-1 years of age. Every year if you are 14 years old or older. Diabetes Have regular diabetes screenings. This checks your fasting blood sugar level. Have the screening done: Once every three years after age 82 if you are at a normal weight and have a low risk for diabetes. More often and at a younger age if you are overweight or have a high risk for diabetes. What should I know about preventing infection? Hepatitis B If you have a higher risk for hepatitis B, you should be screened for this virus. Talk with your health care provider to find out if you are at risk for hepatitis B infection. Hepatitis C Testing is recommended for: Everyone born from [birth year] through 1963. Anyone with known risk factors for hepatitis C. Sexually transmitted infections (STIs) Get screened for STIs, including gonorrhea and chlamydia, if: You are sexually active and are younger than 87 years of age. You are older than 87 years of age and your health care provider tells you that you are at risk for this type of infection. Your sexual activity has changed since you were last screened, and you are at  increased risk for chlamydia or gonorrhea. Ask your health care provider if you are at risk. Ask your health care provider about whether you are at high risk for HIV. Your health care provider may recommend a prescription medicine to help prevent HIV infection. If you choose to take medicine to prevent HIV, you should first get tested for HIV. You should then be tested every 3 months for as long as you  are taking the medicine. Pregnancy If you are about to stop having your period (premenopausal) and you may become pregnant, seek counseling before you get pregnant. Take 400 to 800 micrograms (mcg) of folic acid every day if you become pregnant. Ask for birth control (contraception) if you want to prevent pregnancy. Osteoporosis and menopause Osteoporosis is a disease in which the bones lose minerals and strength with aging. This can result in bone fractures. If you are 62 years old or older, or if you are at risk for osteoporosis and fractures, ask your health care provider if you should: Be screened for bone loss. Take a calcium or vitamin D supplement to lower your risk of fractures. Be given hormone replacement therapy (HRT) to treat symptoms of menopause. Follow these instructions at home: Alcohol use Do not drink alcohol if: Your health care provider tells you not to drink. You are pregnant, may be pregnant, or are planning to become pregnant. If you drink alcohol: Limit how much you have to: 0-1 drink a day. Know how much alcohol is in your drink. In the [place] one drink equals one 12 oz bottle of beer (355 mL), one 5 oz glass of wine (148 mL), or one 1 oz glass of hard liquor (44 mL). Lifestyle Do not use any products that contain nicotine or tobacco. These products include cigarettes, chewing tobacco, and vaping devices, such as e-cigarettes. If you need help quitting, ask your health care provider. Do not use street drugs. Do not share needles. Ask your health care provider for help if you need support or information about quitting drugs. General instructions Schedule regular health, dental, and eye exams. Stay current with your vaccines. Tell your health care provider if: You often feel depressed. You have ever been abused or do not feel safe at home. Summary Adopting a healthy lifestyle and getting preventive care are important in promoting health and wellness. Follow your  health care provider's instructions about healthy diet, exercising, and getting tested or screened for diseases. Follow your health care provider's instructions on monitoring your cholesterol and blood pressure. This information is not intended to replace advice given to you by your health care provider. Make sure you discuss any questions you have with your health care provider. Document Revised: 03/07/2021 Document Reviewed: 03/07/2021 Elsevier Patient Education  2024 [employer].

## 2023-06-19 ENCOUNTER — Ambulatory Visit (INDEPENDENT_AMBULATORY_CARE_PROVIDER_SITE_OTHER): Payer: Medicare Other | Admitting: Internal Medicine

## 2023-06-19 VITALS — BP 116/64 | HR 84 | Temp 98.0°F | Ht 64.0 in | Wt 142.0 lb

## 2023-06-19 DIAGNOSIS — I4819 Other persistent atrial fibrillation: Secondary | ICD-10-CM | POA: Diagnosis not present

## 2023-06-19 DIAGNOSIS — R42 Dizziness and giddiness: Secondary | ICD-10-CM

## 2023-06-19 DIAGNOSIS — E782 Mixed hyperlipidemia: Secondary | ICD-10-CM

## 2023-06-19 DIAGNOSIS — M159 Polyosteoarthritis, unspecified: Secondary | ICD-10-CM

## 2023-06-19 DIAGNOSIS — I10 Essential (primary) hypertension: Secondary | ICD-10-CM

## 2023-06-19 DIAGNOSIS — M1990 Unspecified osteoarthritis, unspecified site: Secondary | ICD-10-CM | POA: Insufficient documentation

## 2023-06-19 DIAGNOSIS — M810 Age-related osteoporosis without current pathological fracture: Secondary | ICD-10-CM

## 2023-06-19 DIAGNOSIS — Z0000 Encounter for general adult medical examination without abnormal findings: Secondary | ICD-10-CM | POA: Diagnosis not present

## 2023-06-19 DIAGNOSIS — F5101 Primary insomnia: Secondary | ICD-10-CM

## 2023-06-19 DIAGNOSIS — R600 Localized edema: Secondary | ICD-10-CM

## 2023-06-19 NOTE — Assessment & Plan Note (Addendum)
Chronic Controlled Advised taking the medication only as needed and risk of memory issues, increased risk of falls Continue clonazepam 0.25 mg HS prn-she does not take this frequently and will continue to use it only on occasion

## 2023-06-19 NOTE — Assessment & Plan Note (Signed)
Chronic Has osteoarthritis of multiple joints It does wake her up at times in the middle of the night-currently taking 1000 mg of Tylenol in the evening and she can continue this-can increase up to 3000 mg in 24-hour.-Can take Tylenol melanite when she wakes up if needed or try the extended release Tylenol to see if that lasts longer throughout the night

## 2023-06-19 NOTE — Assessment & Plan Note (Addendum)
Chronic Deferred dexa Discussed increased risk of falling or breaking something without falling Discussed Fosamax, Reclast and Prolia as choices for treatment and she will consider Fall prevention discussed

## 2023-06-19 NOTE — Assessment & Plan Note (Signed)
Chronic Regular exercise and healthy diet encouraged Check lipid panel  Continue lifestyle controlled

## 2023-06-19 NOTE — Assessment & Plan Note (Addendum)
A chronic Controlled Continue furosemide 20 mg daily as needed Continue elevating legs when sitting

## 2023-06-19 NOTE — Assessment & Plan Note (Signed)
Subacute He is experiencing transient episodes of lightheadedness, nausea that remind her of her vertigo Not always related to head movements, changes in position so does not seem to be BPPV Discussed possible dehydration which she states she does not drink that much water-encouraged increasing water intake Meclizine does help so okay to continue as needed She has not had any episodes in the past several days and hopefully will not return

## 2023-06-19 NOTE — Assessment & Plan Note (Signed)
Chronic BP well controlled CBC, CMP Continue metoprolol 25 mg daily

## 2023-06-19 NOTE — Assessment & Plan Note (Addendum)
Chronic cardioversion multiple times in the past In permanent A-fib Management per cardio On metoprolol, eliquis CBC, CMP, TSH

## 2023-06-20 LAB — COMPREHENSIVE METABOLIC PANEL
ALT: 18 U/L (ref 0–35)
AST: 26 U/L (ref 0–37)
Albumin: 3.8 g/dL (ref 3.5–5.2)
Alkaline Phosphatase: 69 U/L (ref 39–117)
BUN: 14 mg/dL (ref 6–23)
CO2: 29 mEq/L (ref 19–32)
Calcium: 9.1 mg/dL (ref 8.4–10.5)
Chloride: 105 meq/L (ref 96–112)
Creatinine, Ser: 0.71 mg/dL (ref 0.40–1.20)
GFR: 75.35 mL/min (ref >60.00)
Glucose, Bld: 101 mg/dL — ABNORMAL HIGH (ref 70–99)
Potassium: 4.4 mEq/L (ref 3.5–5.1)
Sodium: 139 mEq/L (ref 135–145)
Total Bilirubin: 1.9 mg/dL — ABNORMAL HIGH (ref 0.2–1.2)
Total Protein: 6.6 g/dL (ref 6.0–8.3)

## 2023-06-20 LAB — CBC WITH DIFFERENTIAL/PLATELET
Basophils Absolute: 0 10*3/uL (ref 0.0–0.1)
Basophils Relative: 0.5 % (ref 0.0–3.0)
Eosinophils Absolute: 0.1 10*3/uL (ref 0.0–0.7)
Eosinophils Relative: 1.2 % (ref 0.0–5.0)
HCT: 41.3 % (ref 36.0–46.0)
Hemoglobin: 13.3 g/dL (ref 12.0–15.0)
Lymphocytes Relative: 18.4 % (ref 12.0–46.0)
Lymphs Abs: 1.4 10*3/uL (ref 0.7–4.0)
MCHC: 32.2 g/dL (ref 30.0–36.0)
MCV: 93.8 fl (ref 78.0–100.0)
Monocytes Absolute: 0.7 10*3/uL (ref 0.1–1.0)
Monocytes Relative: 9.7 % (ref 3.0–12.0)
Neutro Abs: 5.3 10*3/uL (ref 1.4–7.7)
Neutrophils Relative %: 70.2 % (ref 43.0–77.0)
Platelets: 170 10*3/uL (ref 150.0–400.0)
RBC: 4.4 Mil/uL (ref 3.87–5.11)
RDW: 14.7 % (ref 11.5–15.5)
WBC: 7.5 10*3/uL (ref 4.0–10.5)

## 2023-06-20 LAB — LIPID PANEL
Cholesterol: 159 mg/dL (ref 0–200)
HDL: 53.6 mg/dL (ref >39.00)
LDL Cholesterol: 92 mg/dL (ref 0–99)
NonHDL: 105.49
Total CHOL/HDL Ratio: 3
Triglycerides: 69 mg/dL (ref 0.0–149.0)
VLDL: 13.8 mg/dL (ref 0.0–40.0)

## 2023-06-20 LAB — HEMOGLOBIN A1C: Hgb A1c MFr Bld: 6.1 % (ref 4.6–6.5)

## 2023-06-22 LAB — VITAMIN D 25 HYDROXY (VIT D DEFICIENCY, FRACTURES): VITD: 50.6 ng/mL (ref 30.00–100.00)

## 2023-06-22 LAB — TSH: TSH: 2.26 u[IU]/mL (ref 0.35–5.50)

## 2023-07-20 ENCOUNTER — Encounter: Payer: Self-pay | Admitting: Internal Medicine

## 2023-07-23 ENCOUNTER — Ambulatory Visit (INDEPENDENT_AMBULATORY_CARE_PROVIDER_SITE_OTHER): Payer: Medicare Other | Admitting: Family Medicine

## 2023-07-23 ENCOUNTER — Encounter: Payer: Self-pay | Admitting: Family Medicine

## 2023-07-23 VITALS — BP 136/78 | HR 95 | Temp 97.5°F | Ht 64.0 in | Wt 142.0 lb

## 2023-07-23 DIAGNOSIS — M549 Dorsalgia, unspecified: Secondary | ICD-10-CM | POA: Diagnosis not present

## 2023-07-23 MED ORDER — METHOCARBAMOL 500 MG PO TABS: 500.0000 mg | ORAL_TABLET | Freq: Three times a day (TID) | ORAL | 0 refills | Status: DC | PRN

## 2023-07-23 MED ORDER — HYDROCODONE-ACETAMINOPHEN 5-325 MG PO TABS: 1.0000 | ORAL_TABLET | Freq: Three times a day (TID) | ORAL | 0 refills | Status: DC | PRN

## 2023-07-23 NOTE — Progress Notes (Signed)
Assessment & Plan:  1. Musculoskeletal back pain Discussed with patient we are unable to use steroids to treat her pain due to osteoporosis and previous fractures.  She may continue lidocaine patches, heating pad, Tylenol 1,000 mg and ibuprofen 400 mg every 6-8 hours.  Advised she may increase her ibuprofen to 600 mg.  I have prescribed both Robaxin and Norco.  We agreed she would try the Robaxin first.  She understands not to take the two medications together.  She also understands that if she takes Norco she needs to stop taking Tylenol.  Biofreeze is another option.  Education provided on muscle pain.  Recommended that she stop doing the heavy lifting and let her family members take care of that. - methocarbamol (ROBAXIN) 500 MG tablet; Take 1 tablet (500 mg total) by mouth every 8 (eight) hours as needed.  Dispense: 30 tablet; Refill: 0 - HYDROcodone-acetaminophen (NORCO) 5-325 MG tablet; Take 1 tablet by mouth every 8 (eight) hours as needed for up to 5 days for moderate pain.  Dispense: 15 tablet; Refill: 0   Follow up plan: Return if symptoms worsen or fail to improve.  Deliah Boston, MSN, APRN, FNP-C  Subjective:  HPI: Molly Tucker is a 89 y.o. female presenting on 07/23/2023 for Back Pain (Mid back pain - across x 1 week. She is moving and has been lifting things )  Patient reports mid back pain all the way across x 1 week.  She has been lifting things as she is in the process of moving.  She has been taking Tylenol 1,000 mg with ibuprofen 400 mg every 5-6 hours.  She did find an old prescription of tramadol which she tried at nighttime.  She has been applying lidocaine patches, heat, and ice.  All of these things helped some, but not nearly enough for her.    ROS: Negative unless specifically indicated above in HPI.   Relevant past medical history reviewed and updated as indicated.   Allergies and medications reviewed and updated.   Current Outpatient Medications:     acetaminophen (TYLENOL) 500 MG tablet, Take 500 mg by mouth every 6 (six) hours as needed for moderate pain., Disp: , Rfl:    anastrozole (ARIMIDEX) 1 MG tablet, Take 1 tablet (1 mg total) by mouth daily., Disp: 90 tablet, Rfl: 3   Cholecalciferol (VITAMIN D) 50 MCG (2000 UT) tablet, Take 2,000 Units by mouth every morning., Disp: , Rfl:    ELIQUIS 5 MG TABS tablet, TAKE 1 TABLET BY MOUTH TWICE  DAILY, Disp: 180 tablet, Rfl: 3   metoprolol succinate (TOPROL-XL) 25 MG 24 hr tablet, TAKE 1 TABLET BY MOUTH ONCE  DAILY, Disp: 90 tablet, Rfl: 0   clonazePAM (KLONOPIN) 0.5 MG tablet, TAKE 1/2 TO 1 TABLET BY MOUTH AT BEDTIME AS NEEDED FOR ANXIETY. (Patient not taking: Reported on 07/23/2023), Disp: 30 tablet, Rfl: 0  No Known Allergies  Objective:   BP 136/78   Pulse 95   Temp (!) 97.5 F (36.4 C)   Ht 5\' 4"  (1.626 m)   Wt 142 lb (64.4 kg)   SpO2 99%   BMI 24.37 kg/m    Physical Exam Vitals reviewed.  Constitutional:      General: She is not in acute distress.    Appearance: Normal appearance. She is not ill-appearing, toxic-appearing or diaphoretic.  HENT:     Head: Normocephalic and atraumatic.  Eyes:     General: No scleral icterus.  Right eye: No discharge.        Left eye: No discharge.     Conjunctiva/sclera: Conjunctivae normal.  Cardiovascular:     Rate and Rhythm: Normal rate and regular rhythm.     Heart sounds: Normal heart sounds. No murmur heard.    No friction rub. No gallop.  Pulmonary:     Effort: Pulmonary effort is normal. No respiratory distress.     Breath sounds: Normal breath sounds. No stridor. No wheezing, rhonchi or rales.  Musculoskeletal:        General: Normal range of motion.     Cervical back: Normal range of motion.     Thoracic back: Tenderness (muscular) present. No bony tenderness.  Skin:    General: Skin is warm and dry.     Capillary Refill: Capillary refill takes less than 2 seconds.  Neurological:     General: No focal deficit  present.     Mental Status: She is alert and oriented to person, place, and time. Mental status is at baseline.  Psychiatric:        Mood and Affect: Mood normal.        Behavior: Behavior normal.        Thought Content: Thought content normal.        Judgment: Judgment normal.

## 2023-07-23 NOTE — Patient Instructions (Signed)
Continue Tylenol 1,000 mg and Ibuprofen 400 mg every 6-8 hours. You could also increase your Ibuprofen to 600 mg.  Methocarbamol (Robaxin) is a muscle relaxer.   Norco (hydrocodone-acetaminophen) is the pain pill.   Try the Robaxin before the Norco. Do not take these two medications together.  If you take the Norco, stop taking the Tylenol.   May also continue Lidocaine patches and heating pad.   A muscle rub such as Biofreeze is also okay.

## 2023-07-27 ENCOUNTER — Telehealth: Payer: Self-pay | Admitting: Internal Medicine

## 2023-07-27 DIAGNOSIS — M549 Dorsalgia, unspecified: Secondary | ICD-10-CM

## 2023-07-27 MED ORDER — HYDROCODONE-ACETAMINOPHEN 5-325 MG PO TABS: 1.0000 | ORAL_TABLET | Freq: Three times a day (TID) | ORAL | 0 refills | Status: DC | PRN

## 2023-07-27 NOTE — Telephone Encounter (Signed)
Prescription Request  07/27/2023  LOV: 06/19/2023  What is the name of the medication or equipment?   HYDROcodone-acetaminophen (NORCO) 5-325 MG tablet    Have you contacted your pharmacy to request a refill? No   Which pharmacy would you like this sent to?    [id number] [place], [place] - [address] AT CORNER OF [facility] [address] [place] [address] Phone: [phone] Fax: [phone]   Patient notified that their request is being sent to the clinical staff for review and that they should receive a response within 2 business days.   Please advise at Mobile [phone] (mobile)   Pt called sating she only received 15 pills  and she only has 3 pills left  and she is scared she will be without and in pain.

## 2023-08-06 ENCOUNTER — Encounter: Payer: Self-pay | Admitting: Internal Medicine

## 2023-08-06 DIAGNOSIS — M549 Dorsalgia, unspecified: Secondary | ICD-10-CM

## 2023-08-06 NOTE — Telephone Encounter (Signed)
Patient called to check on the status of her request. She would like a call back at [phone].

## 2023-08-07 MED ORDER — HYDROCODONE-ACETAMINOPHEN 5-325 MG PO TABS: 1.0000 | ORAL_TABLET | Freq: Three times a day (TID) | ORAL | 0 refills | Status: DC | PRN

## 2023-08-08 ENCOUNTER — Ambulatory Visit (INDEPENDENT_AMBULATORY_CARE_PROVIDER_SITE_OTHER): Payer: Medicare Other

## 2023-08-08 DIAGNOSIS — M549 Dorsalgia, unspecified: Secondary | ICD-10-CM

## 2023-08-08 NOTE — Addendum Note (Signed)
Addended by: Pincus Sanes on: 08/08/2023 01:06 PM   Modules accepted: Orders

## 2023-08-09 DIAGNOSIS — S22000A Wedge compression fracture of unspecified thoracic vertebra, initial encounter for closed fracture: Secondary | ICD-10-CM | POA: Insufficient documentation

## 2023-08-09 NOTE — Progress Notes (Signed)
Subjective:    Patient ID: Molly Tucker, female    DOB: 01-Feb-1932, 89 y.o.   MRN: [id number]      HPI Molly Tucker is here for No chief complaint on file.    Back pain - seen 9/23 for back pain x 1 week. Started after lifting things.  She was given rx for norco and methocarbamol.  Her pain did not improve.  Xrays 2 days ago showed 2 new compression fractures. Age-indeterminate severe compression at T6, new mild compression at T12.  Old moderate compression at T9    9 am - takes norco, 2 tylenol 3 advil - lasts 3-4 hours - may take additional advil and tylenol in the afternoon Takes norco in evening  Laying down on her back helps the pain.  She sleep well most of the time but she takes a sleeping pill every night.  It helps her sleep.    Has some mild constipation and nausea.     Before fracture had cough in morning and night - had phlegm - it was clear.   It stopped and a week or so ago once in a while she would cough but it is clearing up.    Medications and allergies reviewed with patient and updated if appropriate.  Current Outpatient Medications on File Prior to Visit  Medication Sig Dispense Refill   acetaminophen (TYLENOL) 500 MG tablet Take 500 mg by mouth every 6 (six) hours as needed for moderate pain.     anastrozole (ARIMIDEX) 1 MG tablet Take 1 tablet (1 mg total) by mouth daily. 90 tablet 3   Cholecalciferol (VITAMIN D) 50 MCG (2000 UT) tablet Take 2,000 Units by mouth every morning.     clonazePAM (KLONOPIN) 0.5 MG tablet TAKE 1/2 TO 1 TABLET BY MOUTH AT BEDTIME AS NEEDED FOR ANXIETY. (Patient not taking: Reported on 07/23/2023) 30 tablet 0   ELIQUIS 5 MG TABS tablet TAKE 1 TABLET BY MOUTH TWICE  DAILY 180 tablet 3   HYDROcodone-acetaminophen (NORCO) 5-325 MG tablet Take 1 tablet by mouth every 8 (eight) hours as needed for up to 5 days for moderate pain. 15 tablet 0   methocarbamol (ROBAXIN) 500 MG tablet Take 1 tablet (500 mg total) by mouth every 8 (eight)  hours as needed. 30 tablet 0   metoprolol succinate (TOPROL-XL) 25 MG 24 hr tablet TAKE 1 TABLET BY MOUTH ONCE  DAILY 90 tablet 0   No current facility-administered medications on file prior to visit.    Review of Systems     Objective:  There were no vitals filed for this visit. BP Readings from Last 3 Encounters:  07/23/23 136/78  06/19/23 116/64  09/28/22 (!) 148/90   Wt Readings from Last 3 Encounters:  07/23/23 142 lb (64.4 kg)  06/19/23 142 lb (64.4 kg)  06/13/23 140 lb (63.5 kg)   There is no height or weight on file to calculate BMI.    Physical Exam Constitutional:      General: She is not in acute distress.    Appearance: Normal appearance. She is not ill-appearing.  HENT:     Head: Normocephalic and atraumatic.  Musculoskeletal:        General: Tenderness (right right of mid thoracic spine - no obvious spine tenderness) present.     Right lower leg: No edema.     Left lower leg: No edema.  Skin:    General: Skin is warm and dry.  Neurological:     Mental  Status: She is alert.          DG Thoracic Spine 2 View CLINICAL DATA:  Back pain  EXAM: LUMBAR SPINE - COMPLETE 4 VIEW; THORACIC SPINE 2 VIEWS  COMPARISON:  Chest radiograph dated February 22, 2015  FINDINGS: Thoracic: Age-indeterminate severe compression fracture of T6. Moderate compression fracture of T9, unchanged when compared with the prior. New mild compression fracture of T12. Alignment is normal. Moderate multilevel degenerative disc disease. Cardiomegaly. New triangular opacity of the right lung base. Trace bilateral pleural effusions.  Lumbar: Mild age-indeterminate compression fracture of L3. Mild dextrocurvature of the lumbar spine with rotatory component. Moderate multilevel facet arthropathy, most pronounced in the lower lumbar spine. Multilevel moderate degenerative disc disease. Aortoiliac vascular calcifications.  IMPRESSION: 1. Multilevel age-indeterminate compression  fractures of the thoracic and lumbar spine, a severe compression fracture is seen at the T6 level. Consider cross-sectional imaging for further evaluation. 2. New triangular opacity of the right lung base, concerning for atelectasis or infection. Recommend PA and lateral chest radiograph for further evaluation. 3. Cardiomegaly and trace bilateral pleural effusions.  Electronically Signed   By: Allegra Lai M.D.   On: 08/08/2023 17:22 DG Lumbar Spine Complete CLINICAL DATA:  Back pain  EXAM: LUMBAR SPINE - COMPLETE 4 VIEW; THORACIC SPINE 2 VIEWS  COMPARISON:  Chest radiograph dated February 22, 2015  FINDINGS: Thoracic: Age-indeterminate severe compression fracture of T6. Moderate compression fracture of T9, unchanged when compared with the prior. New mild compression fracture of T12. Alignment is normal. Moderate multilevel degenerative disc disease. Cardiomegaly. New triangular opacity of the right lung base. Trace bilateral pleural effusions.  Lumbar: Mild age-indeterminate compression fracture of L3. Mild dextrocurvature of the lumbar spine with rotatory component. Moderate multilevel facet arthropathy, most pronounced in the lower lumbar spine. Multilevel moderate degenerative disc disease. Aortoiliac vascular calcifications.  IMPRESSION: 1. Multilevel age-indeterminate compression fractures of the thoracic and lumbar spine, a severe compression fracture is seen at the T6 level. Consider cross-sectional imaging for further evaluation. 2. New triangular opacity of the right lung base, concerning for atelectasis or infection. Recommend PA and lateral chest radiograph for further evaluation. 3. Cardiomegaly and trace bilateral pleural effusions.  Electronically Signed   By: Allegra Lai M.D.   On: 08/08/2023 17:22    Assessment & Plan:    See Problem List for Assessment and Plan of chronic medical problems.

## 2023-08-09 NOTE — Assessment & Plan Note (Addendum)
New compression fx at T6 (severe) and T12 Old compression fx at T9 Has known osteoporosis Pain is fairly controlled with Norco 5-325 mg twice daily in addition to 3 Tylenol and 2 ibuprofen twice a day Discussed limits of Tylenol Can continue ibuprofen-renal function normal and not having any stomach issues, but discussed what to monitor for Referral to neurosurgery-she is not sure if she wants to have the kyphoplasty done or not, but I think she needs to discuss this with them Discussed considering medications for osteoporosis-we do not want her to have any further fractures-she is not sure about this but will think about it

## 2023-08-09 NOTE — Patient Instructions (Addendum)
        Medications changes include :   none    A referral was ordered for neurosurgery and someone will call you to schedule an appointment.     Return for follow up as scheduled.

## 2023-08-10 ENCOUNTER — Encounter: Payer: Self-pay | Admitting: Internal Medicine

## 2023-08-10 ENCOUNTER — Ambulatory Visit: Payer: Medicare Other | Admitting: Internal Medicine

## 2023-08-10 VITALS — BP 136/70 | HR 90 | Temp 98.2°F | Ht 64.0 in | Wt 143.0 lb

## 2023-08-10 DIAGNOSIS — S22000A Wedge compression fracture of unspecified thoracic vertebra, initial encounter for closed fracture: Secondary | ICD-10-CM

## 2023-08-13 ENCOUNTER — Telehealth: Payer: Self-pay | Admitting: Internal Medicine

## 2023-08-13 DIAGNOSIS — M549 Dorsalgia, unspecified: Secondary | ICD-10-CM

## 2023-08-13 NOTE — Telephone Encounter (Signed)
Prescription Request  08/13/2023  LOV: 08/10/2023  What is the name of the medication or equipment? HYDROcodone-acetaminophen (NORCO) 5-325 MG tablet   Have you contacted your pharmacy to request a refill? No   Which pharmacy would you like this sent to?  [id number] [place], [place] - [address] AT [address] OF [facility] [address] [place] [address] Phone: [phone] Fax: [phone]    Patient notified that their request is being sent to the clinical staff for review and that they should receive a response within 2 business days.   Please advise at Mobile [phone] (mobile)

## 2023-08-14 MED ORDER — HYDROCODONE-ACETAMINOPHEN 5-325 MG PO TABS: 1.0000 | ORAL_TABLET | Freq: Three times a day (TID) | ORAL | 0 refills | Status: DC | PRN

## 2023-08-14 NOTE — Telephone Encounter (Signed)
Patient called back about her refill request. She said she is completely out and she is in pain. Best callback is [phone].

## 2023-08-14 NOTE — Telephone Encounter (Signed)
sent 

## 2023-08-14 NOTE — Telephone Encounter (Signed)
[facility] and the pharmacy has to order it. Pt want to know can Dr. Lawerance Bach send a couple of pills some where else until she get her medication. Pt is really frustrated. Please advise.

## 2023-08-14 NOTE — Progress Notes (Deleted)
Referring Physician:  Pincus Sanes, MD [address] [place],  [place] [address]  Primary Physician:  Pincus Sanes, MD  History of Present Illness: 08/14/2023*** Molly Tucker has a history of ***  Duration: *** Location: *** Quality: *** Severity: ***  Precipitating: aggravated by *** Modifying factors: made better by *** Weakness: none Timing: *** Bowel/Bladder Dysfunction: none  Conservative measures:  Physical therapy: ***  Multimodal medical therapy including regular antiinflammatories: ***  Injections: *** epidural steroid injections  Past Surgery: ***  Molly Tucker has ***no symptoms of cervical myelopathy.  The symptoms are causing a significant impact on the patient's life.   Review of Systems:  A 10 point review of systems is negative, except for the pertinent positives and negatives detailed in the HPI.  Past Medical History: Past Medical History:  Diagnosis Date   Arthritis    Atrial tachycardia (HCC)    Breast cancer (HCC) 11/30/2020   Carotid bruit 2005   right, carotid doppler: <39% occlusion   Complication of anesthesia    Dysrhythmia    Chronic A. Fib   Family history of breast cancer    Family history of GI tract cancer    Family history of stomach cancer    Hyperlipidemia    LDL goal =<120   Hyperplastic colonic polyp 2006   Dr. Leone Payor, due 2016   Hypertension    Osteoporosis    S/P  biphosphonates x 5 yrs   Persistent atrial fibrillation (HCC)    PONV (postoperative nausea and vomiting)     Past Surgical History: Past Surgical History:  Procedure Laterality Date   BREAST LUMPECTOMY     BREAST LUMPECTOMY WITH SENTINEL LYMPH NODE BIOPSY Left 12/23/2020   Procedure: LEFT BREAST LUMPECTOMY WITH SENTINEL LYMPH NODE BX;  Surgeon: Almond Lint, MD;  Location: [facility] OR;  Service: General;  Laterality: Left;  RNFA, PEC BLOCK; START TIME OF 1030 rm 2 per ivey   CARDIOVERSION N/A 03/31/2015   Procedure: CARDIOVERSION;   Surgeon: Thurmon Fair, MD;  Location: [facility] ENDOSCOPY;  Service: Cardiovascular;  Laterality: N/A;   CARDIOVERSION N/A 12/21/2015   Procedure: CARDIOVERSION;  Surgeon: Lewayne Bunting, MD;  Location: [facility] ENDOSCOPY;  Service: Cardiovascular;  Laterality: N/A;   CARDIOVERSION N/A 01/09/2017   Procedure: CARDIOVERSION;  Surgeon: Jake Bathe, MD;  Location: [facility] ENDOSCOPY;  Service: Cardiovascular;  Laterality: N/A;   CARDIOVERSION N/A 07/09/2019   Procedure: CARDIOVERSION;  Surgeon: Thurmon Fair, MD;  Location: [facility] ENDOSCOPY;  Service: Cardiovascular;  Laterality: N/A;   CARDIOVERSION N/A 01/14/2020   Procedure: CARDIOVERSION;  Surgeon: Sande Rives, MD;  Location: [facility] ENDOSCOPY;  Service: Cardiovascular;  Laterality: N/A;   CARDIOVERSION N/A 02/23/2020   Procedure: CARDIOVERSION;  Surgeon: Jake Bathe, MD;  Location: [facility] ENDOSCOPY;  Service: Cardiovascular;  Laterality: N/A;   COLONOSCOPY W/ POLYPECTOMY  10/30/2004   DILATION AND CURETTAGE OF UTERUS     MOUTH SURGERY  2009-2010   implants, Dr. Dereck Leep    Allergies: Allergies as of 08/21/2023   (No Known Allergies)    Medications: Outpatient Encounter Medications as of 08/21/2023  Medication Sig   acetaminophen (TYLENOL) 500 MG tablet Take 500 mg by mouth every 6 (six) hours as needed for moderate pain.   anastrozole (ARIMIDEX) 1 MG tablet Take 1 tablet (1 mg total) by mouth daily.   Cholecalciferol (VITAMIN D) 50 MCG (2000 UT) tablet Take 2,000 Units by mouth every morning.   ELIQUIS 5 MG TABS tablet TAKE  1 TABLET BY MOUTH TWICE  DAILY   HYDROcodone-acetaminophen (NORCO) 5-325 MG tablet Take 1 tablet by mouth every 8 (eight) hours as needed (chronic back pain).   metoprolol succinate (TOPROL-XL) 25 MG 24 hr tablet TAKE 1 TABLET BY MOUTH ONCE  DAILY   No facility-administered encounter medications on file as of 08/21/2023.    Social History: Social History   Tobacco Use   Smoking status: Former    Current packs/day:  0.00    Types: Cigarettes    Quit date: 10/30/1976    Years since quitting: 46.8   Smokeless tobacco: Never   Tobacco comments:    Patient would ONLY smoke occasionally   Vaping Use   Vaping status: Never Used  Substance Use Topics   Alcohol use: No   Drug use: No    Family Medical History: Family History  Problem Relation Age of Onset   Coronary artery disease Father        MI in 91s   Coronary artery disease Sister        stent 2009   Breast cancer Sister 44       bilateral, diagnosed in other breast at age 61   Cancer Paternal Aunt        colon, possible breast cancer, dx 50s/60s   Arthritis Other        aunts   Stomach cancer Maternal Uncle        dx 37s   Intellectual disability Paternal Uncle    Cancer Paternal Aunt        gastrointestinal, dx 50s/60s   Stroke Neg Hx     Physical Examination: There were no vitals filed for this visit.  General: Patient is well developed, well nourished, calm, collected, and in no apparent distress. Attention to examination is appropriate.  Respiratory: Patient is breathing without any difficulty.   NEUROLOGICAL:     Awake, alert, oriented to person, place, and time.  Speech is clear and fluent. Fund of knowledge is appropriate.   Cranial Nerves: Pupils equal round and reactive to light.  Facial tone is symmetric.    *** ROM of cervical spine *** pain *** posterior cervical tenderness. *** tenderness in bilateral trapezial region.   *** ROM of lumbar spine *** pain *** posterior lumbar tenderness.   No abnormal lesions on exposed skin.   Strength: Side Biceps Triceps Deltoid Interossei Grip Wrist Ext. Wrist Flex.  R 5 5 5 5 5 5 5   L 5 5 5 5 5 5 5    Side Iliopsoas Quads Hamstring PF DF EHL  R 5 5 5 5 5 5   L 5 5 5 5 5 5    Reflexes are ***2+ and symmetric at the biceps, brachioradialis, patella and achilles.   Hoffman's is absent.  Clonus is not present.   Bilateral upper and lower extremity sensation is intact to  light touch.     Gait is normal.   ***No difficulty with tandem gait.    Medical Decision Making  Imaging: ***  I have personally reviewed the images and agree with the above interpretation.  Assessment and Plan: Molly Tucker is a pleasant 89 y.o. female has ***  Treatment options discussed with patient and following plan made:   - Order for physical therapy for *** spine ***. Patient to call to schedule appointment. *** - Continue current medications including ***. Reviewed dosing and side effects.  - Prescription for ***. Reviewed dosing and side effects. Take with food.  - Prescription  for *** to take prn muscle spasms. Reviewed dosing and side effects. Discussed this can cause drowsiness.  - MRI of *** to further evaluate *** radiculopathy. No improvement time or medications (***).  - Referral to PMR at [facility] to discuss possible *** injections.  - Will schedule phone visit to review MRI results once I get them back.   I spent a total of *** minutes in face-to-face and non-face-to-face activities related to this patient's care today including review of outside records, review of imaging, review of symptoms, physical exam, discussion of differential diagnosis, discussion of treatment options, and documentation.   Thank you for involving me in the care of this patient.   Drake Leach PA-C Dept. of Neurosurgery

## 2023-08-15 ENCOUNTER — Telehealth: Payer: Self-pay

## 2023-08-15 ENCOUNTER — Other Ambulatory Visit: Payer: Self-pay | Admitting: Internal Medicine

## 2023-08-15 DIAGNOSIS — M549 Dorsalgia, unspecified: Secondary | ICD-10-CM

## 2023-08-15 MED ORDER — HYDROCODONE-ACETAMINOPHEN 5-325 MG PO TABS: 1.0000 | ORAL_TABLET | Freq: Three times a day (TID) | ORAL | 0 refills | Status: DC | PRN

## 2023-08-15 NOTE — Telephone Encounter (Signed)
Called and left message for patient today.  [facility] [address] [phone].  [facility].  If she calls back please let me know where she wants to pick up her pain medication so we can get it sent in for her today.

## 2023-08-15 NOTE — Telephone Encounter (Signed)
Sent earlier

## 2023-08-15 NOTE — Telephone Encounter (Signed)
[facility] [address] [facility].

## 2023-08-17 ENCOUNTER — Inpatient Hospital Stay: Payer: Medicare Other | Admitting: Hematology and Oncology

## 2023-08-17 ENCOUNTER — Ambulatory Visit: Payer: Medicare Other | Admitting: Hematology and Oncology

## 2023-08-21 ENCOUNTER — Ambulatory Visit: Payer: Medicare Other | Admitting: Orthopedic Surgery

## 2023-08-22 ENCOUNTER — Encounter: Payer: Self-pay | Admitting: Internal Medicine

## 2023-08-27 ENCOUNTER — Other Ambulatory Visit: Payer: Self-pay | Admitting: Internal Medicine

## 2023-09-04 ENCOUNTER — Emergency Department (HOSPITAL_COMMUNITY): Payer: Medicare Other

## 2023-09-04 ENCOUNTER — Other Ambulatory Visit: Payer: Self-pay

## 2023-09-04 ENCOUNTER — Inpatient Hospital Stay (HOSPITAL_COMMUNITY)
Admission: EM | Admit: 2023-09-04 | Discharge: 2023-09-06 | DRG: 291 | Disposition: A | Discharge status: Home / Self Care | Payer: Medicare Other | Source: Ambulatory Visit | Attending: Internal Medicine | Admitting: Internal Medicine

## 2023-09-04 ENCOUNTER — Encounter (HOSPITAL_COMMUNITY): Payer: Self-pay

## 2023-09-04 DIAGNOSIS — Z853 Personal history of malignant neoplasm of breast: Secondary | ICD-10-CM | POA: Diagnosis not present

## 2023-09-04 DIAGNOSIS — I110 Hypertensive heart disease with heart failure: Principal | ICD-10-CM | POA: Diagnosis present

## 2023-09-04 DIAGNOSIS — Z810 Family history of intellectual disabilities: Secondary | ICD-10-CM | POA: Diagnosis not present

## 2023-09-04 DIAGNOSIS — D649 Anemia, unspecified: Secondary | ICD-10-CM | POA: Diagnosis present

## 2023-09-04 DIAGNOSIS — X58XXXA Exposure to other specified factors, initial encounter: Secondary | ICD-10-CM | POA: Diagnosis present

## 2023-09-04 DIAGNOSIS — M4854XA Collapsed vertebra, not elsewhere classified, thoracic region, initial encounter for fracture: Secondary | ICD-10-CM | POA: Diagnosis present

## 2023-09-04 DIAGNOSIS — I509 Heart failure, unspecified: Principal | ICD-10-CM

## 2023-09-04 DIAGNOSIS — Z7901 Long term (current) use of anticoagulants: Secondary | ICD-10-CM | POA: Diagnosis not present

## 2023-09-04 DIAGNOSIS — I5033 Acute on chronic diastolic (congestive) heart failure: Secondary | ICD-10-CM | POA: Diagnosis present

## 2023-09-04 DIAGNOSIS — I4821 Permanent atrial fibrillation: Secondary | ICD-10-CM | POA: Diagnosis present

## 2023-09-04 DIAGNOSIS — Z803 Family history of malignant neoplasm of breast: Secondary | ICD-10-CM

## 2023-09-04 DIAGNOSIS — I5030 Unspecified diastolic (congestive) heart failure: Secondary | ICD-10-CM | POA: Diagnosis not present

## 2023-09-04 DIAGNOSIS — Z800 Family history of malignant neoplasm of digestive organs: Secondary | ICD-10-CM | POA: Diagnosis not present

## 2023-09-04 DIAGNOSIS — H9190 Unspecified hearing loss, unspecified ear: Secondary | ICD-10-CM | POA: Diagnosis present

## 2023-09-04 DIAGNOSIS — Z79811 Long term (current) use of aromatase inhibitors: Secondary | ICD-10-CM | POA: Diagnosis not present

## 2023-09-04 DIAGNOSIS — I5031 Acute diastolic (congestive) heart failure: Secondary | ICD-10-CM | POA: Diagnosis not present

## 2023-09-04 DIAGNOSIS — Z860102 Personal history of hyperplastic colon polyps: Secondary | ICD-10-CM | POA: Diagnosis not present

## 2023-09-04 DIAGNOSIS — Z8249 Family history of ischemic heart disease and other diseases of the circulatory system: Secondary | ICD-10-CM

## 2023-09-04 DIAGNOSIS — R252 Cramp and spasm: Secondary | ICD-10-CM | POA: Diagnosis present

## 2023-09-04 DIAGNOSIS — Z1152 Encounter for screening for COVID-19: Secondary | ICD-10-CM | POA: Diagnosis not present

## 2023-09-04 DIAGNOSIS — E876 Hypokalemia: Secondary | ICD-10-CM | POA: Diagnosis present

## 2023-09-04 DIAGNOSIS — R54 Age-related physical debility: Secondary | ICD-10-CM | POA: Diagnosis present

## 2023-09-04 DIAGNOSIS — Z87891 Personal history of nicotine dependence: Secondary | ICD-10-CM

## 2023-09-04 DIAGNOSIS — Z79899 Other long term (current) drug therapy: Secondary | ICD-10-CM

## 2023-09-04 DIAGNOSIS — S22000A Wedge compression fracture of unspecified thoracic vertebra, initial encounter for closed fracture: Secondary | ICD-10-CM | POA: Diagnosis present

## 2023-09-04 DIAGNOSIS — E785 Hyperlipidemia, unspecified: Secondary | ICD-10-CM | POA: Diagnosis present

## 2023-09-04 LAB — CBC
HCT: 33.1 % — ABNORMAL LOW (ref 36.0–46.0)
HCT: 34.1 % — ABNORMAL LOW (ref 36.0–46.0)
Hemoglobin: 10.5 g/dL — ABNORMAL LOW (ref 12.0–15.0)
Hemoglobin: 11 g/dL — ABNORMAL LOW (ref 12.0–15.0)
MCH: 31.6 pg (ref 26.0–34.0)
MCH: 32.4 pg (ref 26.0–34.0)
MCHC: 31.7 g/dL (ref 30.0–36.0)
MCHC: 32.3 g/dL (ref 30.0–36.0)
MCV: 99.7 fL (ref 80.0–100.0)
MCV: 100.6 fL — ABNORMAL HIGH (ref 80.0–100.0)
Platelets: 274 10*3/uL (ref 150–400)
Platelets: 281 10*3/uL (ref 150–400)
RBC: 3.32 MIL/uL — ABNORMAL LOW (ref 3.87–5.11)
RBC: 3.39 MIL/uL — ABNORMAL LOW (ref 3.87–5.11)
RDW: 14.2 % (ref 11.5–15.5)
RDW: 14.3 % (ref 11.5–15.5)
WBC: 6 10*3/uL (ref 4.0–10.5)
WBC: 6.3 10*3/uL (ref 4.0–10.5)
nRBC: 0 % (ref 0.0–0.2)
nRBC: 0 % (ref 0.0–0.2)

## 2023-09-04 LAB — COMPREHENSIVE METABOLIC PANEL
ALT: 12 U/L (ref 0–44)
AST: 24 U/L (ref 15–41)
Albumin: 3.3 g/dL — ABNORMAL LOW (ref 3.5–5.0)
Alkaline Phosphatase: 59 U/L (ref 38–126)
Anion gap: 9 (ref 5–15)
BUN: 12 mg/dL (ref 8–23)
CO2: 29 mmol/L (ref 22–32)
Calcium: 8.9 mg/dL (ref 8.9–10.3)
Chloride: 100 mmol/L (ref 98–111)
Creatinine, Ser: 0.78 mg/dL (ref 0.44–1.00)
GFR, Estimated: >60 mL/min (ref >60)
Glucose, Bld: 103 mg/dL — ABNORMAL HIGH (ref 70–99)
Potassium: 2.9 mmol/L — ABNORMAL LOW (ref 3.5–5.1)
Sodium: 138 mmol/L (ref 135–145)
Total Bilirubin: 1 mg/dL (ref <1.2)
Total Protein: 6.2 g/dL — ABNORMAL LOW (ref 6.5–8.1)

## 2023-09-04 LAB — RESP PANEL BY RT-PCR (RSV, FLU A&B, COVID)  RVPGX2
Influenza A by PCR: NEGATIVE
Influenza B by PCR: NEGATIVE
Resp Syncytial Virus by PCR: NEGATIVE
SARS Coronavirus 2 by RT PCR: NEGATIVE

## 2023-09-04 LAB — CREATININE, SERUM
Creatinine, Ser: 0.82 mg/dL (ref 0.44–1.00)
GFR, Estimated: >60 mL/min (ref >60)

## 2023-09-04 LAB — BRAIN NATRIURETIC PEPTIDE: B Natriuretic Peptide: 521.9 pg/mL — ABNORMAL HIGH (ref 0.0–100.0)

## 2023-09-04 LAB — TROPONIN I (HIGH SENSITIVITY)
Troponin I (High Sensitivity): 19 ng/L — ABNORMAL HIGH (ref <18)
Troponin I (High Sensitivity): 24 ng/L — ABNORMAL HIGH (ref <18)

## 2023-09-04 LAB — MAGNESIUM: Magnesium: 1.8 mg/dL (ref 1.7–2.4)

## 2023-09-04 MED ORDER — ACETAMINOPHEN 650 MG RE SUPP: 650.0000 mg | Freq: Four times a day (QID) | RECTAL | Status: DC | PRN

## 2023-09-04 MED ORDER — POLYETHYLENE GLYCOL 3350 17 G PO PACK: 17.0000 g | PACK | Freq: Every day | ORAL | Status: DC | PRN

## 2023-09-04 MED ORDER — VITAMIN D 25 MCG (1000 UNIT) PO TABS
2000.0000 [IU] | ORAL_TABLET | Freq: Every morning | ORAL | Status: DC
  Administered 2023-09-05 – 2023-09-06 (×2): 2000 [IU] via ORAL
  Filled 2023-09-04 (×2): qty 2

## 2023-09-04 MED ORDER — FUROSEMIDE 10 MG/ML IJ SOLN
40.0000 mg | Freq: Every day | INTRAMUSCULAR | Status: DC
  Administered 2023-09-05: 40 mg via INTRAVENOUS
  Filled 2023-09-04: qty 4

## 2023-09-04 MED ORDER — POTASSIUM CHLORIDE 20 MEQ PO PACK
40.0000 meq | PACK | ORAL | Status: AC
  Administered 2023-09-04 – 2023-09-05 (×3): 40 meq via ORAL
  Filled 2023-09-04 (×3): qty 2

## 2023-09-04 MED ORDER — ACETAMINOPHEN 325 MG PO TABS
650.0000 mg | ORAL_TABLET | Freq: Four times a day (QID) | ORAL | Status: DC | PRN
  Administered 2023-09-05: 650 mg via ORAL
  Filled 2023-09-04: qty 2

## 2023-09-04 MED ORDER — HYDROCODONE-ACETAMINOPHEN 5-325 MG PO TABS
1.0000 | ORAL_TABLET | Freq: Three times a day (TID) | ORAL | Status: DC | PRN
  Administered 2023-09-04 – 2023-09-05 (×3): 1 via ORAL
  Filled 2023-09-04 (×3): qty 1

## 2023-09-04 MED ORDER — METOPROLOL SUCCINATE ER 25 MG PO TB24
25.0000 mg | ORAL_TABLET | Freq: Every day | ORAL | Status: DC
  Administered 2023-09-05 – 2023-09-06 (×2): 25 mg via ORAL
  Filled 2023-09-04 (×2): qty 1

## 2023-09-04 MED ORDER — APIXABAN 5 MG PO TABS
5.0000 mg | ORAL_TABLET | Freq: Two times a day (BID) | ORAL | Status: DC
  Administered 2023-09-04 – 2023-09-06 (×4): 5 mg via ORAL
  Filled 2023-09-04 (×4): qty 1

## 2023-09-04 MED ORDER — SODIUM CHLORIDE 0.9% FLUSH
3.0000 mL | Freq: Two times a day (BID) | INTRAVENOUS | Status: DC
  Administered 2023-09-04 – 2023-09-06 (×4): 3 mL via INTRAVENOUS

## 2023-09-04 MED ORDER — ANASTROZOLE 1 MG PO TABS
1.0000 mg | ORAL_TABLET | Freq: Every day | ORAL | Status: DC
  Administered 2023-09-05 – 2023-09-06 (×2): 1 mg via ORAL
  Filled 2023-09-04 (×3): qty 1

## 2023-09-04 MED ORDER — POTASSIUM CHLORIDE 10 MEQ/100ML IV SOLN
10.0000 meq | INTRAVENOUS | Status: AC
  Administered 2023-09-04 (×3): 10 meq via INTRAVENOUS
  Filled 2023-09-04 (×3): qty 100

## 2023-09-04 MED ORDER — FUROSEMIDE 10 MG/ML IJ SOLN
40.0000 mg | Freq: Once | INTRAMUSCULAR | Status: AC
  Administered 2023-09-04: 40 mg via INTRAVENOUS
  Filled 2023-09-04: qty 4

## 2023-09-04 MED ORDER — FENTANYL CITRATE PF 50 MCG/ML IJ SOSY
50.0000 ug | PREFILLED_SYRINGE | Freq: Once | INTRAMUSCULAR | Status: AC
  Administered 2023-09-04: 50 ug via INTRAVENOUS
  Filled 2023-09-04: qty 1

## 2023-09-04 MED ORDER — ENOXAPARIN SODIUM 40 MG/0.4ML IJ SOSY: 40.0000 mg | PREFILLED_SYRINGE | INTRAMUSCULAR | Status: DC

## 2023-09-04 NOTE — ED Provider Triage Note (Signed)
Emergency Medicine Provider Triage Evaluation Note  AADYA KINDLER , a 89 y.o. female  was evaluated in triage.  Pt complains of worsening shortness of breath and bilateral pedal edema over the past 2 weeks.  She denies fevers, cough, nasal congestion..  Review of Systems  Positive: Shortness of breath, pedal edema Negative: Chest pain, fevers, cough  Physical Exam  BP (!) 156/96 (BP Location: Left Arm)   Pulse 97   Temp 97.7 F (36.5 C)   Resp 17   Ht 5\' 4"  (1.626 m)   Wt 64.9 kg   SpO2 98%   BMI 24.56 kg/m  Gen:   Awake, no distress   Resp:  Normal effort  MSK:   Moves extremities without difficulty  Other:    Medical Decision Making  Medically screening exam initiated at 3:35 PM.  Appropriate orders placed.  [name] was informed that the remainder of the evaluation will be completed by another provider, this initial triage assessment does not replace that evaluation, and the importance of remaining in the ED until their evaluation is complete.  Labs and cxr ordered   Judithann Sheen, [place] 09/04/23 1536

## 2023-09-04 NOTE — Assessment & Plan Note (Signed)
Check magnesium, replete potassium, trend

## 2023-09-04 NOTE — Assessment & Plan Note (Signed)
This is a chronic diagnosis based on documentation of cardiology note from November last year.  Patient has preserved ejection fraction.  Patient is now having slight fluid overload with jugular venous distention.  At this time patient has received 40 mg of IV Lasix, I will continue with same daily.  Monitor intake output and daily weights.  Minimal troponin elevation is nonspecific, we will continue to trend this.  Send has chronic atrial fibrillation, has previously had recurrent cardioversions done,  currently on rate control strategy.  No recent echo on file, I do notice patient systolic murmur, will get echo updated for this patient.  When her echo was done in 2022, there was concern for amyloidosis, patient was recommended to have UPEP SPEP done.

## 2023-09-04 NOTE — ED Notes (Signed)
Report received from [facility]. Assumed care of pt at this time.

## 2023-09-04 NOTE — ED Triage Notes (Signed)
Pt sent by neurosurgeon for int SOB and leg edema [id number]. Pt has dyspnea w/exertion.Pt has 2+ edema of LE bilat.

## 2023-09-04 NOTE — ED Provider Notes (Signed)
[facility] AT [facility] Provider Note   CSN: [id number] Arrival date & time: 09/04/23  1340     History  No chief complaint on file.   Molly Tucker is a 89 y.o. female.  HPI Patient presents with shortness of breath and swelling of her legs.  Sent in by neurosurgery.  Has a known spinal compression fracture.  Has had more shortness of breath.  States she cannot sleep flat and not do the same activity as before.  Also history of A-fib and is on anticoagulation.   Past Medical History:  Diagnosis Date   Arthritis    Atrial tachycardia (HCC)    Breast cancer (HCC) 11/30/2020   Carotid bruit 2005   right, carotid doppler: <39% occlusion   Complication of anesthesia    Dysrhythmia    Chronic A. Fib   Family history of breast cancer    Family history of GI tract cancer    Family history of stomach cancer    Hyperlipidemia    LDL goal =<120   Hyperplastic colonic polyp 2006   Dr. Leone Payor, due 2016   Hypertension    Osteoporosis    S/P  biphosphonates x 5 yrs   Persistent atrial fibrillation (HCC)    PONV (postoperative nausea and vomiting)     Home Medications Prior to Admission medications   Medication Sig Start Date End Date Taking? Authorizing Provider  acetaminophen (TYLENOL) 500 MG tablet Take 500 mg by mouth every 6 (six) hours as needed for moderate pain.    [provider]  anastrozole (ARIMIDEX) 1 MG tablet Take 1 tablet (1 mg total) by mouth daily. 09/11/22   Rachel Moulds, MD  Cholecalciferol (VITAMIN D) 50 MCG (2000 UT) tablet Take 2,000 Units by mouth every morning.    [provider]  ELIQUIS 5 MG TABS tablet TAKE 1 TABLET BY MOUTH TWICE  DAILY 04/24/23   Duke Salvia, MD  HYDROcodone-acetaminophen (NORCO) 5-325 MG tablet Take 1 tablet by mouth every 8 (eight) hours as needed (chronic back pain). 08/15/23   Pincus Sanes, MD  metoprolol succinate (TOPROL-XL) 25 MG 24 hr tablet TAKE 1 TABLET BY MOUTH  ONCE  DAILY 08/28/23   Duke Salvia, MD      Allergies    Patient has no known allergies.    Review of Systems   Review of Systems  Physical Exam Updated Vital Signs BP (!) 161/95   Pulse 84   Temp 98.3 F (36.8 C) (Oral)   Resp 16   Ht 5\' 4"  (1.626 m)   Wt 64.9 kg   SpO2 100%   BMI 24.56 kg/m  Physical Exam Vitals and nursing note reviewed.  HENT:     Head: Normocephalic.  Eyes:     Pupils: Pupils are equal, round, and reactive to light.  Cardiovascular:     Rate and Rhythm: Regular rhythm.  Musculoskeletal:     Comments: Pitting edema bilateral lower extremities.  Does have chronic venous changes in her feet.  Neurological:     Mental Status: She is alert and oriented to person, place, and time.     ED Results / Procedures / Treatments   Labs (all labs ordered are listed, but only abnormal results are displayed) Labs Reviewed  COMPREHENSIVE METABOLIC PANEL - Abnormal; Notable for the following components:      Result Value   Potassium 2.9 (*)    Glucose, Bld 103 (*)    Total Protein  6.2 (*)    Albumin 3.3 (*)    All other components within normal limits  CBC - Abnormal; Notable for the following components:   RBC 3.32 (*)    Hemoglobin 10.5 (*)    HCT 33.1 (*)    All other components within normal limits  BRAIN NATRIURETIC PEPTIDE - Abnormal; Notable for the following components:   B Natriuretic Peptide 521.9 (*)    All other components within normal limits  TROPONIN I (HIGH SENSITIVITY) - Abnormal; Notable for the following components:   Troponin I (High Sensitivity) 19 (*)    All other components within normal limits  TROPONIN I (HIGH SENSITIVITY) - Abnormal; Notable for the following components:   Troponin I (High Sensitivity) 24 (*)    All other components within normal limits  RESP PANEL BY RT-PCR (RSV, FLU A&B, COVID)  [id number]    EKG EKG Interpretation Date/Time:  Tuesday September 04 2023 14:37:34 EST Ventricular Rate:  90 PR Interval:     QRS Duration:  84 QT Interval:  370 QTC Calculation: 452 R Axis:   90  Text Interpretation: Atrial fibrillation Rightward axis Possible Anteroseptal infarct , age undetermined Abnormal ECG When compared with ECG of 17-Mar-2020 14:05,  afib is new since 2021 Confirmed by Benjiman Core [phone]) on 09/04/2023 7:42:04 PM  Radiology DG Chest 1 View  Result Date: 09/04/2023 CLINICAL DATA:  Shortness of breath and leg edema EXAM: CHEST  1 VIEW COMPARISON:  Chest radiograph 08/18/2022 FINDINGS: The heart is enlarged.  The upper mediastinal contours are stable There are moderate right larger than left bilateral pleural effusions with associated airspace opacities in both lung bases. There is vascular congestion without definite overt edema. There is no pneumothorax There is no acute osseous abnormality. Left breast and axillary surgical clips are noted. IMPRESSION: Cardiomegaly with vascular congestion and moderate right larger than left pleural effusions. Electronically Signed   By: Lesia Hausen M.D.   On: 09/04/2023 15:51    Procedures Procedures    Medications Ordered in ED Medications  potassium chloride 10 mEq in 100 mL IVPB (has no administration in time range)  furosemide (LASIX) injection 40 mg (has no administration in time range)  fentaNYL (SUBLIMAZE) injection 50 mcg (50 mcg Intravenous Given 09/04/23 1949)    ED Course/ Medical Decision Making/ A&P                                 Medical Decision Making Amount and/or Complexity of Data Reviewed Labs: ordered.  Risk Prescription drug management.   Patient shortness of breath.  Edema on her legs.  Appears to be likely have some CHF.  Is on anticoagulation.  Dyspneic and states she cannot do the same activity.  Troponin slightly elevated.  EKG shows atrial fibrillation which she has had previously.  She is rate controlled.  Will recheck troponin.  Will add BNP.  I think likely require admission the hospital for  diuresis.  BNP is elevated.  Troponin repeat is still elevated but stable.  Has hypokalemia.  With Lasix need this will likely drop more.  Discussed with patient about admission.  She has been on increasing doses of Lasix for the last couple months without relief.  I think with difficulty sleeping and activity level benefit from admission to the hospital for more acute diuresis.  Will discuss with hospitalist.         Final Clinical Impression(s) / ED  Diagnoses Final diagnoses:  Congestive heart failure, unspecified HF chronicity, unspecified heart failure type (HCC)  Hypokalemia    Rx / DC Orders ED Discharge Orders     None         Benjiman Core, MD 09/04/23 2051

## 2023-09-04 NOTE — Assessment & Plan Note (Addendum)
This is severe, it may be contributing to patient's symptoms.  Patient received 30 mEq of IV KCl in the ER.  Patient further received IV dose of Lasix, I will replete this aggressively in this patient with normal renal function and getting Lasix.  I will order 40 mEq KCl p.o. every 4 hourly for 3 doses this evening, recheck in the morning.  Maintain on telemetry.  Add on magnesium level

## 2023-09-04 NOTE — Progress Notes (Deleted)
Cardiology Office Note Date:  09/04/2023  Patient ID:  Fanta, Wimberley 26-Nov-1931, MRN [id number] PCP:  Pincus Sanes, MD  Electrophysiologist:  Dr. Graciela Husbands   Chief Complaint:   *** edema  History of Present Illness: SHANASIA IBRAHIM is a 89 y.o. female with history of PAFib, HTN, HLD, and osteoporosis, chronic CHF (diastolic).  She comes in today to be seen for Dr. Graciela Husbands, last seen by him it looks, 2017, subsequently following regularly with Darrel Hoover, NP. Most recently 12/14/20, noted Dec 2021 pt called with recurrent Afib, her amio increased, though wanted to wait until after the holidays to come in.  Amio making her feel a bit nauseated.  Wanted to try DCCV, scheduled for a lumpectomy the following week. Planned to follow up in a month to visit DCCV vs rate control strategy, noting not as symptomatic with her Afib as she had been historically.   I saw her 01/20/21 She is recovering from her lumpectomy, still a bit sore, though doing well. She can tell she is is still in Afib, "something is just different" No CP, no SOB. She thinks she may tend to get a little more swollen intermittently Though does not take the lasix every day. She notes the swelling towrads mid-late day, gone in the morning. No dizzy spells, near syncope or syncope. She is able to do the things she needs and wants to do. From the Afib perspective she thinks what bothers her the most is the stomach upset that she is fairly certain is the amiodarone She had a nose bleed not too long ago that took a while to stop, none again, no bleeding or signs of bleeding otherwise. She was no longer interested in rhythm control, cardioversions and her amiodarone stopped Planned for 48mo f/u  I saw her 03/03/21 Feels much better off the amiodarone No CP, no real palpitations or cardiac awareness No SOB, DOE No dizzy spells, near syncope or syncope NO bleeding or signs of bleeding  She wears support stockings when out of  the house, has no swelling when she wakes but accumulates through the day She has had "terrible veins" in her legs for many many years No changes were made, she was asked to monitor her BP, planned for 48mo visit   I saw her 08/01/21 She is all in all doing OK.  Some days she just doesn't think she has as much energy as she did when in SR, though other days feels pretty good. She denies difficulty with ADLs but some days has more energy then others, or better stamina. No CP, no SOB or DOE. No bleeding or signs of bleeding No near syncope or syncope She is able to see her pulse in her neck, this is worrisome to her She takes the lasix when she is swelling, will skip it days that she is busy, doesn't take it every day   I did not think she had further rhythm control options, HR was good Planned for daily lasix with some volume, and updating her echo  LVEF 70-75%, hyperdynamic, no WMA, mod LVH RV enlargement with mod elevated p. Pressures septum with speckled appearance  consider ordering a limited echocardiogram with strain to assess for  cardiac amyloid.  Reviewed with Dr. Graciela Husbands who recommended limited echo  strain to assess for cardiac amyloid 1) urine serum Immunoglobulins with immunofixation  2) urine for light chains  3) Pyrophosphate scan (PYP scan)  She has deferred the above 2/2 husband's illness.  I saw her 05/24/22 Sadly her husband passed away since her last visit. She doing fairly well Noted when up and on her feet more like of late that she is more swollen in her feet/legs then usual She has numerous superficial spider veins and known varicose veins, and suspects is this. She had not been taking her lasix regularly but has resumed daily dosing the last few weeks. She has been on her feet a lot going through the house, getting ready to start to downsize. No CP, no palpitations She walks her dog around the block for exercise, when coming back towards her house is a bit  of an incline and gets her winded. Not new for her She does which she had more energy She has numbness to the fingertips of her left thumb and 4th finger, her PMD felt pinched nerve  We discussed testing recommended by Dr. Graciela Husbands. She is very weary about going down a road of a bunch pf testing and new/more medicines at her age Discussed rational for the testing  She does not want to do them, no changes for now. Lasix dose was pulsed  Planned for echo and labs and 4 mo f/u  No echo ordered it seems, ? Suspect echo discussion was a left over from her prior visit perhaps.  I saw her 09/28/22 She is feeling better. Back to her typical activities, walking the dog, feels a little winded when getting back but does not have to stop. No CP No awareness of her Afib other then she knows she has it. No SOB No near syncope or syncope. No bleeding or signs of bleeding. Support hose have really kept her swelling in check  She tells me the fall in the yard happened while watering and slipped on a plant.  She did end up with a couple broken ribs, though never got any bruising and did not have much pain with it  No other falls, denies any gait instabilities  09/04/23: sent to ER via neurosurgery clinic for progressive edema, SOB ***  *** rate *** eliquis, dose, bleeding, ER labs *** volume, plan  PAF Hx: Diagnosed April 2016 DCCV: 2016 2017 2018 2020 Failed April 2021  AAD Amiodarone started March 2021 subsequently stopped for rate control strategy march 2022   Past Medical History:  Diagnosis Date   Arthritis    Atrial tachycardia (HCC)    Breast cancer (HCC) 11/30/2020   Carotid bruit 2005   right, carotid doppler: <39% occlusion   Complication of anesthesia    Dysrhythmia    Chronic A. Fib   Family history of breast cancer    Family history of GI tract cancer    Family history of stomach cancer    Hyperlipidemia    LDL goal =<120   Hyperplastic colonic polyp 2006   Dr.  Leone Payor, due 2016   Hypertension    Osteoporosis    S/P  biphosphonates x 5 yrs   Persistent atrial fibrillation (HCC)    PONV (postoperative nausea and vomiting)     Past Surgical History:  Procedure Laterality Date   BREAST LUMPECTOMY     BREAST LUMPECTOMY WITH SENTINEL LYMPH NODE BIOPSY Left 12/23/2020   Procedure: LEFT BREAST LUMPECTOMY WITH SENTINEL LYMPH NODE BX;  Surgeon: Almond Lint, MD;  Location: [facility] OR;  Service: General;  Laterality: Left;  RNFA, PEC BLOCK; START TIME OF 1030 rm 2 per ivey   CARDIOVERSION N/A 03/31/2015   Procedure: CARDIOVERSION;  Surgeon: Thurmon Fair, MD;  Location: [facility] ENDOSCOPY;  Service: Cardiovascular;  Laterality: N/A;   CARDIOVERSION N/A 12/21/2015   Procedure: CARDIOVERSION;  Surgeon: Lewayne Bunting, MD;  Location: [facility] ENDOSCOPY;  Service: Cardiovascular;  Laterality: N/A;   CARDIOVERSION N/A 01/09/2017   Procedure: CARDIOVERSION;  Surgeon: Jake Bathe, MD;  Location: [facility] ENDOSCOPY;  Service: Cardiovascular;  Laterality: N/A;   CARDIOVERSION N/A 07/09/2019   Procedure: CARDIOVERSION;  Surgeon: Thurmon Fair, MD;  Location: [facility] ENDOSCOPY;  Service: Cardiovascular;  Laterality: N/A;   CARDIOVERSION N/A 01/14/2020   Procedure: CARDIOVERSION;  Surgeon: Sande Rives, MD;  Location: [facility] ENDOSCOPY;  Service: Cardiovascular;  Laterality: N/A;   CARDIOVERSION N/A 02/23/2020   Procedure: CARDIOVERSION;  Surgeon: Jake Bathe, MD;  Location: [facility] ENDOSCOPY;  Service: Cardiovascular;  Laterality: N/A;   COLONOSCOPY W/ POLYPECTOMY  10/30/2004   DILATION AND CURETTAGE OF UTERUS     MOUTH SURGERY  2009-2010   implants, Dr. Dereck Leep    Current Outpatient Medications  Medication Sig Dispense Refill   acetaminophen (TYLENOL) 500 MG tablet Take 500 mg by mouth every 6 (six) hours as needed for moderate pain.     anastrozole (ARIMIDEX) 1 MG tablet Take 1 tablet (1 mg total) by mouth daily. 90 tablet 3   Cholecalciferol (VITAMIN D) 50 MCG (2000 UT)  tablet Take 2,000 Units by mouth every morning.     ELIQUIS 5 MG TABS tablet TAKE 1 TABLET BY MOUTH TWICE  DAILY 180 tablet 3   HYDROcodone-acetaminophen (NORCO) 5-325 MG tablet Take 1 tablet by mouth every 8 (eight) hours as needed (chronic back pain). 90 tablet 0   metoprolol succinate (TOPROL-XL) 25 MG 24 hr tablet TAKE 1 TABLET BY MOUTH ONCE  DAILY 30 tablet 0   No current facility-administered medications for this visit.    Allergies:   Patient has no known allergies.   Social History:  The patient  reports that she quit smoking about 46 years ago. Her smoking use included cigarettes. She has never used smokeless tobacco. She reports that she does not drink alcohol and does not use drugs.   Family History:  The patient's family history includes Arthritis in an other family member; Breast cancer (age of onset: 89) in her sister; Cancer in her paternal aunt and paternal aunt; Coronary artery disease in her father and sister; Intellectual disability in her paternal uncle; Stomach cancer in her maternal uncle.  ROS:  Please see the history of present illness.    All other systems are reviewed and otherwise negative.   PHYSICAL EXAM:  VS:  There were no vitals taken for this visit. BMI: There is no height or weight on file to calculate BMI. Well nourished, well developed, in no acute distress  HEENT: normocephalic, atraumatic  Neck: no JVD, carotid bruits or masses Cardiac: ***  irreg-irreg, soft SM, no rubs, or gallops Lungs: *** CTA b/l, no wheezing, rhonchi or rales  Abd: soft, nontender MS: no deformity age appropriate atrophy Ext:  *** wearing support hose, no pitting edema, *** known by prior exam to have numerous spider veins with some large veins as well, some today noted even with the stockings on Skin: warm and dry, no rash Neuro:  No gross deficits appreciated Psych: euthymic mood, full affect   EKG:  not done today 09/04/23: personally reviewed AFib 90bpm, unchanged from  prior  08/23/21 TTE 1. Left ventricular ejection fraction, by estimation, is 70 to 75%. The  left ventricle has hyperdynamic function. The left ventricle has no  regional wall motion abnormalities. There is moderate left ventricular  hypertrophy. Left ventricular diastolic  parameters are indeterminate.   2. Right ventricular systolic function is normal. The right ventricular  size is moderately enlarged. There is mildly elevated pulmonary artery  systolic pressure.   3. Left atrial size was moderately dilated.   4. Right atrial size was severely dilated.   5. The mitral valve is normal in structure. Mild mitral valve  regurgitation.   6. The aortic valve is normal in structure. Aortic valve regurgitation is  not visualized. No aortic stenosis is present.   7. The inferior vena cava is dilated in size with >50% respiratory  variability, suggesting right atrial pressure of 8 mmHg.   Conclusion(s)/Recommendation(s): No significant change from prior echo  03/08/2020. With LVH, biatrial enlargement, septum with speckled appearance  consider ordering a limited echocardiogram with strain to assess for  cardiac amyloid.    03/08/2020; TTE IMPRESSIONS   1. Left ventricular ejection fraction, by estimation, is 60 to 65%. The  left ventricle has normal function. The left ventricle has no regional  wall motion abnormalities. Left ventricular diastolic parameters are  consistent with Grade II diastolic  dysfunction (pseudonormalization). Elevated left ventricular end-diastolic  pressure.   2. Right ventricular systolic function is normal. The right ventricular  size is moderately enlarged. There is moderately elevated pulmonary artery  systolic pressure. The estimated right ventricular systolic pressure is  58.1 mmHg.   3. Left atrial size was moderately dilated.   4. Right atrial size was severely dilated.   5. The mitral valve is normal in structure. Mild mitral valve  regurgitation. No  evidence of mitral stenosis.   6. Tricuspid valve regurgitation is moderate.   7. The aortic valve is tricuspid. Aortic valve regurgitation is not  visualized. Mild aortic valve sclerosis is present, with no evidence of  aortic valve stenosis.   8. The inferior vena cava is normal in size with <50% respiratory  variability, suggesting right atrial pressure of 8 mmHg.   03/08/15: Echocardiogram Study Conclusions - Left ventricle: The cavity size was normal. Wall thickness was   normal. Systolic function was normal. The estimated ejection   fraction was in the range of 55% to 60%. Wall motion was normal;   there were no regional wall motion abnormalities. - Right ventricle: The cavity size was mildly dilated. - Right atrium: The atrium was mildly dilated. - Atrial septum: No defect or patent foramen ovale was identified. - Tricuspid valve: There was moderate regurgitation. LA 41mm  Recent Labs: 06/20/2023: TSH 2.26 09/04/2023: ALT 12; BUN 12; Creatinine, Ser 0.78; Hemoglobin 10.5; Platelets 274; Potassium 2.9; Sodium 138  06/20/2023: Cholesterol 159; HDL 53.60; LDL Cholesterol 92; Total CHOL/HDL Ratio 3; Triglycerides 69.0; VLDL 13.8   Estimated Creatinine Clearance: 41.2 mL/min (by C-G formula based on SCr of 0.78 mg/dL).   Wt Readings from Last 3 Encounters:  09/04/23 143 lb 1.3 oz (64.9 kg)  08/10/23 143 lb (64.9 kg)  07/23/23 142 lb (64.4 kg)     Other studies reviewed: Additional studies/records reviewed today include: summarized above  ASSESSMENT AND PLAN:  1.   Longstanding Persistent >> permanent AFib       CHADS2Vasc is 5 on Eliquis,  appropriately dosed       *** asymptomatic        *** Rate controlled                 2. HTN     *** Looks  OK, she reports better at home  3. Non-pitting edema     dependent and 2/2 venous insufficiency  4. Chronic CHF (diastolic) No pitting edema, wearing support stockings today No SOB or exam findings of volume OL She does not  want tto pursue echo   Disposition: ***    Current medicines are reviewed at length with the patient today.  The patient did not have any concerns regarding medicines.  Judith Blonder, PA-C 09/04/2023 4:53 PM     [facility] [address] [place] [address] [phone] (office)  [phone] (fax)

## 2023-09-04 NOTE — H&P (Signed)
History and Physical    Patient: Molly Tucker WJX:[id number] DOB: 18-Jun-1932 DOA: 09/04/2023 DOS: the patient was seen and examined on 09/04/2023 PCP: Pincus Sanes, MD  Patient coming from:  neurosurgery office.  Chief Complaint: No chief complaint on file.  HPI: Molly Tucker is a 89 y.o. female with medical history significant of known atrial fibrillation as well as chronic diagnosis of ?congestive heart failure  Patient reports being in her usual state of health till approximately 2 weeks ago when she reports new onset of bilateral lower extremity swelling around ankles, that was typically worsened by prolonged dependency.  Patient also reports some leg cramping.  She further reports sensation of shortness of breath while walking around on level ground which is new and has slightly worsened since then.  She denies any chest pain or fever, although she occasionally has nausea.  Patient denies any palpitation loss of consciousness or falls.  Patient reports that her Lasix dose was increased a couple of months weeks ago.  But her symptoms did not improve.  There is no history of orthopnea  Patient was at outpatient neurosurgical office today to follow-up on her thoracic vertebral fracture that happened about 2 months ago.  Neurosurgeon was concerned about the patient's leg swelling as well as reported sensation of shortness of breath with ambulation, prompting the patient to be sent to the ER.  In the ER impression is made of patient being fluid overloaded, medical evaluation is sought.  Patient has received Lasix and is making urine output. Review of Systems: As mentioned in the history of present illness. All other systems reviewed and are negative. Past Medical History:  Diagnosis Date   Arthritis    Atrial tachycardia (HCC)    Breast cancer (HCC) 11/30/2020   Carotid bruit 2005   right, carotid doppler: <39% occlusion   Complication of anesthesia    Dysrhythmia    Chronic A.  Fib   Family history of breast cancer    Family history of GI tract cancer    Family history of stomach cancer    Hyperlipidemia    LDL goal =<120   Hyperplastic colonic polyp 2006   Dr. Leone Payor, due 2016   Hypertension    Osteoporosis    S/P  biphosphonates x 5 yrs   Persistent atrial fibrillation (HCC)    PONV (postoperative nausea and vomiting)    Past Surgical History:  Procedure Laterality Date   BREAST LUMPECTOMY     BREAST LUMPECTOMY WITH SENTINEL LYMPH NODE BIOPSY Left 12/23/2020   Procedure: LEFT BREAST LUMPECTOMY WITH SENTINEL LYMPH NODE BX;  Surgeon: Almond Lint, MD;  Location: [facility] OR;  Service: General;  Laterality: Left;  RNFA, PEC BLOCK; START TIME OF 1030 rm 2 per ivey   CARDIOVERSION N/A 03/31/2015   Procedure: CARDIOVERSION;  Surgeon: Thurmon Fair, MD;  Location: [facility] ENDOSCOPY;  Service: Cardiovascular;  Laterality: N/A;   CARDIOVERSION N/A 12/21/2015   Procedure: CARDIOVERSION;  Surgeon: Lewayne Bunting, MD;  Location: [facility] ENDOSCOPY;  Service: Cardiovascular;  Laterality: N/A;   CARDIOVERSION N/A 01/09/2017   Procedure: CARDIOVERSION;  Surgeon: Jake Bathe, MD;  Location: [facility] ENDOSCOPY;  Service: Cardiovascular;  Laterality: N/A;   CARDIOVERSION N/A 07/09/2019   Procedure: CARDIOVERSION;  Surgeon: Thurmon Fair, MD;  Location: [facility] ENDOSCOPY;  Service: Cardiovascular;  Laterality: N/A;   CARDIOVERSION N/A 01/14/2020   Procedure: CARDIOVERSION;  Surgeon: Sande Rives, MD;  Location: [facility] ENDOSCOPY;  Service: Cardiovascular;  Laterality: N/A;   CARDIOVERSION N/A  02/23/2020   Procedure: CARDIOVERSION;  Surgeon: Jake Bathe, MD;  Location: [facility] ENDOSCOPY;  Service: Cardiovascular;  Laterality: N/A;   COLONOSCOPY W/ POLYPECTOMY  10/30/2004   DILATION AND CURETTAGE OF UTERUS     MOUTH SURGERY  2009-2010   implants, Dr. Dereck Leep   Social History:  reports that she quit smoking about 46 years ago. Her smoking use included cigarettes. She has never used  smokeless tobacco. She reports that she does not drink alcohol and does not use drugs.  No Known Allergies  Family History  Problem Relation Age of Onset   Coronary artery disease Father        MI in 64s   Coronary artery disease Sister        stent 2009   Breast cancer Sister 40       bilateral, diagnosed in other breast at age 55   Cancer Paternal Aunt        colon, possible breast cancer, dx 50s/60s   Arthritis Other        aunts   Stomach cancer Maternal Uncle        dx 62s   Intellectual disability Paternal Uncle    Cancer Paternal Aunt        gastrointestinal, dx 50s/60s   Stroke Neg Hx     Prior to Admission medications   Medication Sig Start Date End Date Taking? Authorizing Provider  acetaminophen (TYLENOL) 500 MG tablet Take 500 mg by mouth every 6 (six) hours as needed for moderate pain.   Yes [provider]  anastrozole (ARIMIDEX) 1 MG tablet Take 1 tablet (1 mg total) by mouth daily. 09/11/22  Yes Rachel Moulds, MD  Cholecalciferol (VITAMIN D) 50 MCG (2000 UT) tablet Take 2,000 Units by mouth every morning.   Yes [provider]  ELIQUIS 5 MG TABS tablet TAKE 1 TABLET BY MOUTH TWICE  DAILY Patient taking differently: Take 5 mg by mouth 2 (two) times daily. 04/24/23  Yes Duke Salvia, MD  furosemide (LASIX) 40 MG tablet Take 40 mg by mouth daily as needed for edema.   Yes [provider]  HYDROcodone-acetaminophen (NORCO) 5-325 MG tablet Take 1 tablet by mouth every 8 (eight) hours as needed (chronic back pain). 08/15/23  Yes Burns, Bobette Mo, MD  ibuprofen (ADVIL) 200 MG tablet Take 600 mg by mouth 3 (three) times daily.   Yes [provider]  metoprolol succinate (TOPROL-XL) 25 MG 24 hr tablet TAKE 1 TABLET BY MOUTH ONCE  DAILY 08/28/23  Yes Duke Salvia, MD    Physical Exam: Vitals:   09/04/23 1348 09/04/23 1500 09/04/23 1937 09/04/23 2130  BP: (!) 156/96  (!) 161/95 (!) 148/83  Pulse: 97  84 98  Resp: 17  16 18    Temp: 97.7 F (36.5 C)  98.3 F (36.8 C)   TempSrc:   Oral   SpO2: 98%  100% 98%  Weight:  64.9 kg    Height:  5\' 4"  (1.626 m)     General: Thin lady no immediate distress apparent.  Alert and awake, gives a coherent account of her symptoms, slightly hard of hearing. Cardiovascular exam S1-S2 normal, systolic murmur, jugular venous distention while patient is sitting almost upright. Respiratory exam: Bilateral intravesicular Abdomen all quadrants are soft nontender Extremities trace edema up to bilateral mid shin area, 1+ edmea bialteral ankle. No skin changes. Data Reviewed:  Labs on Admission:  Results for orders placed or performed during the hospital encounter of  09/04/23 (from the past 24 hour(s))  Comprehensive metabolic panel     Status: Abnormal   Collection Time: 09/04/23  3:03 PM  Result Value Ref Range   Sodium 138 135 - 145 mmol/L   Potassium 2.9 (L) 3.5 - 5.1 mmol/L   Chloride 100 98 - 111 mmol/L   CO2 29 22 - 32 mmol/L   Glucose, Bld 103 (H) 70 - 99 mg/dL   BUN 12 8 - 23 mg/dL   Creatinine, Ser 1.61 0.44 - 1.00 mg/dL   Calcium 8.9 8.9 - 09.6 mg/dL   Total Protein 6.2 (L) 6.5 - 8.1 g/dL   Albumin 3.3 (L) 3.5 - 5.0 g/dL   AST 24 15 - 41 U/L   ALT 12 0 - 44 U/L   Alkaline Phosphatase 59 38 - 126 U/L   Total Bilirubin 1.0 <1.2 mg/dL   GFR, Estimated >04 >54 mL/min   Anion gap 9 5 - 15  CBC     Status: Abnormal   Collection Time: 09/04/23  3:03 PM  Result Value Ref Range   WBC 6.0 4.0 - 10.5 K/uL   RBC 3.32 (L) 3.87 - 5.11 MIL/uL   Hemoglobin 10.5 (L) 12.0 - 15.0 g/dL   HCT 09.8 (L) 11.9 - 14.7 %   MCV 99.7 80.0 - 100.0 fL   MCH 31.6 26.0 - 34.0 pg   MCHC 31.7 30.0 - 36.0 g/dL   RDW 82.9 56.2 - 13.0 %   Platelets 274 150 - 400 K/uL   nRBC 0.0 0.0 - 0.2 %  Troponin I (High Sensitivity)     Status: Abnormal   Collection Time: 09/04/23  3:03 PM  Result Value Ref Range   Troponin I (High Sensitivity) 19 (H) <18 ng/L  Troponin I (High Sensitivity)      Status: Abnormal   Collection Time: 09/04/23  5:03 PM  Result Value Ref Range   Troponin I (High Sensitivity) 24 (H) <18 ng/L  Brain natriuretic peptide     Status: Abnormal   Collection Time: 09/04/23  5:03 PM  Result Value Ref Range   B Natriuretic Peptide 521.9 (H) 0.0 - 100.0 pg/mL  Resp panel by RT-PCR (RSV, Flu A&B, Covid) Anterior Nasal Swab     Status: None   Collection Time: 09/04/23  7:41 PM   Specimen: Anterior Nasal Swab  Result Value Ref Range   SARS Coronavirus 2 by RT PCR NEGATIVE NEGATIVE   Influenza A by PCR NEGATIVE NEGATIVE   Influenza B by PCR NEGATIVE NEGATIVE   Resp Syncytial Virus by PCR NEGATIVE NEGATIVE   Basic Metabolic Panel: Recent Labs  Lab 09/04/23 1503  NA 138  K 2.9*  CL 100  CO2 29  GLUCOSE 103*  BUN 12  CREATININE 0.78  CALCIUM 8.9   Liver Function Tests: Recent Labs  Lab 09/04/23 1503  AST 24  ALT 12  ALKPHOS 59  BILITOT 1.0  PROT 6.2*  ALBUMIN 3.3*   No results for input(s): "LIPASE", "AMYLASE" in the last 168 hours. No results for input(s): "AMMONIA" in the last 168 hours. CBC: Recent Labs  Lab 09/04/23 1503  WBC 6.0  HGB 10.5*  HCT 33.1*  MCV 99.7  PLT 274   Cardiac Enzymes: Recent Labs  Lab 09/04/23 1503 09/04/23 1703  TROPONINIHS 19* 24*    BNP (last 3 results) No results for input(s): "PROBNP" in the last 8760 hours. CBG: No results for input(s): "GLUCAP" in the last 168 hours.  Radiological Exams on Admission:  DG Chest 1 View  Result Date: 09/04/2023 CLINICAL DATA:  Shortness of breath and leg edema EXAM: CHEST  1 VIEW COMPARISON:  Chest radiograph 08/18/2022 FINDINGS: The heart is enlarged.  The upper mediastinal contours are stable There are moderate right larger than left bilateral pleural effusions with associated airspace opacities in both lung bases. There is vascular congestion without definite overt edema. There is no pneumothorax There is no acute osseous abnormality. Left breast and axillary  surgical clips are noted. IMPRESSION: Cardiomegaly with vascular congestion and moderate right larger than left pleural effusions. Electronically Signed   By: Lesia Hausen M.D.   On: 09/04/2023 15:51    EKG: Independently reviewed. afib   Assessment and Plan: Hypokalemia This is severe, it may be contributing to patient's symptoms.  Patient received 30 mEq of IV KCl in the ER.  Patient further received IV dose of Lasix, I will replete this aggressively in this patient with normal renal function and getting Lasix.  I will order 40 mEq KCl p.o. every 4 hourly for 3 doses this evening, recheck in the morning.  Maintain on telemetry.  Add on magnesium level  CHF (congestive heart failure) (HCC) This is a chronic diagnosis based on documentation of cardiology note from November last year.  Patient has preserved ejection fraction.  Patient is now having slight fluid overload with jugular venous distention.  At this time patient has received 40 mg of IV Lasix, I will continue with same daily.  Monitor intake output and daily weights.  Minimal troponin elevation is nonspecific, we will continue to trend this.  Send has chronic atrial fibrillation, has previously had recurrent cardioversions done,  currently on rate control strategy.  No recent echo on file, I do notice patient systolic murmur, will get echo updated for this patient.  When her echo was done in 2022, there was concern for amyloidosis, patient was recommended to have UPEP SPEP done.      Advance Care Planning:   Code Status: Full Code discussed with patient.  Consults: none at this time.  Family Communication: per patient  Severity of Illness: The appropriate patient status for this patient is INPATIENT. Inpatient status is judged to be reasonable and necessary in order to provide the required intensity of service to ensure the patient's safety. The patient's presenting symptoms, physical exam findings, and initial radiographic and  laboratory data in the context of their chronic comorbidities is felt to place them at high risk for further clinical deterioration. Furthermore, it is not anticipated that the patient will be medically stable for discharge from the hospital within 2 midnights of admission.   * I certify that at the point of admission it is my clinical judgment that the patient will require inpatient hospital care spanning beyond 2 midnights from the point of admission due to high intensity of service, high risk for further deterioration and high frequency of surveillance required.*  Author: Nolberto Hanlon, MD 09/04/2023 10:22 PM  For on call review [email or web address].

## 2023-09-04 NOTE — Assessment & Plan Note (Signed)
This is recent, within the last couple of months, continue with hydrocodone

## 2023-09-05 ENCOUNTER — Inpatient Hospital Stay (HOSPITAL_COMMUNITY): Payer: Medicare Other

## 2023-09-05 ENCOUNTER — Ambulatory Visit: Payer: Medicare Other | Admitting: Physician Assistant

## 2023-09-05 DIAGNOSIS — I5031 Acute diastolic (congestive) heart failure: Secondary | ICD-10-CM

## 2023-09-05 DIAGNOSIS — I5030 Unspecified diastolic (congestive) heart failure: Secondary | ICD-10-CM | POA: Diagnosis not present

## 2023-09-05 LAB — ECHOCARDIOGRAM COMPLETE
Height: 64 in
S' Lateral: 1.9 cm
Weight: 2144 [oz_av]

## 2023-09-05 LAB — BASIC METABOLIC PANEL
Anion gap: 12 (ref 5–15)
BUN: 12 mg/dL (ref 8–23)
CO2: 27 mmol/L (ref 22–32)
Calcium: 8.6 mg/dL — ABNORMAL LOW (ref 8.9–10.3)
Chloride: 100 mmol/L (ref 98–111)
Creatinine, Ser: 0.74 mg/dL (ref 0.44–1.00)
GFR, Estimated: >60 mL/min (ref >60)
Glucose, Bld: 98 mg/dL (ref 70–99)
Potassium: 3.4 mmol/L — ABNORMAL LOW (ref 3.5–5.1)
Sodium: 139 mmol/L (ref 135–145)

## 2023-09-05 LAB — CBC
HCT: 31.2 % — ABNORMAL LOW (ref 36.0–46.0)
Hemoglobin: 9.8 g/dL — ABNORMAL LOW (ref 12.0–15.0)
MCH: 31.3 pg (ref 26.0–34.0)
MCHC: 31.4 g/dL (ref 30.0–36.0)
MCV: 99.7 fL (ref 80.0–100.0)
Platelets: 236 10*3/uL (ref 150–400)
RBC: 3.13 MIL/uL — ABNORMAL LOW (ref 3.87–5.11)
RDW: 14.1 % (ref 11.5–15.5)
WBC: 5.8 10*3/uL (ref 4.0–10.5)
nRBC: 0 % (ref 0.0–0.2)

## 2023-09-05 LAB — TSH: TSH: 3.722 u[IU]/mL (ref 0.350–4.500)

## 2023-09-05 LAB — PROTIME-INR
INR: 1.7 — ABNORMAL HIGH (ref 0.8–1.2)
Prothrombin Time: 20.4 s — ABNORMAL HIGH (ref 11.4–15.2)

## 2023-09-05 LAB — APTT: aPTT: 36 s (ref 24–36)

## 2023-09-05 LAB — TROPONIN I (HIGH SENSITIVITY): Troponin I (High Sensitivity): 23 ng/L — ABNORMAL HIGH (ref <18)

## 2023-09-05 MED ORDER — SENNOSIDES-DOCUSATE SODIUM 8.6-50 MG PO TABS
1.0000 | ORAL_TABLET | Freq: Two times a day (BID) | ORAL | Status: DC
Start: 2023-09-05 — End: 2023-09-06
  Administered 2023-09-05 – 2023-09-06 (×3): 1 via ORAL
  Filled 2023-09-05 (×3): qty 1

## 2023-09-05 MED ORDER — FUROSEMIDE 10 MG/ML IJ SOLN
40.0000 mg | Freq: Two times a day (BID) | INTRAMUSCULAR | Status: DC
  Administered 2023-09-05: 40 mg via INTRAVENOUS
  Filled 2023-09-05: qty 4

## 2023-09-05 MED ORDER — ONDANSETRON HCL 4 MG/2ML IJ SOLN
4.0000 mg | Freq: Four times a day (QID) | INTRAMUSCULAR | Status: DC | PRN
  Administered 2023-09-05: 4 mg via INTRAVENOUS
  Filled 2023-09-05: qty 2

## 2023-09-05 MED ORDER — MAGNESIUM SULFATE 2 GM/50ML IV SOLN
2.0000 g | Freq: Once | INTRAVENOUS | Status: AC
  Administered 2023-09-05: 2 g via INTRAVENOUS
  Filled 2023-09-05: qty 50

## 2023-09-05 MED ORDER — HYDROMORPHONE HCL 1 MG/ML IJ SOLN
0.5000 mg | Freq: Once | INTRAMUSCULAR | Status: AC
  Administered 2023-09-05: 0.5 mg via INTRAVENOUS
  Filled 2023-09-05: qty 1

## 2023-09-05 NOTE — Progress Notes (Signed)
PROGRESS NOTE    Molly Tucker  AOZ:[id number] DOB: September 15, 1932 DOA: 09/04/2023 PCP: Pincus Sanes, MD  89/F with history of permanent A-fib, diastolic CHF presented to the ED with lower extremity edema which she noticed 1 to 2 weeks ago, in addition also reported mild dyspnea on exertion.  Her Lasix dose was increased a few weeks ago.  Was at the neurosurgeons office for follow-up of thoracic vertebral fracture which occurred 2 months ago, neurosurgeon was concerned about patient's swelling and dyspnea on exertion and she was sent to the ED Vital signs stable, rate controlled A-fib, labs noted creatinine 0.7, potassium 2.9, BNP 521, hemoglobin 10.5, chest x-ray noted cardiomegaly, pleural effusions, moderate on the right  Subjective: -Feels better, breathing is improving  Assessment and Plan:  Acute on chronic diastolic CHF -Last echo 10/22 with EF 70-75%, normal RV -Follow-up repeat echo -Continue IV Lasix today -Conservative management on account of advanced age and frailty -Ambulate, PT OT eval  Permanent A-fib -Reportedly has had multiple cardioversions, currently on rate control strategy -Follow-up echo -Continue Toprol and apixaban  Hypokalemia -Replaced, mag is low as well, will replace  Compression fracture of thoracic vertebra (HCC) This is recent, within the last couple of months, continue with hydrocodone, add Senokot  Chronic anemia Monitor  DVT prophylaxis: Apixaban Code Status: Full code Family Communication: No family at bedside Disposition Plan: Home pending clinical improvement, likely 1 to 2 days  Consultants:    Procedures:   Antimicrobials:    Objective: Vitals:   09/05/23 0515 09/05/23 0635 09/05/23 0800 09/05/23 1013  BP: (!) 153/81 (!) 164/90  126/76  Pulse: 98 89    Resp: (!) 21 20    Temp: 98.1 F (36.7 C) 98.1 F (36.7 C) 97.8 F (36.6 C) 97.7 F (36.5 C)  TempSrc: Oral Oral Oral Oral  SpO2: 100% 98% 95%   Weight:  60.8 kg     Height:  5\' 4"  (1.626 m)      Intake/Output Summary (Last 24 hours) at 09/05/2023 1418 Last data filed at 09/05/2023 1127 Gross per 24 hour  Intake 200 ml  Output 550 ml  Net -350 ml   Filed Weights   09/04/23 1500 09/05/23 0635  Weight: 64.9 kg 60.8 kg    Examination:  General exam: Appears calm and comfortable HEENT: Positive JVD Respiratory system: Rare basilar Rales otherwise clear Cardiovascular system: S1 & S2 heard, RRR.  Systolic murmur Abd: nondistended, soft and nontender.Normal bowel sounds heard. Central nervous system: Alert and oriented. No focal neurological deficits. Extremities: Trace edema Skin: No rashes Psychiatry:  Mood & affect appropriate.     Data Reviewed:   CBC: Recent Labs  Lab 09/04/23 1503 09/04/23 2301 09/05/23 0143  WBC 6.0 6.3 5.8  HGB 10.5* 11.0* 9.8*  HCT 33.1* 34.1* 31.2*  MCV 99.7 100.6* 99.7  PLT 274 281 236   Basic Metabolic Panel: Recent Labs  Lab 09/04/23 1503 09/04/23 2301 09/05/23 0143  NA 138  --  139  K 2.9*  --  3.4*  CL 100  --  100  CO2 29  --  27  GLUCOSE 103*  --  98  BUN 12  --  12  CREATININE 0.78 0.82 0.74  CALCIUM 8.9  --  8.6*  MG  --  1.8  --    GFR: Estimated Creatinine Clearance: 41.2 mL/min (by C-G formula based on SCr of 0.74 mg/dL). Liver Function Tests: Recent Labs  Lab 09/04/23 1503  AST 24  ALT 12  ALKPHOS 59  BILITOT 1.0  PROT 6.2*  ALBUMIN 3.3*   No results for input(s): "LIPASE", "AMYLASE" in the last 168 hours. No results for input(s): "AMMONIA" in the last 168 hours. Coagulation Profile: Recent Labs  Lab 09/05/23 0143  INR 1.7*   Cardiac Enzymes: No results for input(s): "CKTOTAL", "CKMB", "CKMBINDEX", "TROPONINI" in the last 168 hours. BNP (last 3 results) No results for input(s): "PROBNP" in the last 8760 hours. HbA1C: No results for input(s): "HGBA1C" in the last 72 hours. CBG: No results for input(s): "GLUCAP" in the last 168 hours. Lipid Profile: No results  for input(s): "CHOL", "HDL", "LDLCALC", "TRIG", "CHOLHDL", "LDLDIRECT" in the last 72 hours. Thyroid Function Tests: Recent Labs    09/05/23 0143  TSH 3.722   Anemia Panel: No results for input(s): "VITAMINB12", "FOLATE", "FERRITIN", "TIBC", "IRON", "RETICCTPCT" in the last 72 hours. Urine analysis:    Component Value Date/Time   COLORURINE YELLOW 10/31/2018 1041   APPEARANCEUR Sl Cloudy (A) 10/31/2018 1041   LABSPEC 1.020 10/31/2018 1041   PHURINE 5.5 10/31/2018 1041   GLUCOSEU NEGATIVE 10/31/2018 1041   HGBUR MODERATE (A) 10/31/2018 1041   BILIRUBINUR NEGATIVE 10/31/2018 1041   KETONESUR NEGATIVE 10/31/2018 1041   UROBILINOGEN 0.2 10/31/2018 1041   NITRITE NEGATIVE 10/31/2018 1041   LEUKOCYTESUR MODERATE (A) 10/31/2018 1041   Sepsis Labs: @LABRCNTIP (procalcitonin:4,lacticidven:4)  ) Recent Results (from the past 240 hour(s))  Resp panel by RT-PCR (RSV, Flu A&B, Covid) Anterior Nasal Swab     Status: None   Collection Time: 09/04/23  7:41 PM   Specimen: Anterior Nasal Swab  Result Value Ref Range Status   SARS Coronavirus 2 by RT PCR NEGATIVE NEGATIVE Final   Influenza A by PCR NEGATIVE NEGATIVE Final   Influenza B by PCR NEGATIVE NEGATIVE Final    Comment: (NOTE) The Xpert Xpress SARS-CoV-2/FLU/RSV plus assay is intended as an aid in the diagnosis of influenza from Nasopharyngeal swab specimens and should not be used as a sole basis for treatment. Nasal washings and aspirates are unacceptable for Xpert Xpress SARS-CoV-2/FLU/RSV testing.  Fact Sheet for Patients: BloggerCourse.com  Fact Sheet for Healthcare Providers: SeriousBroker.it  This test is not yet approved or cleared by the [place] FDA and has been authorized for detection and/or diagnosis of SARS-CoV-2 by FDA under an Emergency Use Authorization (EUA). This EUA will remain in effect (meaning this test can be used) for the duration of the COVID-19  declaration under Section 564(b)(1) of the Act, 21 U.S.C. section 360bbb-3(b)(1), unless the authorization is terminated or revoked.     Resp Syncytial Virus by PCR NEGATIVE NEGATIVE Final    Comment: (NOTE) Fact Sheet for Patients: BloggerCourse.com  Fact Sheet for Healthcare Providers: SeriousBroker.it  This test is not yet approved or cleared by the [place] FDA and has been authorized for detection and/or diagnosis of SARS-CoV-2 by FDA under an Emergency Use Authorization (EUA). This EUA will remain in effect (meaning this test can be used) for the duration of the COVID-19 declaration under Section 564(b)(1) of the Act, 21 U.S.C. section 360bbb-3(b)(1), unless the authorization is terminated or revoked.  Performed at [facility], [address], [place], [place] [address]      Radiology Studies: DG Chest 1 View  Result Date: 09/04/2023 CLINICAL DATA:  Shortness of breath and leg edema EXAM: CHEST  1 VIEW COMPARISON:  Chest radiograph 08/18/2022 FINDINGS: The heart is enlarged.  The upper mediastinal contours are stable There are moderate right  larger than left bilateral pleural effusions with associated airspace opacities in both lung bases. There is vascular congestion without definite overt edema. There is no pneumothorax There is no acute osseous abnormality. Left breast and axillary surgical clips are noted. IMPRESSION: Cardiomegaly with vascular congestion and moderate right larger than left pleural effusions. Electronically Signed   By: Lesia Hausen M.D.   On: 09/04/2023 15:51     Scheduled Meds:  anastrozole  1 mg Oral Daily   apixaban  5 mg Oral BID   cholecalciferol  2,000 Units Oral q morning   furosemide  40 mg Intravenous Daily   metoprolol succinate  25 mg Oral Daily   senna-docusate  1 tablet Oral BID   sodium chloride flush  3 mL Intravenous Q12H   Continuous Infusions:   LOS: 1 day    Time  spent:    Zannie Cove, MD [facility]   09/05/2023, 2:18 PM

## 2023-09-05 NOTE — ED Notes (Signed)
ED TO INPATIENT HANDOFF REPORT  ED Nurse Name and Phone #: [phone]  S Name/Age/Gender Molly Tucker 89 y.o. female Room/Bed: 043C/043C  Code Status   Code Status: Full Code  Home/SNF/Other Home Patient oriented to: self, place, time, and situation Is this baseline? Yes   Triage Complete: Triage complete  Chief Complaint CHF (congestive heart failure) (HCC) [I50.9]  Triage Note Pt sent by neurosurgeon for int SOB and leg edema [id number]. Pt has dyspnea w/exertion.Pt has 2+ edema of LE bilat.   Allergies No Known Allergies  Level of Care/Admitting Diagnosis ED Disposition     ED Disposition  Admit   Condition  --   Comment  Hospital Area: [facility] [[id number]]  Level of Care: Telemetry Cardiac [[id number]]  May admit patient to [facility] or [facility] if equivalent level of care is available:: No  Covid Evaluation: Asymptomatic - no recent exposure (last 10 days) testing not required  Diagnosis: CHF (congestive heart failure) [facility]) [[id number]]  Admitting Physician: Nolberto Hanlon [[id number]]  Attending Physician: Nolberto Hanlon [[id number]]  Certification:: I certify this patient will need inpatient services for at least 2 midnights          B Medical/Surgery History Past Medical History:  Diagnosis Date   Arthritis    Atrial tachycardia (HCC)    Breast cancer (HCC) 11/30/2020   Carotid bruit 2005   right, carotid doppler: <39% occlusion   Complication of anesthesia    Dysrhythmia    Chronic A. Fib   Family history of breast cancer    Family history of GI tract cancer    Family history of stomach cancer    Hyperlipidemia    LDL goal =<120   Hyperplastic colonic polyp 2006   Dr. Leone Payor, due 2016   Hypertension    Osteoporosis    S/P  biphosphonates x 5 yrs   Persistent atrial fibrillation (HCC)    PONV (postoperative nausea and vomiting)    Past Surgical History:  Procedure Laterality Date   BREAST LUMPECTOMY     BREAST LUMPECTOMY  WITH SENTINEL LYMPH NODE BIOPSY Left 12/23/2020   Procedure: LEFT BREAST LUMPECTOMY WITH SENTINEL LYMPH NODE BX;  Surgeon: Almond Lint, MD;  Location: [facility] OR;  Service: General;  Laterality: Left;  RNFA, PEC BLOCK; START TIME OF 1030 rm 2 per ivey   CARDIOVERSION N/A 03/31/2015   Procedure: CARDIOVERSION;  Surgeon: Thurmon Fair, MD;  Location: [facility] ENDOSCOPY;  Service: Cardiovascular;  Laterality: N/A;   CARDIOVERSION N/A 12/21/2015   Procedure: CARDIOVERSION;  Surgeon: Lewayne Bunting, MD;  Location: [facility] ENDOSCOPY;  Service: Cardiovascular;  Laterality: N/A;   CARDIOVERSION N/A 01/09/2017   Procedure: CARDIOVERSION;  Surgeon: Jake Bathe, MD;  Location: [facility] ENDOSCOPY;  Service: Cardiovascular;  Laterality: N/A;   CARDIOVERSION N/A 07/09/2019   Procedure: CARDIOVERSION;  Surgeon: Thurmon Fair, MD;  Location: [facility] ENDOSCOPY;  Service: Cardiovascular;  Laterality: N/A;   CARDIOVERSION N/A 01/14/2020   Procedure: CARDIOVERSION;  Surgeon: Sande Rives, MD;  Location: [facility] ENDOSCOPY;  Service: Cardiovascular;  Laterality: N/A;   CARDIOVERSION N/A 02/23/2020   Procedure: CARDIOVERSION;  Surgeon: Jake Bathe, MD;  Location: [facility] ENDOSCOPY;  Service: Cardiovascular;  Laterality: N/A;   COLONOSCOPY W/ POLYPECTOMY  10/30/2004   DILATION AND CURETTAGE OF UTERUS     MOUTH SURGERY  2009-2010   implants, Dr. Dereck Leep     A IV Location/Drains/Wounds Patient Lines/Drains/Airways Status     Active Line/Drains/Airways     Name Placement  date Placement time Site Days   Peripheral IV 09/04/23 20 G Right Antecubital 09/04/23  1947  Antecubital  1            Intake/Output Last 24 hours  Intake/Output Summary (Last 24 hours) at 09/05/2023 0517 Last data filed at 09/05/2023 0040 Gross per 24 hour  Intake 200 ml  Output --  Net 200 ml    Labs/Imaging Results for orders placed or performed during the hospital encounter of 09/04/23 (from the past 48 hour(s))  Comprehensive metabolic  panel     Status: Abnormal   Collection Time: 09/04/23  3:03 PM  Result Value Ref Range   Sodium 138 135 - 145 mmol/L   Potassium 2.9 (L) 3.5 - 5.1 mmol/L   Chloride 100 98 - 111 mmol/L   CO2 29 22 - 32 mmol/L   Glucose, Bld 103 (H) 70 - 99 mg/dL    Comment: Glucose reference range applies only to samples taken after fasting for at least 8 hours.   BUN 12 8 - 23 mg/dL   Creatinine, Ser 4.74 0.44 - 1.00 mg/dL   Calcium 8.9 8.9 - 25.9 mg/dL   Total Protein 6.2 (L) 6.5 - 8.1 g/dL   Albumin 3.3 (L) 3.5 - 5.0 g/dL   AST 24 15 - 41 U/L   ALT 12 0 - 44 U/L   Alkaline Phosphatase 59 38 - 126 U/L   Total Bilirubin 1.0 <1.2 mg/dL   GFR, Estimated >56 >38 mL/min    Comment: (NOTE) Calculated using the CKD-EPI Creatinine Equation (2021)    Anion gap 9 5 - 15    Comment: Performed at [facility], [address], [place], [place] [address]  CBC     Status: Abnormal   Collection Time: 09/04/23  3:03 PM  Result Value Ref Range   WBC 6.0 4.0 - 10.5 K/uL   RBC 3.32 (L) 3.87 - 5.11 MIL/uL   Hemoglobin 10.5 (L) 12.0 - 15.0 g/dL   HCT 32.9 (L) 51.8 - 84.1 %   MCV 99.7 80.0 - 100.0 fL   MCH 31.6 26.0 - 34.0 pg   MCHC 31.7 30.0 - 36.0 g/dL   RDW 66.0 63.0 - 16.0 %   Platelets 274 150 - 400 K/uL   nRBC 0.0 0.0 - 0.2 %    Comment: Performed at [facility], [address] [place], [place] [address]  Troponin I (High Sensitivity)     Status: Abnormal   Collection Time: 09/04/23  3:03 PM  Result Value Ref Range   Troponin I (High Sensitivity) 19 (H) <18 ng/L    Comment: (NOTE) Elevated high sensitivity troponin I (hsTnI) values and significant  changes across serial measurements may suggest ACS but many other  chronic and acute conditions are known to elevate hsTnI results.  Refer to the "Links" section for chest pain algorithms and additional  guidance. Performed at [facility], [address], [place], [place] [address]   Troponin I (High Sensitivity)     Status:  Abnormal   Collection Time: 09/04/23  5:03 PM  Result Value Ref Range   Troponin I (High Sensitivity) 24 (H) <18 ng/L    Comment: (NOTE) Elevated high sensitivity troponin I (hsTnI) values and significant  changes across serial measurements may suggest ACS but many other  chronic and acute conditions are known to elevate hsTnI results.  Refer to the "Links" section for chest pain algorithms and additional  guidance. Performed at  [facility], [address] [place] [place], [place] [address]   Brain natriuretic peptide     Status: Abnormal   Collection Time: 09/04/23  5:03 PM  Result Value Ref Range   B Natriuretic Peptide 521.9 (H) 0.0 - 100.0 pg/mL    Comment: Performed at [facility], [address] [place], [place] [address]  Resp panel by RT-PCR (RSV, Flu A&B, Covid) Anterior Nasal Swab     Status: None   Collection Time: 09/04/23  7:41 PM   Specimen: Anterior Nasal Swab  Result Value Ref Range   SARS Coronavirus 2 by RT PCR NEGATIVE NEGATIVE   Influenza A by PCR NEGATIVE NEGATIVE   Influenza B by PCR NEGATIVE NEGATIVE    Comment: (NOTE) The Xpert Xpress SARS-CoV-2/FLU/RSV plus assay is intended as an aid in the diagnosis of influenza from Nasopharyngeal swab specimens and should not be used as a sole basis for treatment. Nasal washings and aspirates are unacceptable for Xpert Xpress SARS-CoV-2/FLU/RSV testing.  Fact Sheet for Patients: BloggerCourse.com  Fact Sheet for Healthcare Providers: SeriousBroker.it  This test is not yet approved or cleared by the [place] FDA and has been authorized for detection and/or diagnosis of SARS-CoV-2 by FDA under an Emergency Use Authorization (EUA). This EUA will remain in effect (meaning this test can be used) for the duration of the COVID-19 declaration under Section 564(b)(1) of the Act, 21 U.S.C. section 360bbb-3(b)(1), unless the authorization is terminated  or revoked.     Resp Syncytial Virus by PCR NEGATIVE NEGATIVE    Comment: (NOTE) Fact Sheet for Patients: BloggerCourse.com  Fact Sheet for Healthcare Providers: SeriousBroker.it  This test is not yet approved or cleared by the [place] FDA and has been authorized for detection and/or diagnosis of SARS-CoV-2 by FDA under an Emergency Use Authorization (EUA). This EUA will remain in effect (meaning this test can be used) for the duration of the COVID-19 declaration under Section 564(b)(1) of the Act, 21 U.S.C. section 360bbb-3(b)(1), unless the authorization is terminated or revoked.  Performed at [facility], [address] [place], [place] [address]   CBC     Status: Abnormal   Collection Time: 09/04/23 11:01 PM  Result Value Ref Range   WBC 6.3 4.0 - 10.5 K/uL   RBC 3.39 (L) 3.87 - 5.11 MIL/uL   Hemoglobin 11.0 (L) 12.0 - 15.0 g/dL   HCT 56.4 (L) 33.2 - 95.1 %   MCV 100.6 (H) 80.0 - 100.0 fL   MCH 32.4 26.0 - 34.0 pg   MCHC 32.3 30.0 - 36.0 g/dL   RDW 88.4 16.6 - 06.3 %   Platelets 281 150 - 400 K/uL   nRBC 0.0 0.0 - 0.2 %    Comment: Performed at [facility], [address], [place], [place] [address]  Creatinine, serum     Status: None   Collection Time: 09/04/23 11:01 PM  Result Value Ref Range   Creatinine, Ser 0.82 0.44 - 1.00 mg/dL   GFR, Estimated >09 >32 mL/min    Comment: (NOTE) Calculated using the CKD-EPI Creatinine Equation (2021) Performed at [facility], [address], [place], [place] [address]   Magnesium     Status: None   Collection Time: 09/04/23 11:01 PM  Result Value Ref Range   Magnesium 1.8 1.7 - 2.4 mg/dL    Comment: Performed at [facility], [address] [place], [place] [address]  APTT  Status: None   Collection Time: 09/05/23  1:43 AM  Result Value Ref Range   aPTT 36 24 - 36 seconds    Comment: Performed at [facility], [address] [place], [place] [address]  Protime-INR     Status: Abnormal   Collection Time: 09/05/23  1:43 AM  Result Value Ref Range   Prothrombin Time 20.4 (H) 11.4 - 15.2 seconds   INR 1.7 (H) 0.8 - 1.2    Comment: (NOTE) INR goal varies based on device and disease states. Performed at [facility], [address], [place], [place] [address]   Basic metabolic panel     Status: Abnormal   Collection Time: 09/05/23  1:43 AM  Result Value Ref Range   Sodium 139 135 - 145 mmol/L   Potassium 3.4 (L) 3.5 - 5.1 mmol/L   Chloride 100 98 - 111 mmol/L   CO2 27 22 - 32 mmol/L   Glucose, Bld 98 70 - 99 mg/dL    Comment: Glucose reference range applies only to samples taken after fasting for at least 8 hours.   BUN 12 8 - 23 mg/dL   Creatinine, Ser 0.98 0.44 - 1.00 mg/dL   Calcium 8.6 (L) 8.9 - 10.3 mg/dL   GFR, Estimated >11 >91 mL/min    Comment: (NOTE) Calculated using the CKD-EPI Creatinine Equation (2021)    Anion gap 12 5 - 15    Comment: Performed at [facility], [address], [place], [place] [address]  CBC     Status: Abnormal   Collection Time: 09/05/23  1:43 AM  Result Value Ref Range   WBC 5.8 4.0 - 10.5 K/uL   RBC 3.13 (L) 3.87 - 5.11 MIL/uL   Hemoglobin 9.8 (L) 12.0 - 15.0 g/dL   HCT 56.2 (L) 13.0 - 86.5 %   MCV 99.7 80.0 - 100.0 fL   MCH 31.3 26.0 - 34.0 pg   MCHC 31.4 30.0 - 36.0 g/dL   RDW 78.4 69.6 - 29.5 %   Platelets 236 150 - 400 K/uL   nRBC 0.0 0.0 - 0.2 %    Comment: Performed at [facility], [address] [place], [place] [address]  TSH     Status: None   Collection Time: 09/05/23  1:43 AM  Result Value Ref Range   TSH 3.722 0.350 - 4.500 uIU/mL    Comment: Performed by a 3rd Generation assay with a functional sensitivity of <=0.01 uIU/mL. Performed at [facility], [address], [place], [place] [address]    DG Chest 1 View  Result Date: 09/04/2023 CLINICAL DATA:  Shortness of breath and leg edema EXAM: CHEST  1 VIEW COMPARISON:   Chest radiograph 08/18/2022 FINDINGS: The heart is enlarged.  The upper mediastinal contours are stable There are moderate right larger than left bilateral pleural effusions with associated airspace opacities in both lung bases. There is vascular congestion without definite overt edema. There is no pneumothorax There is no acute osseous abnormality. Left breast and axillary surgical clips are noted. IMPRESSION: Cardiomegaly with vascular congestion and moderate right larger than left pleural effusions. Electronically Signed   By: Lesia Hausen M.D.   On: 09/04/2023 15:51    Pending Labs Unresulted Labs (From admission, onward)     Start     Ordered   09/11/23 0500  Creatinine, serum  (enoxaparin (LOVENOX)    CrCl >/= 30 ml/min)  Weekly,   R  Comments: while on enoxaparin therapy    09/04/23 2202            Vitals/Pain Today's Vitals   09/04/23 2345 09/05/23 0100 09/05/23 0500 09/05/23 0515  BP:  (!) 151/91 (!) 153/81 (!) 153/81  Pulse:  86 82 98  Resp:  (!) 21 17 (!) 21  Temp:  98.7 F (37.1 C)  98.1 F (36.7 C)  TempSrc:  Oral  Oral  SpO2:  95% 100% 100%  Weight:      Height:      PainSc: 7        Isolation Precautions No active isolations  Medications Medications  acetaminophen (TYLENOL) tablet 650 mg (has no administration in time range)    Or  acetaminophen (TYLENOL) suppository 650 mg (has no administration in time range)  polyethylene glycol (MIRALAX / GLYCOLAX) packet 17 g (has no administration in time range)  sodium chloride flush (NS) 0.9 % injection 3 mL (3 mLs Intravenous Given 09/04/23 2303)  potassium chloride (KLOR-CON) packet 40 mEq (40 mEq Oral Given 09/05/23 0157)  furosemide (LASIX) injection 40 mg (has no administration in time range)  HYDROcodone-acetaminophen (NORCO/VICODIN) 5-325 MG per tablet 1 tablet (1 tablet Oral Given 09/04/23 2346)  apixaban (ELIQUIS) tablet 5 mg (5 mg Oral Given 09/04/23 2302)  anastrozole (ARIMIDEX) tablet 1 mg (has no  administration in time range)  metoprolol succinate (TOPROL-XL) 24 hr tablet 25 mg (has no administration in time range)  cholecalciferol (VITAMIN D3) 25 MCG (1000 UNIT) tablet 2,000 Units (has no administration in time range)  fentaNYL (SUBLIMAZE) injection 50 mcg (50 mcg Intravenous Given 09/04/23 1949)  potassium chloride 10 mEq in 100 mL IVPB (0 mEq Intravenous Stopped 09/05/23 0040)  furosemide (LASIX) injection 40 mg (40 mg Intravenous Given 09/04/23 2058)    Mobility walks     Focused Assessments   R Recommendations: See Admitting Provider Note  Report given to:   Additional Notes: GCS 15. Takes pills whole. Alert and oriented. Ambulatory. Pleasant. Diuresed with lasix.

## 2023-09-05 NOTE — Progress Notes (Signed)
Echocardiogram 2D Echocardiogram has been performed.  Warren Lacy Murrel Bertram RDCS 09/05/2023, 2:26 PM

## 2023-09-05 NOTE — Plan of Care (Signed)

## 2023-09-06 DIAGNOSIS — I5030 Unspecified diastolic (congestive) heart failure: Secondary | ICD-10-CM | POA: Diagnosis not present

## 2023-09-06 LAB — CBC
HCT: 30.9 % — ABNORMAL LOW (ref 36.0–46.0)
Hemoglobin: 9.9 g/dL — ABNORMAL LOW (ref 12.0–15.0)
MCH: 31.8 pg (ref 26.0–34.0)
MCHC: 32 g/dL (ref 30.0–36.0)
MCV: 99.4 fL (ref 80.0–100.0)
Platelets: 231 10*3/uL (ref 150–400)
RBC: 3.11 MIL/uL — ABNORMAL LOW (ref 3.87–5.11)
RDW: 14.1 % (ref 11.5–15.5)
WBC: 4.7 10*3/uL (ref 4.0–10.5)
nRBC: 0 % (ref 0.0–0.2)

## 2023-09-06 LAB — BASIC METABOLIC PANEL
Anion gap: 8 (ref 5–15)
BUN: 15 mg/dL (ref 8–23)
CO2: 31 mmol/L (ref 22–32)
Calcium: 8.4 mg/dL — ABNORMAL LOW (ref 8.9–10.3)
Chloride: 100 mmol/L (ref 98–111)
Creatinine, Ser: 0.92 mg/dL (ref 0.44–1.00)
GFR, Estimated: 60 mL/min — ABNORMAL LOW (ref >60)
Glucose, Bld: 87 mg/dL (ref 70–99)
Potassium: 3.6 mmol/L (ref 3.5–5.1)
Sodium: 139 mmol/L (ref 135–145)

## 2023-09-06 MED ORDER — POTASSIUM CHLORIDE CRYS ER 20 MEQ PO TBCR
40.0000 meq | EXTENDED_RELEASE_TABLET | Freq: Once | ORAL | Status: AC
  Administered 2023-09-06: 40 meq via ORAL
  Filled 2023-09-06: qty 2

## 2023-09-06 MED ORDER — SENNOSIDES-DOCUSATE SODIUM 8.6-50 MG PO TABS: 2.0000 | ORAL_TABLET | Freq: Every day | ORAL | 0 refills | Status: DC

## 2023-09-06 MED ORDER — FUROSEMIDE 40 MG PO TABS: 40.0000 mg | ORAL_TABLET | Freq: Every day | ORAL | 0 refills | Status: DC

## 2023-09-06 MED ORDER — POTASSIUM CHLORIDE CRYS ER 20 MEQ PO TBCR: 40.0000 meq | EXTENDED_RELEASE_TABLET | Freq: Every day | ORAL | 0 refills | Status: DC

## 2023-09-06 MED ORDER — FUROSEMIDE 10 MG/ML IJ SOLN
INTRAMUSCULAR | Status: AC
  Filled 2023-09-06: qty 4

## 2023-09-06 MED ORDER — FUROSEMIDE 10 MG/ML IJ SOLN
40.0000 mg | Freq: Once | INTRAMUSCULAR | Status: AC
  Administered 2023-09-06: 40 mg via INTRAVENOUS
  Filled 2023-09-06: qty 4

## 2023-09-06 NOTE — Discharge Summary (Signed)
Physician Discharge Summary  Molly Tucker ZOX:[id number] DOB: January 08, 1932 DOA: 09/04/2023  PCP: Pincus Sanes, MD  Admit date: 09/04/2023 Discharge date: 09/06/2023  Time spent:45 minutes  Recommendations for Outpatient Follow-up:  PCP in 1 week, please check BMP at follow-up Cardiology Dr. Graciela Husbands in 1 month   Discharge Diagnoses:  Principal Problem:   Acute on chronic diastolic CHF Permanent A-fib   Leg cramps   Compression fracture of thoracic vertebra (HCC)   Hypokalemia   Discharge Condition: Improved  Diet recommendation: Low-salt, heart healthy  Filed Weights   09/04/23 1500 09/05/23 0635 09/06/23 0356  Weight: 64.9 kg 60.8 kg 59.1 kg    History of present illness:  89/F with history of permanent A-fib, diastolic CHF presented to the ED with lower extremity edema which she noticed 1 to 2 weeks ago, in addition also reported mild dyspnea on exertion.  Her Lasix dose was increased a few weeks ago.  Was at the neurosurgeons office for follow-up of thoracic vertebral fracture which occurred 2 months ago, neurosurgeon was concerned about patient's swelling and dyspnea on exertion and she was sent to the ED Vital signs stable, rate controlled A-fib, labs noted creatinine 0.7, potassium 2.9, BNP 521, hemoglobin 10.5, chest x-ray noted cardiomegaly, pleural effusions, moderate on the right  Hospital Course:   Acute on chronic diastolic CHF -Last echo 10/22 with EF 70-75%, normal RV -Admitted with volume overload and pleural effusions, diuresed with IV Lasix with good clinical response -To oral Lasix at discharge, metoprolol continued -She was reluctant to consider GDMT on account of advanced age, she will discuss this with cardiology Dr. Graciela Husbands at follow-up -Discharged home in a stable condition, advised dietary changes as well   Permanent A-fib -Reportedly has had multiple cardioversions, currently on rate control strategy -Continue Toprol and apixaban    Hypokalemia Hypomagnesemia -Replaced   Compression fracture of thoracic vertebra (HCC) This is recent, within the last couple of months, continue with hydrocodone, add Senokot   Chronic anemia Monitor  Discharge Exam: Vitals:   09/06/23 0356 09/06/23 0752  BP: (!) 146/85 (!) 143/84  Pulse: 80 90  Resp: 16   Temp: 98.2 F (36.8 C) 98.1 F (36.7 C)  SpO2:  91%   Gen: Awake, Alert, Oriented X 3,  HEENT: no JVD Lungs: decreased BS at bases CVS: S1S2/Irregular Abd: soft, Non tender, non distended, BS present Extremities: No edema Skin: no new rashes on exposed skin   Discharge Instructions   Discharge Instructions     Diet - low sodium heart healthy   Complete by: As directed    Increase activity slowly   Complete by: As directed       Allergies as of 09/06/2023   No Known Allergies      Medication List     STOP taking these medications    ibuprofen 200 MG tablet Commonly known as: ADVIL       TAKE these medications    acetaminophen 500 MG tablet Commonly known as: TYLENOL Take 500 mg by mouth every 6 (six) hours as needed for moderate pain.   anastrozole 1 MG tablet Commonly known as: ARIMIDEX Take 1 tablet (1 mg total) by mouth daily.   Eliquis 5 MG Tabs tablet Generic drug: apixaban TAKE 1 TABLET BY MOUTH TWICE  DAILY What changed: how much to take   furosemide 40 MG tablet Commonly known as: LASIX Take 1 tablet (40 mg total) by mouth daily. What changed:  when to take this reasons  to take this   HYDROcodone-acetaminophen 5-325 MG tablet Commonly known as: Norco Take 1 tablet by mouth every 8 (eight) hours as needed (chronic back pain).   metoprolol succinate 25 MG 24 hr tablet Commonly known as: TOPROL-XL TAKE 1 TABLET BY MOUTH ONCE  DAILY   potassium chloride SA 20 MEQ tablet Commonly known as: KLOR-CON M Take 2 tablets (40 mEq total) by mouth daily.   senna-docusate 8.6-50 MG tablet Commonly known as: Senokot-S Take 2  tablets by mouth at bedtime.   Vitamin D 50 MCG (2000 UT) tablet Take 2,000 Units by mouth every morning.       No Known Allergies    The results of significant diagnostics from this hospitalization (including imaging, microbiology, ancillary and laboratory) are listed below for reference.    Significant Diagnostic Studies: ECHOCARDIOGRAM COMPLETE  Result Date: 09/05/2023    ECHOCARDIOGRAM REPORT   Patient Name:   Molly Tucker Date of Exam: 09/05/2023 Medical Rec #:  [id number]       Height:       64.0 in Accession #:    [id number]      Weight:       134.0 lb Date of Birth:  03-29-1932       BSA:          1.650 m Patient Age:    89 years        BP:           126/76 mmHg Patient Gender: F               HR:           86 bpm. Exam Location:  Inpatient Procedure: 2D Echo, Color Doppler and Cardiac Doppler Indications:    I50.31 Acute diastolic (congestive) heart failure  History:        Patient has prior history of Echocardiogram examinations, most                 recent 08/23/2021. CHF, Arrythmias:Atrial Fibrillation; Risk                 Factors:Hypertension and Dyslipidemia.  Sonographer:    Irving Burton Senior RDCS Referring Phys: [facility] GOEL IMPRESSIONS  1. Left ventricular ejection fraction, by estimation, is 65 to 70%. The left ventricle has normal function. The left ventricle has no regional wall motion abnormalities. Left ventricular diastolic parameters are indeterminate.  2. Right ventricular systolic function is mildly reduced. The right ventricular size is mildly enlarged. There is moderately elevated pulmonary artery systolic pressure. The estimated right ventricular systolic pressure is 49.0 mmHg.  3. Left atrial size was severely dilated.  4. Right atrial size was severely dilated.  5. The mitral valve is normal in structure. Trivial mitral valve regurgitation.  6. The tricuspid valve is abnormal. Tricuspid valve regurgitation is severe.  7. The aortic valve is tricuspid. Aortic valve  regurgitation is not visualized. Aortic valve sclerosis is present, with no evidence of aortic valve stenosis.  8. The inferior vena cava is normal in size with <50% respiratory variability, suggesting right atrial pressure of 8 mmHg. FINDINGS  Left Ventricle: Left ventricular ejection fraction, by estimation, is 65 to 70%. The left ventricle has normal function. The left ventricle has no regional wall motion abnormalities. The left ventricular internal cavity size was small. There is no left ventricular hypertrophy. Left ventricular diastolic parameters are indeterminate. Right Ventricle: The right ventricular size is mildly enlarged. No increase in right ventricular wall thickness. Right ventricular systolic  function is mildly reduced. There is moderately elevated pulmonary artery systolic pressure. The tricuspid regurgitant velocity is 3.20 m/s, and with an assumed right atrial pressure of 8 mmHg, the estimated right ventricular systolic pressure is 49.0 mmHg. Left Atrium: Left atrial size was severely dilated. Right Atrium: Right atrial size was severely dilated. Pericardium: There is no evidence of pericardial effusion. Mitral Valve: The mitral valve is normal in structure. Trivial mitral valve regurgitation. Tricuspid Valve: The tricuspid valve is abnormal. Tricuspid valve regurgitation is severe. Aortic Valve: The aortic valve is tricuspid. Aortic valve regurgitation is not visualized. Aortic valve sclerosis is present, with no evidence of aortic valve stenosis. Pulmonic Valve: The pulmonic valve was grossly normal. Pulmonic valve regurgitation is trivial. Aorta: The aortic root and ascending aorta are structurally normal, with no evidence of dilitation. Venous: The inferior vena cava is normal in size with less than 50% respiratory variability, suggesting right atrial pressure of 8 mmHg. IAS/Shunts: The interatrial septum was not well visualized.  LEFT VENTRICLE PLAX 2D LVIDd:         3.30 cm LVIDs:          1.90 cm LV PW:         0.90 cm LV IVS:        0.90 cm LVOT diam:     1.90 cm LV SV:         43 LV SV Index:   26 LVOT Area:     2.84 cm  RIGHT VENTRICLE RV S prime:     13.20 cm/s TAPSE (M-mode): 2.1 cm LEFT ATRIUM              Index        RIGHT ATRIUM           Index LA diam:        4.70 cm  2.85 cm/m   RA Area:     54.80 cm LA Vol (A2C):   128.0 ml 77.57 ml/m  RA Volume:   260.00 ml 157.56 ml/m LA Vol (A4C):   139.0 ml 84.23 ml/m LA Biplane Vol: 135.0 ml 81.81 ml/m  AORTIC VALVE LVOT Vmax:   70.00 cm/s LVOT Vmean:  53.900 cm/s LVOT VTI:    0.151 m  AORTA Ao Root diam: 2.60 cm Ao Asc diam:  3.20 cm TRICUSPID VALVE TR Peak grad:   41.0 mmHg TR Vmax:        320.00 cm/s  SHUNTS Systemic VTI:  0.15 m Systemic Diam: 1.90 cm Epifanio Lesches MD Electronically signed by Epifanio Lesches MD Signature Date/Time: 09/05/2023/5:13:43 PM    Final    DG Chest 1 View  Result Date: 09/04/2023 CLINICAL DATA:  Shortness of breath and leg edema EXAM: CHEST  1 VIEW COMPARISON:  Chest radiograph 08/18/2022 FINDINGS: The heart is enlarged.  The upper mediastinal contours are stable There are moderate right larger than left bilateral pleural effusions with associated airspace opacities in both lung bases. There is vascular congestion without definite overt edema. There is no pneumothorax There is no acute osseous abnormality. Left breast and axillary surgical clips are noted. IMPRESSION: Cardiomegaly with vascular congestion and moderate right larger than left pleural effusions. Electronically Signed   By: Lesia Hausen M.D.   On: 09/04/2023 15:51   DG Lumbar Spine Complete  Result Date: 08/08/2023 CLINICAL DATA:  Back pain EXAM: LUMBAR SPINE - COMPLETE 4 VIEW; THORACIC SPINE 2 VIEWS COMPARISON:  Chest radiograph dated February 22, 2015 FINDINGS: Thoracic: Age-indeterminate severe compression fracture of  T6. Moderate compression fracture of T9, unchanged when compared with the prior. New mild compression fracture of  T12. Alignment is normal. Moderate multilevel degenerative disc disease. Cardiomegaly. New triangular opacity of the right lung base. Trace bilateral pleural effusions. Lumbar: Mild age-indeterminate compression fracture of L3. Mild dextrocurvature of the lumbar spine with rotatory component. Moderate multilevel facet arthropathy, most pronounced in the lower lumbar spine. Multilevel moderate degenerative disc disease. Aortoiliac vascular calcifications. IMPRESSION: 1. Multilevel age-indeterminate compression fractures of the thoracic and lumbar spine, a severe compression fracture is seen at the T6 level. Consider cross-sectional imaging for further evaluation. 2. New triangular opacity of the right lung base, concerning for atelectasis or infection. Recommend PA and lateral chest radiograph for further evaluation. 3. Cardiomegaly and trace bilateral pleural effusions. Electronically Signed   By: Allegra Lai M.D.   On: 08/08/2023 17:22   DG Thoracic Spine 2 View  Result Date: 08/08/2023 CLINICAL DATA:  Back pain EXAM: LUMBAR SPINE - COMPLETE 4 VIEW; THORACIC SPINE 2 VIEWS COMPARISON:  Chest radiograph dated February 22, 2015 FINDINGS: Thoracic: Age-indeterminate severe compression fracture of T6. Moderate compression fracture of T9, unchanged when compared with the prior. New mild compression fracture of T12. Alignment is normal. Moderate multilevel degenerative disc disease. Cardiomegaly. New triangular opacity of the right lung base. Trace bilateral pleural effusions. Lumbar: Mild age-indeterminate compression fracture of L3. Mild dextrocurvature of the lumbar spine with rotatory component. Moderate multilevel facet arthropathy, most pronounced in the lower lumbar spine. Multilevel moderate degenerative disc disease. Aortoiliac vascular calcifications. IMPRESSION: 1. Multilevel age-indeterminate compression fractures of the thoracic and lumbar spine, a severe compression fracture is seen at the T6 level.  Consider cross-sectional imaging for further evaluation. 2. New triangular opacity of the right lung base, concerning for atelectasis or infection. Recommend PA and lateral chest radiograph for further evaluation. 3. Cardiomegaly and trace bilateral pleural effusions. Electronically Signed   By: Allegra Lai M.D.   On: 08/08/2023 17:22    Microbiology: Recent Results (from the past 240 hour(s))  Resp panel by RT-PCR (RSV, Flu A&B, Covid) Anterior Nasal Swab     Status: None   Collection Time: 09/04/23  7:41 PM   Specimen: Anterior Nasal Swab  Result Value Ref Range Status   SARS Coronavirus 2 by RT PCR NEGATIVE NEGATIVE Final   Influenza A by PCR NEGATIVE NEGATIVE Final   Influenza B by PCR NEGATIVE NEGATIVE Final    Comment: (NOTE) The Xpert Xpress SARS-CoV-2/FLU/RSV plus assay is intended as an aid in the diagnosis of influenza from Nasopharyngeal swab specimens and should not be used as a sole basis for treatment. Nasal washings and aspirates are unacceptable for Xpert Xpress SARS-CoV-2/FLU/RSV testing.  Fact Sheet for Patients: BloggerCourse.com  Fact Sheet for Healthcare Providers: SeriousBroker.it  This test is not yet approved or cleared by the [place] FDA and has been authorized for detection and/or diagnosis of SARS-CoV-2 by FDA under an Emergency Use Authorization (EUA). This EUA will remain in effect (meaning this test can be used) for the duration of the COVID-19 declaration under Section 564(b)(1) of the Act, 21 U.S.C. section 360bbb-3(b)(1), unless the authorization is terminated or revoked.     Resp Syncytial Virus by PCR NEGATIVE NEGATIVE Final    Comment: (NOTE) Fact Sheet for Patients: BloggerCourse.com  Fact Sheet for Healthcare Providers: SeriousBroker.it  This test is not yet approved or cleared by the [place] FDA and has been authorized for  detection and/or diagnosis of SARS-CoV-2 by FDA under an  Emergency Use Authorization (EUA). This EUA will remain in effect (meaning this test can be used) for the duration of the COVID-19 declaration under Section 564(b)(1) of the Act, 21 U.S.C. section 360bbb-3(b)(1), unless the authorization is terminated or revoked.  Performed at [facility], [address] [place], [place] [address]      Labs: Basic Metabolic Panel: Recent Labs  Lab 09/04/23 1503 09/04/23 2301 09/05/23 0143 09/06/23 0505  NA 138  --  139 139  K 2.9*  --  3.4* 3.6  CL 100  --  100 100  CO2 29  --  27 31  GLUCOSE 103*  --  98 87  BUN 12  --  12 15  CREATININE 0.78 0.82 0.74 0.92  CALCIUM 8.9  --  8.6* 8.4*  MG  --  1.8  --   --    Liver Function Tests: Recent Labs  Lab 09/04/23 1503  AST 24  ALT 12  ALKPHOS 59  BILITOT 1.0  PROT 6.2*  ALBUMIN 3.3*   No results for input(s): "LIPASE", "AMYLASE" in the last 168 hours. No results for input(s): "AMMONIA" in the last 168 hours. CBC: Recent Labs  Lab 09/04/23 1503 09/04/23 2301 09/05/23 0143 09/06/23 0505  WBC 6.0 6.3 5.8 4.7  HGB 10.5* 11.0* 9.8* 9.9*  HCT 33.1* 34.1* 31.2* 30.9*  MCV 99.7 100.6* 99.7 99.4  PLT 274 281 236 231   Cardiac Enzymes: No results for input(s): "CKTOTAL", "CKMB", "CKMBINDEX", "TROPONINI" in the last 168 hours. BNP: BNP (last 3 results) Recent Labs    09/04/23 1703  BNP 521.9*    ProBNP (last 3 results) No results for input(s): "PROBNP" in the last 8760 hours.  CBG: No results for input(s): "GLUCAP" in the last 168 hours.     Signed:  Zannie Cove MD.  [facility] 09/06/2023, 9:56 AM

## 2023-09-06 NOTE — Plan of Care (Signed)

## 2023-09-06 NOTE — Progress Notes (Signed)
Heart Failure Navigator Progress Note  Assessed for Heart & Vascular TOC clinic readiness.  Patient does not meet criteria due to EF 65-70%, No HF TOC per Dr.Joseph. .   Navigator will sign off at this time.   Rhae Hammock, BSN, Scientist, clinical (histocompatibility and immunogenetics) Only

## 2023-09-07 ENCOUNTER — Telehealth: Payer: Self-pay

## 2023-09-07 NOTE — Transitions of Care (Post Inpatient/ED Visit) (Signed)
09/07/2023  Name: Molly Tucker MRN: [id number] DOB: 08-Mar-1932  Today's TOC FU Call Status: Today's TOC FU Call Status:: Successful TOC FU Call Completed TOC FU Call Complete Date: 09/07/23 Patient's Name and Date of Birth confirmed.  Transition Care Management Follow-up Telephone Call Date of Discharge: 09/06/23 Discharge [facility]) Type of Discharge: Inpatient Admission Primary Inpatient Discharge Diagnosis:: Congestive Heart Failure How have you been since you were released from the hospital?: Better (Patient doing well, in good spirits.  Notes her edema has gone down and she is wearing her compression stockings) Any questions or concerns?: No  Items Reviewed: Did you receive and understand the discharge instructions provided?: Yes Medications obtained,verified, and reconciled?: Yes (Medications Reviewed) Any new allergies since your discharge?: No Dietary orders reviewed?: Yes Type of Diet Ordered:: Low sodium, heart healthy Do you have support at home?: Yes People in Home: child(ren), adult Name of Support/Comfort Primary Source: Ria Comment, friends  Medications Reviewed Today: Medications Reviewed Today     Reviewed by Jodelle Gross, RN (Case Manager) on 09/07/23 at 1405  Med List Status: <None>   Medication Order Taking? Sig Documenting Provider Last Dose Status Informant  acetaminophen (TYLENOL) 500 MG tablet [id number] Yes Take 500 mg by mouth every 6 (six) hours as needed for moderate pain. [provider] Taking Active Self           Med Note Neil Crouch, SEBASTIAN   Tue Sep 04, 2023  9:16 PM) Patient reports that she sometimes takes 1000mg  up to three times daily in addition to her hydrocodone-apap. Informed patient to be cautious of daily acetaminophen intake, alerting care team.   anastrozole (ARIMIDEX) 1 MG tablet [id number] Yes Take 1 tablet (1 mg total) by mouth daily. Rachel Moulds, MD Taking Active Self  Cholecalciferol (VITAMIN D) 50  MCG (2000 UT) tablet [id number] Yes Take 2,000 Units by mouth every morning. [provider] Taking Active Self  ELIQUIS 5 MG TABS tablet [id number] Yes TAKE 1 TABLET BY MOUTH TWICE  DAILY  Patient taking differently: Take 5 mg by mouth 2 (two) times daily.   Duke Salvia, MD Taking Active Self  furosemide (LASIX) 40 MG tablet [id number] Yes Take 1 tablet (40 mg total) by mouth daily. Zannie Cove, MD Taking Active   HYDROcodone-acetaminophen [facility]) 5-325 MG tablet [id number] Yes Take 1 tablet by mouth every 8 (eight) hours as needed (chronic back pain). Pincus Sanes, MD Taking Active Self  metoprolol succinate (TOPROL-XL) 25 MG 24 hr tablet [id number] Yes TAKE 1 TABLET BY MOUTH ONCE  DAILY Duke Salvia, MD Taking Active Self  potassium chloride SA (KLOR-CON M) 20 MEQ tablet [id number] Yes Take 2 tablets (40 mEq total) by mouth daily. Zannie Cove, MD Taking Active   senna-docusate (SENOKOT-S) 8.6-50 MG tablet [id number] Yes Take 2 tablets by mouth at bedtime. Zannie Cove, MD Taking Active             Home Care and Equipment/Supplies: Were Home Health Services Ordered?: No Any new equipment or medical supplies ordered?: No  Functional Questionnaire: Do you need assistance with bathing/showering or dressing?: No Do you need assistance with meal preparation?: No Do you need assistance with eating?: No Do you have difficulty maintaining continence: No Do you need assistance with getting out of bed/getting out of a chair/moving?: No Do you have difficulty managing or taking your medications?: No  Follow up appointments reviewed: PCP Follow-up appointment confirmed?: No MD Provider Line Number:[phone] Given: No (Patient prefers  to make call/appointment) Specialist Hospital Follow-up appointment confirmed?: No Reason Specialist Follow-Up Not Confirmed: Patient has Specialist Provider Number and will Call for Appointment Do you need transportation to your follow-up  appointment?: No Do you understand care options if your condition(s) worsen?: Yes-patient verbalized understanding  SDOH Interventions Today    Flowsheet Row Most Recent Value  SDOH Interventions   Food Insecurity Interventions Intervention Not Indicated  Transportation Interventions Intervention Not Indicated  Utilities Interventions Intervention Not Indicated     Jodelle Gross RN, BSN, CCM RN Care Manager  Transitions of Care  [facility]  [phone]

## 2023-09-16 ENCOUNTER — Encounter: Payer: Self-pay | Admitting: Internal Medicine

## 2023-09-16 DIAGNOSIS — M549 Dorsalgia, unspecified: Secondary | ICD-10-CM

## 2023-09-17 ENCOUNTER — Encounter: Payer: Self-pay | Admitting: Internal Medicine

## 2023-09-17 DIAGNOSIS — I5033 Acute on chronic diastolic (congestive) heart failure: Secondary | ICD-10-CM | POA: Insufficient documentation

## 2023-09-17 DIAGNOSIS — I5032 Chronic diastolic (congestive) heart failure: Secondary | ICD-10-CM | POA: Insufficient documentation

## 2023-09-17 MED ORDER — HYDROCODONE-ACETAMINOPHEN 5-325 MG PO TABS
1.0000 | ORAL_TABLET | Freq: Three times a day (TID) | ORAL | 0 refills | Status: DC | PRN
Start: 2023-09-17 — End: 2023-11-20

## 2023-09-17 MED ORDER — CLONAZEPAM 0.5 MG PO TABS: 0.2500 mg | ORAL_TABLET | Freq: Every evening | ORAL | 0 refills | Status: DC | PRN

## 2023-09-17 NOTE — Patient Instructions (Incomplete)
      Blood work was ordered.       Medications changes include :   decrease lasix to 20 mg daily and decrease potassium to 20 mg daily   A referral was ordered for the heart failure clinic.      Return in about 2 months (around 11/18/2023) for follow up.

## 2023-09-17 NOTE — Progress Notes (Unsigned)
Subjective:    Patient ID: Molly Tucker, female    DOB: 1932/01/22, 89 y.o.   MRN: [id number]     HPI Molly Tucker is here for follow up of her chronic medical problems.  Recently admitted for acute on chronic HFpEF-admitted 11/6-11/7  She presented to the ED with lower extremity edema x 1-2 weeks and mild dyspnea on exertion.  Lasix dose was increased a few weeks ago.  She was at her neurosurgeons office and he was concerned about the swelling and dyspnea on exertion and advised that she go to the ED.  Vital signs stable.  Rate controlled A-fib.  Potassium was low at 2.9, BNP 521, hemoglobin 10.5.  Chest x-ray showed cardiomegaly, pleural effusions moderate on the right.  Acute on chronic HFpEF: Last echo 2022 EF 70-75%, normal RV Repeat echo-see results below Admitted with volume overload, pleural effusions IV Lasix with good clinical response Started on oral Lasix on discharge Metoprolol continued  Permanent A-fib: History of multiple conversions Rate controlled Continue metoprolol, Eliquis  Hypokalemia, hypomagnesemia: Replaced  Compression fracture of thoracic vertebrae: Following with neurosurgery On hydrocodone  She weighs every morning - she has been 123  consistently +/- 1 lb.  She is taking lasix 40 mg daily.  She denies any current swelling.      Back pain - pain varies day to day.  Taking tylenol or hydrocodone.  Seeing neurosurgery.  Taking Senakot daily for constipation, which is controlled.    Medications and allergies reviewed with patient and updated if appropriate.  Current Outpatient Medications on File Prior to Visit  Medication Sig Dispense Refill   acetaminophen (TYLENOL) 500 MG tablet Take 500 mg by mouth every 6 (six) hours as needed for moderate pain.     anastrozole (ARIMIDEX) 1 MG tablet Take 1 tablet (1 mg total) by mouth daily. 90 tablet 3   Cholecalciferol (VITAMIN D) 50 MCG (2000 UT) tablet Take 2,000 Units by mouth every morning.      clonazePAM (KLONOPIN) 0.5 MG tablet Take 0.5 tablets (0.25 mg total) by mouth at bedtime as needed for anxiety. 15 tablet 0   ELIQUIS 5 MG TABS tablet TAKE 1 TABLET BY MOUTH TWICE  DAILY (Patient taking differently: Take 5 mg by mouth 2 (two) times daily.) 180 tablet 3   furosemide (LASIX) 40 MG tablet Take 1 tablet (40 mg total) by mouth daily. 30 tablet 0   HYDROcodone-acetaminophen (NORCO) 5-325 MG tablet Take 1 tablet by mouth every 8 (eight) hours as needed (chronic back pain). 90 tablet 0   metoprolol succinate (TOPROL-XL) 25 MG 24 hr tablet TAKE 1 TABLET BY MOUTH ONCE  DAILY 30 tablet 0   potassium chloride SA (KLOR-CON M) 20 MEQ tablet Take 2 tablets (40 mEq total) by mouth daily. 60 tablet 0   senna-docusate (SENOKOT-S) 8.6-50 MG tablet Take 2 tablets by mouth at bedtime. 20 tablet 0   No current facility-administered medications on file prior to visit.     Review of Systems  Constitutional:  Negative for chills and fever.  Respiratory:  Negative for cough, shortness of breath and wheezing.   Cardiovascular:  Negative for chest pain, palpitations and leg swelling.  Gastrointestinal:  Negative for nausea.  Neurological:  Negative for light-headedness and headaches.       Objective:   Vitals:   09/18/23 1349  BP: 126/78  Pulse: 96  Temp: 98 F (36.7 C)  SpO2: 97%   BP Readings from Last 3 Encounters:  09/18/23 126/78  09/06/23 (!) 143/84  08/10/23 136/70   Wt Readings from Last 3 Encounters:  09/18/23 125 lb (56.7 kg)  09/06/23 130 lb 6.4 oz (59.1 kg)  08/10/23 143 lb (64.9 kg)   Body mass index is 21.46 kg/m.    Physical Exam Constitutional:      General: She is not in acute distress.    Appearance: Normal appearance.  HENT:     Head: Normocephalic and atraumatic.  Eyes:     Conjunctiva/sclera: Conjunctivae normal.  Cardiovascular:     Rate and Rhythm: Normal rate and regular rhythm.     Heart sounds: Normal heart sounds.  Pulmonary:     Effort:  Pulmonary effort is normal. No respiratory distress.     Breath sounds: Normal breath sounds. No wheezing.  Musculoskeletal:     Cervical back: Neck supple.     Right lower leg: Edema (Trace) present.     Left lower leg: Edema (Trace) present.  Lymphadenopathy:     Cervical: No cervical adenopathy.  Skin:    General: Skin is warm and dry.     Findings: No rash.  Neurological:     Mental Status: She is alert. Mental status is at baseline.  Psychiatric:        Mood and Affect: Mood normal.        Behavior: Behavior normal.        Lab Results  Component Value Date   WBC 4.7 09/06/2023   HGB 9.9 (L) 09/06/2023   HCT 30.9 (L) 09/06/2023   PLT 231 09/06/2023   GLUCOSE 87 09/06/2023   CHOL 159 06/20/2023   TRIG 69.0 06/20/2023   HDL 53.60 06/20/2023   LDLCALC 92 06/20/2023   ALT 12 09/04/2023   AST 24 09/04/2023   NA 139 09/06/2023   K 3.6 09/06/2023   CL 100 09/06/2023   CREATININE 0.92 09/06/2023   BUN 15 09/06/2023   CO2 31 09/06/2023   TSH 3.722 09/05/2023   INR 1.7 (H) 09/05/2023   HGBA1C 6.1 06/20/2023   ECHOCARDIOGRAM COMPLETE    ECHOCARDIOGRAM REPORT       Patient Name:   Molly Tucker Date of Exam: 09/05/2023 Medical Rec #:  [id number]       Height:       64.0 in Accession #:    [id number]      Weight:       134.0 lb Date of Birth:  [birth year]       BSA:          1.650 m Patient Age:    89 years        BP:           126/76 mmHg Patient Gender: F               HR:           86 bpm. Exam Location:  Inpatient  Procedure: 2D Echo, Color Doppler and Cardiac Doppler  Indications:    I50.31 Acute diastolic (congestive) heart failure   History:        Patient has prior history of Echocardiogram examinations, most                 recent 08/23/2021. CHF, Arrythmias:Atrial Fibrillation; Risk                 Factors:Hypertension and Dyslipidemia.   Sonographer:    Irving Burton Senior RDCS Referring Phys: [facility]  IMPRESSIONS   1. Left ventricular  ejection  fraction, by estimation, is 65 to 70%. The left ventricle has normal function. The left ventricle has no regional wall motion abnormalities. Left ventricular diastolic parameters are indeterminate.  2. Right ventricular systolic function is mildly reduced. The right ventricular size is mildly enlarged. There is moderately elevated pulmonary artery systolic pressure. The estimated right ventricular systolic pressure is 49.0 mmHg.  3. Left atrial size was severely dilated.  4. Right atrial size was severely dilated.  5. The mitral valve is normal in structure. Trivial mitral valve regurgitation.  6. The tricuspid valve is abnormal. Tricuspid valve regurgitation is severe.  7. The aortic valve is tricuspid. Aortic valve regurgitation is not visualized. Aortic valve sclerosis is present, with no evidence of aortic valve stenosis.  8. The inferior vena cava is normal in size with <50% respiratory variability, suggesting right atrial pressure of 8 mmHg.  FINDINGS  Left Ventricle: Left ventricular ejection fraction, by estimation, is 65 to 70%. The left ventricle has normal function. The left ventricle has no regional wall motion abnormalities. The left ventricular internal cavity size was small. There is no left  ventricular hypertrophy. Left ventricular diastolic parameters are indeterminate.  Right Ventricle: The right ventricular size is mildly enlarged. No increase in right ventricular wall thickness. Right ventricular systolic function is mildly reduced. There is moderately elevated pulmonary artery systolic pressure. The tricuspid  regurgitant velocity is 3.20 m/s, and with an assumed right atrial pressure of 8 mmHg, the estimated right ventricular systolic pressure is 49.0 mmHg.  Left Atrium: Left atrial size was severely dilated.  Right Atrium: Right atrial size was severely dilated.  Pericardium: There is no evidence of pericardial effusion.  Mitral Valve: The mitral valve is normal in  structure. Trivial mitral valve regurgitation.  Tricuspid Valve: The tricuspid valve is abnormal. Tricuspid valve regurgitation is severe.  Aortic Valve: The aortic valve is tricuspid. Aortic valve regurgitation is not visualized. Aortic valve sclerosis is present, with no evidence of aortic valve stenosis.  Pulmonic Valve: The pulmonic valve was grossly normal. Pulmonic valve regurgitation is trivial.  Aorta: The aortic root and ascending aorta are structurally normal, with no evidence of dilitation.  Venous: The inferior vena cava is normal in size with less than 50% respiratory variability, suggesting right atrial pressure of 8 mmHg.  IAS/Shunts: The interatrial septum was not well visualized.    LEFT VENTRICLE PLAX 2D LVIDd:         3.30 cm LVIDs:         1.90 cm LV PW:         0.90 cm LV IVS:        0.90 cm LVOT diam:     1.90 cm LV SV:         43 LV SV Index:   26 LVOT Area:     2.84 cm    RIGHT VENTRICLE RV S prime:     13.20 cm/s TAPSE (M-mode): 2.1 cm  LEFT ATRIUM              Index        RIGHT ATRIUM           Index LA diam:        4.70 cm  2.85 cm/m   RA Area:     54.80 cm LA Vol (A2C):   128.0 ml 77.57 ml/m  RA Volume:   260.00 ml 157.56 ml/m LA Vol (A4C):   139.0 ml 84.23 ml/m LA Biplane Vol: 135.0 ml 81.81  ml/m  AORTIC VALVE LVOT Vmax:   70.00 cm/s LVOT Vmean:  53.900 cm/s LVOT VTI:    0.151 m   AORTA Ao Root diam: 2.60 cm Ao Asc diam:  3.20 cm  TRICUSPID VALVE TR Peak grad:   41.0 mmHg TR Vmax:        320.00 cm/s   SHUNTS Systemic VTI:  0.15 m Systemic Diam: 1.90 cm  Epifanio Lesches MD Electronically signed by Epifanio Lesches MD Signature Date/Time: 09/05/2023/5:13:43 PM      Final      Assessment & Plan:    See Problem List for Assessment and Plan of chronic medical problems.

## 2023-09-18 ENCOUNTER — Ambulatory Visit (INDEPENDENT_AMBULATORY_CARE_PROVIDER_SITE_OTHER): Payer: Medicare Other | Admitting: Internal Medicine

## 2023-09-18 VITALS — BP 126/78 | HR 96 | Temp 98.0°F | Ht 64.0 in | Wt 125.0 lb

## 2023-09-18 DIAGNOSIS — I10 Essential (primary) hypertension: Secondary | ICD-10-CM | POA: Diagnosis not present

## 2023-09-18 DIAGNOSIS — E876 Hypokalemia: Secondary | ICD-10-CM | POA: Diagnosis not present

## 2023-09-18 DIAGNOSIS — I5033 Acute on chronic diastolic (congestive) heart failure: Secondary | ICD-10-CM

## 2023-09-18 DIAGNOSIS — I4819 Other persistent atrial fibrillation: Secondary | ICD-10-CM | POA: Diagnosis not present

## 2023-09-18 DIAGNOSIS — R600 Localized edema: Secondary | ICD-10-CM

## 2023-09-18 DIAGNOSIS — S22080D Wedge compression fracture of T11-T12 vertebra, subsequent encounter for fracture with routine healing: Secondary | ICD-10-CM

## 2023-09-18 LAB — BASIC METABOLIC PANEL
BUN: 21 mg/dL (ref 6–23)
CO2: 27 meq/L (ref 19–32)
Calcium: 9.8 mg/dL (ref 8.4–10.5)
Chloride: 103 meq/L (ref 96–112)
Creatinine, Ser: 1.05 mg/dL (ref 0.40–1.20)
GFR: 47.03 mL/min — ABNORMAL LOW (ref >60.00)
Glucose, Bld: 100 mg/dL — ABNORMAL HIGH (ref 70–99)
Potassium: 5.4 meq/L — ABNORMAL HIGH (ref 3.5–5.1)
Sodium: 137 meq/L (ref 135–145)

## 2023-09-18 MED ORDER — FUROSEMIDE 40 MG PO TABS: 20.0000 mg | ORAL_TABLET | Freq: Every day | ORAL | Status: DC

## 2023-09-18 MED ORDER — POTASSIUM CHLORIDE CRYS ER 20 MEQ PO TBCR: 20.0000 meq | EXTENDED_RELEASE_TABLET | Freq: Every day | ORAL | Status: DC

## 2023-09-18 NOTE — Assessment & Plan Note (Addendum)
Recent hospitalization for acute on chronic HFpEF Treated with IV Lasix with good diuresis Discharged home on Lasix 40 mg daily-prior to hospitalization was taking 20 mg as needed for leg edema She feels like she is not having the same amount of urination with the Lasix Weight stable at home approximately 123 Denies leg swelling Decrease Lasix to 20 mg daily, decrease potassium to 20 mill equivalents daily BMP today Referral to heart failure clinic

## 2023-09-18 NOTE — Assessment & Plan Note (Signed)
Chronic BP well controlled BMP Continue metoprolol 25 mg daily

## 2023-09-18 NOTE — Assessment & Plan Note (Signed)
Chronic cardioversion multiple times in the past In permanent A-fib Management per cardio-Dr. Graciela Husbands On metoprolol, eliquis

## 2023-09-18 NOTE — Assessment & Plan Note (Signed)
Had hypokalemia in the hospital which was repleted Currently taking Lasix 40 mg daily and potassium 40 mill equivalents daily BMP today Will decrease Lasix to 20 mg and potassium to 20 mill equivalents daily-will adjust if needed

## 2023-09-18 NOTE — Assessment & Plan Note (Signed)
Diagnosed 07/2023 Following with neurosurgery-no surgery/procedure recommended Continue hydrocodone 5-325 mg 1 tablet every 8 hours as needed, takes Tylenol for mild pain

## 2023-09-19 NOTE — Addendum Note (Signed)
Addended by: Pincus Sanes on: 09/19/2023 05:15 AM   Modules accepted: Orders

## 2023-09-24 ENCOUNTER — Other Ambulatory Visit: Payer: Self-pay | Admitting: Hematology and Oncology

## 2023-10-02 ENCOUNTER — Inpatient Hospital Stay: Payer: Medicare Other | Admitting: Hematology and Oncology

## 2023-10-25 ENCOUNTER — Telehealth: Payer: Self-pay | Admitting: Hematology and Oncology

## 2023-10-30 ENCOUNTER — Inpatient Hospital Stay: Payer: Medicare Other | Admitting: Hematology and Oncology

## 2023-11-01 ENCOUNTER — Inpatient Hospital Stay: Payer: Medicare Other | Attending: Hematology and Oncology | Admitting: Hematology and Oncology

## 2023-11-01 VITALS — BP 135/62 | HR 81 | Temp 97.8°F | Resp 16 | Wt 128.7 lb

## 2023-11-01 DIAGNOSIS — Z79811 Long term (current) use of aromatase inhibitors: Secondary | ICD-10-CM | POA: Insufficient documentation

## 2023-11-01 DIAGNOSIS — C50312 Malignant neoplasm of lower-inner quadrant of left female breast: Secondary | ICD-10-CM | POA: Insufficient documentation

## 2023-11-01 DIAGNOSIS — Z170 Estrogen receptor positive status [ER+]: Secondary | ICD-10-CM | POA: Insufficient documentation

## 2023-11-01 NOTE — Progress Notes (Signed)
 [facility]  [phone] [phone]     ID: Molly Tucker DOB: 1932/08/10  MR#: [id number]  RDW#:[id number]  Patient Care Team: Geofm Glade PARAS, MD as PCP - General (Internal Medicine) Fernande Elspeth BROCKS, MD as PCP - Cardiology (Cardiology) Fate Morna SAILOR, [facility] (Inactive) as Pharmacist (Pharmacist) Robinson Mayo, OD as Referring Physician (Optometry) Charmayne Molly, MD as Consulting Physician (Ophthalmology) Glean Stephane BROCKS, RN as Oncology Nurse Navigator Tyree Nanetta SAILOR, RN as Oncology Nurse Navigator Amber Stalls, MD   CHIEF COMPLAINT: estrogen receptor positive breast cancer  CURRENT TREATMENT: Anastrozole    INTERVAL HISTORY:  Molly Tucker is here for follow up on anastrazole. Molly Tucker denies any complaints. Molly Tucker is tolerating it very well. No changes in breast reported on self exam. Since her last visit, Molly Tucker had a fracture of the T6 vertebra and has finally healed.  In the past 3 weeks Molly Tucker did not have to use any pain medication.  Molly Tucker denies any changes in the breast.  Molly Tucker is getting ready to celebrate her 89th birthday in February.  Molly Tucker is taking anastrozole  as prescribed.  Molly Tucker will be due for a mammogram in March 2025 Rest of the pertinent 10 point ROS reviewed and negative.   COVID 19 VACCINATION STATUS: Pfizer x2, most recently 12/2019   HISTORY OF CURRENT ILLNESS: From the original intake note:  Molly Tucker herself palpated a retroareolar left breast mass. Molly Tucker underwent bilateral diagnostic mammography with tomography and left breast ultrasonography at [facility] on 11/02/2020 showing: breast density category B; palpable 2.3 cm indeterminate mass in retroareolar left breast at 8 o'clock, involving skin and underlying breast; normal-appearing left axillary lymph nodes.  Accordingly on 11/30/2020 Molly Tucker proceeded to biopsy of the left breast area in question. The pathology from this procedure ([id number]) showed: invasive ductal carcinoma, grade  1-2, with extracellular mucin; ductal carcinoma in situ. Prognostic indicators significant for: estrogen receptor, 100% positive and progesterone receptor, 100% positive, both with strong staining intensity. Proliferation marker Ki67 at 5%. HER2 negative by immunohistochemistry (1+).  Molly Tucker proceeded to left lumpectomy on 12/23/2020 under Dr. Aron. Pathology from the procedure ([id number]) showing: invasive ductal carcinoma with extracellular mucin, 2.7 cm, grade 1; ductal carcinoma in situ; margins not involved.  All three biopsied lymph nodes were negative for metastatic carcinoma (0/3).   Cancer Staging  Malignant neoplasm of lower-inner quadrant of left breast in female, estrogen receptor positive (HCC) Staging form: Breast, AJCC 8th Edition - Clinical stage from 11/30/2020: Stage IB (cT2, cN0, cM0, G2, ER+, PR+, HER2-) - Signed by Layla Sandria BROCKS, MD on 01/05/2021 Stage prefix: Initial diagnosis  The patient's subsequent history is as detailed below.   PAST MEDICAL HISTORY: Past Medical History:  Diagnosis Date   Arthritis    Atrial tachycardia (HCC)    Breast cancer (HCC) 11/30/2020   Carotid bruit 2005   right, carotid doppler: <39% occlusion   Complication of anesthesia    Dysrhythmia    Chronic A. Fib   Family history of breast cancer    Family history of GI tract cancer    Family history of stomach cancer    Hyperlipidemia    LDL goal =<120   Hyperplastic colonic polyp 2006   Dr. Avram, due 2016   Hypertension    Osteoporosis    S/P  biphosphonates x 5 yrs   Persistent atrial fibrillation (HCC)    PONV (postoperative nausea and vomiting)     PAST SURGICAL HISTORY: Past Surgical History:  Procedure  Laterality Date   BREAST LUMPECTOMY     BREAST LUMPECTOMY WITH SENTINEL LYMPH NODE BIOPSY Left 12/23/2020   Procedure: LEFT BREAST LUMPECTOMY WITH SENTINEL LYMPH NODE BX;  Surgeon: Aron Shoulders, MD;  Location: [facility] OR;  Service: General;  Laterality: Left;  RNFA, PEC  BLOCK; START TIME OF 1030 rm 2 per ivey   CARDIOVERSION N/A 03/31/2015   Procedure: CARDIOVERSION;  Surgeon: Jerel Balding, MD;  Location: [facility] ENDOSCOPY;  Service: Cardiovascular;  Laterality: N/A;   CARDIOVERSION N/A 12/21/2015   Procedure: CARDIOVERSION;  Surgeon: Redell GORMAN Shallow, MD;  Location: [facility] ENDOSCOPY;  Service: Cardiovascular;  Laterality: N/A;   CARDIOVERSION N/A 01/09/2017   Procedure: CARDIOVERSION;  Surgeon: Oneil JAYSON Parchment, MD;  Location: [facility] ENDOSCOPY;  Service: Cardiovascular;  Laterality: N/A;   CARDIOVERSION N/A 07/09/2019   Procedure: CARDIOVERSION;  Surgeon: Balding Jerel, MD;  Location: [facility] ENDOSCOPY;  Service: Cardiovascular;  Laterality: N/A;   CARDIOVERSION N/A 01/14/2020   Procedure: CARDIOVERSION;  Surgeon: Barbaraann Darryle Ned, MD;  Location: [facility] ENDOSCOPY;  Service: Cardiovascular;  Laterality: N/A;   CARDIOVERSION N/A 02/23/2020   Procedure: CARDIOVERSION;  Surgeon: Parchment Oneil JAYSON, MD;  Location: [facility] ENDOSCOPY;  Service: Cardiovascular;  Laterality: N/A;   COLONOSCOPY W/ POLYPECTOMY  10/30/2004   DILATION AND CURETTAGE OF UTERUS     MOUTH SURGERY  2009-2010   implants, Dr. Ysidro Daring    FAMILY HISTORY: Family History  Problem Relation Age of Onset   Coronary artery disease Father        MI in 59s   Coronary artery disease Sister        stent 2009   Breast cancer Sister 57       bilateral, diagnosed in other breast at age 76   Cancer Paternal Aunt        colon, possible breast cancer, dx 50s/60s   Arthritis Other        aunts   Stomach cancer Maternal Uncle        dx 62s   Intellectual disability Paternal Uncle    Cancer Paternal Aunt        gastrointestinal, dx 50s/60s   Stroke Neg Hx    Her mother died at age 8 with congestive heart failure. Her father died at age 1 from heart disease. Molly Tucker has 2 brothers and 4 sisters. Molly Tucker reports bilateral breast cancers in one sister-- the first at age 58, and the other breast at age 104.  Molly Tucker also has 2  paternal aunts that Molly Tucker believes had breast cancer   GYNECOLOGIC HISTORY:  No LMP recorded. Patient is postmenopausal. Menarche: 89 years old Age at first live birth: 89 years old GX P 2 LMP HRT never used  Hysterectomy? no BSO? no   SOCIAL HISTORY: (updated 12/2020)  Molly Tucker is currently retired from working for [employer].  Her husband is 2, has some speech difficulties, and has a colostomy.  He has no use of his right hand which means Molly Tucker ends up being the caregiver for that.  Also at home is the patient's son Donnice who drives a truck.  Son Alm lives in [place] and works in airline pilot.  The patient has 2 grandsons 1 granddaughter and one great granddaughter.  Molly Tucker belongs to the [employer] [facility]    ADVANCED DIRECTIVES: Molly Tucker has named her son Donnice as her healthcare power of attorney   HEALTH MAINTENANCE: Social History   Tobacco Use   Smoking status: Former    Current packs/day: 0.00  Types: Cigarettes    Quit date: 10/30/1976    Years since quitting: 47.0   Smokeless tobacco: Never   Tobacco comments:    Patient would ONLY smoke occasionally   Vaping Use   Vaping status: Never Used  Substance Use Topics   Alcohol use: No   Drug use: No     Colonoscopy:   PAP:  Bone density: Remote; took alendronate  at some point   No Known Allergies  Current Outpatient Medications  Medication Sig Dispense Refill   acetaminophen  (TYLENOL ) 500 MG tablet Take 500 mg by mouth every 6 (six) hours as needed for moderate pain.     anastrozole  (ARIMIDEX ) 1 MG tablet TAKE 1 TABLET BY MOUTH DAILY 90 tablet 3   Cholecalciferol  (VITAMIN D ) 50 MCG (2000 UT) tablet Take 2,000 Units by mouth every morning.     clonazePAM  (KLONOPIN ) 0.5 MG tablet Take 0.5 tablets (0.25 mg total) by mouth at bedtime as needed for anxiety. 15 tablet 0   ELIQUIS  5 MG TABS tablet TAKE 1 TABLET BY MOUTH TWICE  DAILY (Patient taking differently: Take 5 mg by mouth 2 (two) times daily.) 180 tablet 3    furosemide  (LASIX ) 40 MG tablet Take 0.5 tablets (20 mg total) by mouth daily.     HYDROcodone -acetaminophen  (NORCO) 5-325 MG tablet Take 1 tablet by mouth every 8 (eight) hours as needed (chronic back pain). 90 tablet 0   metoprolol  succinate (TOPROL -XL) 25 MG 24 hr tablet TAKE 1 TABLET BY MOUTH ONCE  DAILY 30 tablet 0   potassium chloride  SA (KLOR-CON  M) 20 MEQ tablet Take 1 tablet (20 mEq total) by mouth daily.     senna-docusate (SENOKOT-S) 8.6-50 MG tablet Take 2 tablets by mouth at bedtime. 20 tablet 0   No current facility-administered medications for this visit.    OBJECTIVE:  Vitals:   11/01/23 1043  BP: 135/62  Pulse: 81  Resp: 16  Temp: 97.8 F (36.6 C)  SpO2: 99%     Body mass index is 22.09 kg/m.   Wt Readings from Last 3 Encounters:  11/01/23 128 lb 11.2 oz (58.4 kg)  09/18/23 125 lb (56.7 kg)  09/06/23 130 lb 6.4 oz (59.1 kg)      ECOG FS:1 - Symptomatic but completely ambulatory  Physical Exam Constitutional:      Appearance: Normal appearance.  Chest:     Comments: Bilateral breasts inspected.  No palpable masses or regional adenopathy Musculoskeletal:        General: Normal range of motion.     Cervical back: Normal range of motion and neck supple. No rigidity.  Lymphadenopathy:     Cervical: No cervical adenopathy.  Skin:    General: Skin is warm and dry.  Neurological:     General: No focal deficit present.     Mental Status: Molly Tucker is alert.       LAB RESULTS:  CMP     Component Value Date/Time   NA 137 09/18/2023 1430   NA 142 09/28/2022 1607   K 5.4 (H) 09/18/2023 1430   CL 103 09/18/2023 1430   CO2 27 09/18/2023 1430   GLUCOSE 100 (H) 09/18/2023 1430   BUN 21 09/18/2023 1430   BUN 17 09/28/2022 1607   CREATININE 1.05 09/18/2023 1430   CREATININE 0.90 01/05/2021 1534   CREATININE 0.87 12/14/2015 1415   CALCIUM 9.8 09/18/2023 1430   PROT 6.2 (L) 09/04/2023 1503   PROT 6.1 11/08/2020 1601   ALBUMIN 3.3 (L) 09/04/2023 1503  ALBUMIN 3.9 11/08/2020 1601   AST 24 09/04/2023 1503   AST 23 01/05/2021 1534   ALT 12 09/04/2023 1503   ALT 20 01/05/2021 1534   ALKPHOS 59 09/04/2023 1503   BILITOT 1.0 09/04/2023 1503   BILITOT 0.9 01/05/2021 1534   GFRNONAA 60 (L) 09/06/2023 0505   GFRNONAA >60 01/05/2021 1534   GFRAA 68 12/14/2020 1239    No results found for: TOTALPROTELP, ALBUMINELP, A1GS, A2GS, BETS, BETA2SER, GAMS, MSPIKE, SPEI  Lab Results  Component Value Date   WBC 4.7 09/06/2023   NEUTROABS 5.3 06/20/2023   HGB 9.9 (L) 09/06/2023   HCT 30.9 (L) 09/06/2023   MCV 99.4 09/06/2023   PLT 231 09/06/2023    No results found for: LABCA2  No components found for: [id number]  No results for input(s): INR in the last 168 hours.  No results found for: LABCA2  No results found for: RJW800  No results found for: CAN125  No results found for: CAN153  No results found for: CA2729  No components found for: HGQUANT  No results found for: CEA1, CEA / No results found for: CEA1, CEA   No results found for: AFPTUMOR  No results found for: CHROMOGRNA  No results found for: KPAFRELGTCHN, LAMBDASER, KAPLAMBRATIO (kappa/lambda light chains)  No results found for: HGBA, HGBA2QUANT, HGBFQUANT, HGBSQUAN (Hemoglobinopathy evaluation)   No results found for: LDH  No results found for: IRON, TIBC, IRONPCTSAT (Iron and TIBC)  No results found for: FERRITIN  Urinalysis    Component Value Date/Time   COLORURINE YELLOW 10/31/2018 1041   APPEARANCEUR Sl Cloudy (A) 10/31/2018 1041   LABSPEC 1.020 10/31/2018 1041   PHURINE 5.5 10/31/2018 1041   GLUCOSEU NEGATIVE 10/31/2018 1041   HGBUR MODERATE (A) 10/31/2018 1041   BILIRUBINUR NEGATIVE 10/31/2018 1041   KETONESUR NEGATIVE 10/31/2018 1041   UROBILINOGEN 0.2 10/31/2018 1041   NITRITE NEGATIVE 10/31/2018 1041   LEUKOCYTESUR MODERATE (A) 10/31/2018 1041     STUDIES: No results  found.   ELIGIBLE FOR AVAILABLE RESEARCH PROTOCOL: no  ASSESSMENT: 89 y.o.  woman status post left breast lower inner quadrant biopsy 11/30/2020 for a clinical T2 N0, stage IB invasive ductal carcinoma, grade 1 or 2, estrogen and progesterone receptor positive, HER-2 not amplified, with an MIB-1 of 5%.  (1) status post left breast lower inner quadrant biopsy 12/23/2020 for a pT2 pN0, stage IB invasive ductal carcinoma, grade 1, with negative margins  (a) a total of 3 left axillary lymph nodes removed, all clear  (2) patient declines genetics testing  (3) patient declines adjuvant radiation  (4) anastrozole  started 01/10/2021   PLAN: Mauria is tolerating anastrozole  well and the plan will be to continue that for a total of 5 years.  Molly Tucker will complete this in March 2027 Mammogram March 2024 neg for malignancy. No concerns on PE today. Molly Tucker has previously expressed that Molly Tucker does not want to do any bone density scans.  Molly Tucker will RTC in 1 yr or sooner as needed.  Return to clinic in 1 year or sooner as needed Total time: 20 minutes  *Total Encounter Time as defined by the Centers for Medicare and Medicaid Services includes, in addition to the face-to-face time of a patient visit (documented in the note above) non-face-to-face time: obtaining and reviewing outside history, ordering and reviewing medications, tests or procedures, care coordination (communications with other health care professionals or caregivers) and documentation in the medical record.

## 2023-11-08 ENCOUNTER — Other Ambulatory Visit: Payer: Self-pay | Admitting: Internal Medicine

## 2023-11-19 NOTE — Progress Notes (Unsigned)
Subjective:    Patient ID: Molly Tucker, female    DOB: 04-29-1932, 89 y.o.   MRN: [id number]     HPI Molly Tucker is here for follow up of her chronic medical problems.  Has not taken any hydrocodone for the pain.  Sometime take tylenol.  Has pain in her middle back just right of the vertebrae.  If she does a lot it bothers her.  She denies pain with sleeping.   Weighs daily - has gained a little weight - weight slowly going up.  No edema.  Her appetite is better and she is eating more.    Medications and allergies reviewed with patient and updated if appropriate.  Current Outpatient Medications on File Prior to Visit  Medication Sig Dispense Refill   acetaminophen (TYLENOL) 500 MG tablet Take 500 mg by mouth every 6 (six) hours as needed for moderate pain.     anastrozole (ARIMIDEX) 1 MG tablet TAKE 1 TABLET BY MOUTH DAILY 90 tablet 3   Cholecalciferol (VITAMIN D) 50 MCG (2000 UT) tablet Take 2,000 Units by mouth every morning.     clonazePAM (KLONOPIN) 0.5 MG tablet Take 0.5 tablets (0.25 mg total) by mouth at bedtime as needed for anxiety. 15 tablet 0   ELIQUIS 5 MG TABS tablet TAKE 1 TABLET BY MOUTH TWICE  DAILY (Patient taking differently: Take 5 mg by mouth 2 (two) times daily.) 180 tablet 3   furosemide (LASIX) 40 MG tablet Take 0.5 tablets (20 mg total) by mouth daily.     metoprolol succinate (TOPROL-XL) 25 MG 24 hr tablet TAKE 1 TABLET BY MOUTH ONCE  DAILY 15 tablet 0   potassium chloride SA (KLOR-CON M) 20 MEQ tablet Take 1 tablet (20 mEq total) by mouth daily.     senna-docusate (SENOKOT-S) 8.6-50 MG tablet Take 2 tablets by mouth at bedtime. 20 tablet 0   No current facility-administered medications on file prior to visit.     Review of Systems  Constitutional:  Negative for fever.  Respiratory:  Positive for shortness of breath (occ when she wakes the dog - likely afib related). Negative for cough and wheezing.   Cardiovascular:  Positive for leg swelling.  Negative for chest pain and palpitations.  Musculoskeletal:  Positive for back pain.  Neurological:  Negative for dizziness, light-headedness and headaches.  Psychiatric/Behavioral:  Negative for dysphoric mood and sleep disturbance. The patient is not nervous/anxious.        Objective:   Vitals:   11/20/23 1320  BP: 120/86  Pulse: 87  SpO2: 97%   BP Readings from Last 3 Encounters:  11/20/23 120/86  11/01/23 135/62  09/18/23 126/78   Wt Readings from Last 3 Encounters:  11/20/23 132 lb 12.8 oz (60.2 kg)  11/01/23 128 lb 11.2 oz (58.4 kg)  09/18/23 125 lb (56.7 kg)   Body mass index is 22.8 kg/m.    Physical Exam Constitutional:      General: She is not in acute distress.    Appearance: Normal appearance.  HENT:     Head: Normocephalic and atraumatic.  Eyes:     Conjunctiva/sclera: Conjunctivae normal.  Cardiovascular:     Rate and Rhythm: Normal rate. Rhythm irregular.     Heart sounds: Normal heart sounds.  Pulmonary:     Effort: Pulmonary effort is normal. No respiratory distress.     Breath sounds: Normal breath sounds. No wheezing.  Musculoskeletal:     Cervical back: Neck supple.  Right lower leg: No edema.     Left lower leg: No edema.  Lymphadenopathy:     Cervical: No cervical adenopathy.  Skin:    General: Skin is warm and dry.     Findings: No rash.  Neurological:     Mental Status: She is alert. Mental status is at baseline.  Psychiatric:        Mood and Affect: Mood normal.        Behavior: Behavior normal.        Lab Results  Component Value Date   WBC 4.7 09/06/2023   HGB 9.9 (L) 09/06/2023   HCT 30.9 (L) 09/06/2023   PLT 231 09/06/2023   GLUCOSE 100 (H) 09/18/2023   CHOL 159 06/20/2023   TRIG 69.0 06/20/2023   HDL 53.60 06/20/2023   LDLCALC 92 06/20/2023   ALT 12 09/04/2023   AST 24 09/04/2023   NA 137 09/18/2023   K 5.4 (H) 09/18/2023   CL 103 09/18/2023   CREATININE 1.05 09/18/2023   BUN 21 09/18/2023   CO2 27  09/18/2023   TSH 3.722 09/05/2023   INR 1.7 (H) 09/05/2023   HGBA1C 6.1 06/20/2023     Assessment & Plan:    See Problem List for Assessment and Plan of chronic medical problems.

## 2023-11-19 NOTE — Patient Instructions (Addendum)
      Blood work was ordered.       Medications changes include :   None    A referral was ordered and someone will call you to schedule an appointment.     Return in about 6 months (around 05/19/2024) for follow up.

## 2023-11-20 ENCOUNTER — Encounter: Payer: Self-pay | Admitting: Internal Medicine

## 2023-11-20 ENCOUNTER — Ambulatory Visit: Payer: Medicare Other | Admitting: Internal Medicine

## 2023-11-20 VITALS — BP 120/86 | HR 87 | Ht 64.0 in | Wt 132.8 lb

## 2023-11-20 DIAGNOSIS — I5033 Acute on chronic diastolic (congestive) heart failure: Secondary | ICD-10-CM

## 2023-11-20 DIAGNOSIS — I4819 Other persistent atrial fibrillation: Secondary | ICD-10-CM | POA: Diagnosis not present

## 2023-11-20 DIAGNOSIS — E876 Hypokalemia: Secondary | ICD-10-CM | POA: Diagnosis not present

## 2023-11-20 DIAGNOSIS — I10 Essential (primary) hypertension: Secondary | ICD-10-CM | POA: Diagnosis not present

## 2023-11-20 DIAGNOSIS — M810 Age-related osteoporosis without current pathological fracture: Secondary | ICD-10-CM

## 2023-11-20 DIAGNOSIS — F5101 Primary insomnia: Secondary | ICD-10-CM

## 2023-11-20 DIAGNOSIS — S22080D Wedge compression fracture of T11-T12 vertebra, subsequent encounter for fracture with routine healing: Secondary | ICD-10-CM

## 2023-11-20 DIAGNOSIS — I5032 Chronic diastolic (congestive) heart failure: Secondary | ICD-10-CM | POA: Diagnosis not present

## 2023-11-20 LAB — CBC WITH DIFFERENTIAL/PLATELET
Basophils Absolute: 0.1 10*3/uL (ref 0.0–0.1)
Basophils Relative: 1.1 % (ref 0.0–3.0)
Eosinophils Absolute: 0.1 10*3/uL (ref 0.0–0.7)
Eosinophils Relative: 1.2 % (ref 0.0–5.0)
HCT: 38.6 % (ref 36.0–46.0)
Hemoglobin: 12.4 g/dL (ref 12.0–15.0)
Lymphocytes Relative: 25.1 % (ref 12.0–46.0)
Lymphs Abs: 1.3 10*3/uL (ref 0.7–4.0)
MCHC: 32.1 g/dL (ref 30.0–36.0)
MCV: 91.4 fL (ref 78.0–100.0)
Monocytes Absolute: 0.6 10*3/uL (ref 0.1–1.0)
Monocytes Relative: 10.9 % (ref 3.0–12.0)
Neutro Abs: 3.2 10*3/uL (ref 1.4–7.7)
Neutrophils Relative %: 61.7 % (ref 43.0–77.0)
Platelets: 212 10*3/uL (ref 150.0–400.0)
RBC: 4.22 Mil/uL (ref 3.87–5.11)
RDW: 13.3 % (ref 11.5–15.5)
WBC: 5.2 10*3/uL (ref 4.0–10.5)

## 2023-11-20 LAB — BASIC METABOLIC PANEL
BUN: 16 mg/dL (ref 6–23)
CO2: 28 meq/L (ref 19–32)
Calcium: 9.8 mg/dL (ref 8.4–10.5)
Chloride: 104 meq/L (ref 96–112)
Creatinine, Ser: 0.72 mg/dL (ref 0.40–1.20)
GFR: 73.88 mL/min (ref >60.00)
Glucose, Bld: 87 mg/dL (ref 70–99)
Potassium: 4.6 meq/L (ref 3.5–5.1)
Sodium: 139 meq/L (ref 135–145)

## 2023-11-20 LAB — VITAMIN D 25 HYDROXY (VIT D DEFICIENCY, FRACTURES): VITD: 52.86 ng/mL (ref 30.00–100.00)

## 2023-11-20 MED ORDER — ALENDRONATE SODIUM 70 MG PO TABS: 70.0000 mg | ORAL_TABLET | ORAL | 3 refills | Status: DC

## 2023-11-20 NOTE — Assessment & Plan Note (Signed)
Chronic Related to chronic diuretic use BMP today Continue potassium chloride 20 mEq

## 2023-11-20 NOTE — Assessment & Plan Note (Addendum)
Chronic Weight yourself daily Weight has been slowly going up, but her appetite has improved and she is eating more and this is likely slow weight gain Denies leg swelling, no unusual shortness of breath Continue Lasix to 20 mg daily, potassium 20 mEq  BMP today

## 2023-11-20 NOTE — Assessment & Plan Note (Signed)
 Chronic BP well controlled BMP Continue metoprolol 25 mg daily

## 2023-11-20 NOTE — Assessment & Plan Note (Signed)
Chronic Controlled Advised taking the medication only as needed and risk of memory issues, increased risk of falls Continue clonazepam 0.25 mg HS prn-she does not take this frequently and will continue to use it only on occasion

## 2023-11-20 NOTE — Assessment & Plan Note (Addendum)
Diagnosed 07/2023 She did see neurosurgery-no surgery/procedure recommended Still has pain but it is much improved Has pain with certain activities and sitting in certain places, no pain when sleeping Pain is tolerable Has not needed the hydrocodone in a long time Continue Tylenol as needed

## 2023-11-20 NOTE — Assessment & Plan Note (Signed)
Chronic cardioversion multiple times in the past In permanent A-fib Management per cardio-Dr. Graciela Husbands On metoprolol, eliquis BMP, CBC

## 2023-11-20 NOTE — Assessment & Plan Note (Addendum)
Chronic At 1 point she was on Fosamax Deferred dexa Discussed Fosamax, Reclast and Prolia as choices for treatment and how they work She is willing to try Fosamax since it is an expensive and easy Start Fosamax 70 mg weekly Will check vitamin D level

## 2023-11-22 ENCOUNTER — Encounter: Payer: Self-pay | Admitting: Internal Medicine

## 2023-12-13 ENCOUNTER — Telehealth (HOSPITAL_COMMUNITY): Payer: Self-pay

## 2023-12-13 NOTE — Telephone Encounter (Signed)
Called patient at [phone] to schedule a new chf appointment from referral received by Cheryll Cockayne, MD.   Front office was unable to reach patient on her telephone so office staff left patient a voice message with office telephone number to call to schedule an appointment.

## 2023-12-25 ENCOUNTER — Ambulatory Visit: Payer: Medicare Other | Admitting: Internal Medicine

## 2023-12-25 ENCOUNTER — Encounter: Payer: Self-pay | Admitting: Hematology and Oncology

## 2024-01-01 ENCOUNTER — Other Ambulatory Visit: Payer: Self-pay | Admitting: Internal Medicine

## 2024-01-01 ENCOUNTER — Encounter: Payer: Self-pay | Admitting: Internal Medicine

## 2024-01-05 MED ORDER — CLONAZEPAM 0.5 MG PO TABS: 0.2500 mg | ORAL_TABLET | Freq: Every evening | ORAL | 0 refills | Status: DC | PRN

## 2024-01-08 ENCOUNTER — Ambulatory Visit (HOSPITAL_COMMUNITY)
Admission: RE | Admit: 2024-01-08 | Discharge: 2024-01-08 | Disposition: A | Discharge status: Home / Self Care | Payer: Medicare Other | Source: Ambulatory Visit | Attending: Cardiology | Admitting: Cardiology

## 2024-01-08 VITALS — BP 110/70 | HR 78 | Wt 133.2 lb

## 2024-01-08 DIAGNOSIS — I10 Essential (primary) hypertension: Secondary | ICD-10-CM | POA: Diagnosis not present

## 2024-01-08 DIAGNOSIS — I4821 Permanent atrial fibrillation: Secondary | ICD-10-CM | POA: Insufficient documentation

## 2024-01-08 DIAGNOSIS — I429 Cardiomyopathy, unspecified: Secondary | ICD-10-CM | POA: Diagnosis present

## 2024-01-08 DIAGNOSIS — Z79899 Other long term (current) drug therapy: Secondary | ICD-10-CM | POA: Insufficient documentation

## 2024-01-08 DIAGNOSIS — I5032 Chronic diastolic (congestive) heart failure: Secondary | ICD-10-CM | POA: Insufficient documentation

## 2024-01-08 MED ORDER — METOPROLOL SUCCINATE ER 25 MG PO TB24: 25.0000 mg | ORAL_TABLET | Freq: Every day | ORAL | 3 refills | Status: DC

## 2024-01-08 NOTE — Patient Instructions (Signed)
 Good to see you today.  There has been no changes to your medications.  Your physician recommends that you schedule a follow-up appointment in: as needed.  If you have any questions or concerns before your next appointment please send [place] a message through [place] or call our office at [phone].    TO LEAVE A MESSAGE FOR THE NURSE SELECT OPTION 2, PLEASE LEAVE A MESSAGE INCLUDING: YOUR NAME DATE OF BIRTH CALL BACK NUMBER REASON FOR CALL**this is important as we prioritize the call backs  YOU WILL RECEIVE A CALL BACK THE SAME DAY AS LONG AS YOU CALL BEFORE 4:00 PM  At the [facility], you and your health needs are our priority. As part of our continuing mission to provide you with exceptional heart care, we have created designated Provider Care Teams. These Care Teams include your primary Cardiologist (physician) and Advanced Practice Providers (APPs- Physician Assistants and Nurse Practitioners) who all work together to provide you with the care you need, when you need it.   You may see any of the following providers on your designated Care Team at your next follow up: Dr Arvilla Meres Dr Marca Ancona Dr. Dorthula Nettles Dr. Clearnce Hasten Amy Filbert Schilder, NP Robbie Lis, [facility] [place], [place] Brynda Peon, NP [place] Lee, NP Clarisa Kindred, NP Karle Plumber, PharmD Enos Fling, PharmD   Please be sure to bring in all your medications bottles to every appointment.    Thank you for choosing [facility]

## 2024-01-08 NOTE — Progress Notes (Signed)
   ADVANCED HEART FAILURE NEW PATIENT CLINIC NOTE  Referring Physician: Pincus Sanes, MD  Primary Care: Pincus Sanes, MD Primary Cardiologist:  HPI: Molly Tucker is a 89 y.o. female with a PMH of permanent atrial fibrillation, HFpEF, recent compression fracture of the thoracic vertebra who presents for initial visit for further evaluation and treatment of heart failure/cardiomyopathy.     SUBJECTIVE: Patient recently admitted in November for acute on chronic diastolic heart failure. She was placed on IV diuretics with improvement. She reports that her main complaint is shortness of breath. She is able to stay active, takes care of herself, and gardens frequently. She stays active, though has to rest in the afternoon due to her back and symptoms. She takes lasix 20mg  daily which helps with the swelling.   PMH, current medications, allergies, social history, and family history reviewed in epic.  PHYSICAL EXAM: Vitals:   01/08/24 1517  BP: 110/70  Pulse: 78  SpO2: 97%   GENERAL: elderly, but appears younger than stated age PULM:  Normal work of breathing, clear to auscultation bilaterally. Respirations are unlabored.  CARDIAC:  JVP: mildly elevated with large V waves         Normal rate with regular rhythm. No murmurs, rubs or gallops.  1+ edema. Warm and well perfused extremities. ABDOMEN: Soft, non-tender, non-distended. NEUROLOGIC: Patient is oriented x3 with no focal or lateralizing neurologic deficits.    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 Discussed that her disease may be related to this versus age/atrial fibrillation. We discussed the workup and potential treatment for aTTR amyloid. We discussed the natural history of the disease as well, and the lack of meaningful tx options for HFpEF. After this discussion,  we agreed that she is doing fairly well and she would like to avoid further testing and medications unless needed.  - Instructed to follow up if needed or if symptoms worsen - Stable on current therapy, poor SGLT-2 candidate given age  Permanent atrial fibrillation: Unlikely to obtain rhythm control given biatrial enlargement. - Continue OAC, follow up with general cardiology  Follow up as needed  Clearnce Hasten, MD [facility] Mechanical Circulatory Support 01/08/24

## 2024-01-17 ENCOUNTER — Ambulatory Visit
Admission: RE | Admit: 2024-01-17 | Discharge: 2024-01-17 | Discharge status: Home / Self Care | Payer: Medicare Other | Source: Ambulatory Visit | Attending: Hematology and Oncology

## 2024-01-17 DIAGNOSIS — Z170 Estrogen receptor positive status [ER+]: Secondary | ICD-10-CM

## 2024-03-10 ENCOUNTER — Encounter: Payer: Self-pay | Admitting: Internal Medicine

## 2024-03-11 MED ORDER — FUROSEMIDE 20 MG PO TABS: 20.0000 mg | ORAL_TABLET | Freq: Every day | ORAL | 1 refills | Status: DC

## 2024-03-14 ENCOUNTER — Other Ambulatory Visit: Payer: Self-pay | Admitting: Internal Medicine

## 2024-05-06 ENCOUNTER — Other Ambulatory Visit: Payer: Self-pay | Admitting: Internal Medicine

## 2024-05-07 NOTE — Telephone Encounter (Signed)
 Pt saw Dr Zenaida on 01/08/24, last labs 11/20/23 Creat 0.72, age 89, weight 60.4kg, based on specified criteria pt is on appropriate dosage of Eliquis  5mg  BID for afib.  Will refill rx.

## 2024-05-19 DIAGNOSIS — R7303 Prediabetes: Secondary | ICD-10-CM | POA: Insufficient documentation

## 2024-05-19 NOTE — Progress Notes (Signed)
 Subjective:    Patient ID: Molly Tucker, female    DOB: 08/01/1932, 89 y.o.   MRN: [id number]     HPI Molly Tucker is here for follow up of her chronic medical problems.  For the past several months having increased back pain.  It has ben about one year since her compression fx.  It hurts in the same place.  It hurts more after a lot of activity.  Initially it hurt only at the end of the day and now it hurts throughout the day.  It does not hurt when she sleeps.   Walks dog most days.   Medications and allergies reviewed with patient and updated if appropriate.  Current Outpatient Medications on File Prior to Visit  Medication Sig Dispense Refill   acetaminophen  (TYLENOL ) 500 MG tablet Take 500 mg by mouth every 6 (six) hours as needed for moderate pain.     alendronate  (FOSAMAX ) 70 MG tablet Take 1 tablet (70 mg total) by mouth every 7 (seven) days. Take with a full glass of water on an empty stomach. 12 tablet 3   anastrozole  (ARIMIDEX ) 1 MG tablet TAKE 1 TABLET BY MOUTH DAILY 90 tablet 3   apixaban  (ELIQUIS ) 5 MG TABS tablet TAKE 1 TABLET BY MOUTH TWICE  DAILY 180 tablet 1   Cholecalciferol  (VITAMIN D ) 50 MCG (2000 UT) tablet Take 2,000 Units by mouth every morning.     clonazePAM  (KLONOPIN ) 0.5 MG tablet TAKE 0.5 TABLETS (0.25 MG TOTAL) BY MOUTH AT BEDTIME AS NEEDED FOR ANXIETY. 30 tablet 0   furosemide  (LASIX ) 20 MG tablet Take 1 tablet (20 mg total) by mouth daily. 90 tablet 1   metoprolol  succinate (TOPROL -XL) 25 MG 24 hr tablet TAKE 1 TABLET BY MOUTH ONCE  DAILY 90 tablet 2   No current facility-administered medications on file prior to visit.     Review of Systems  Constitutional:  Negative for fever.  Respiratory:  Negative for cough, shortness of breath and wheezing.   Cardiovascular:  Positive for leg swelling (controlled). Negative for chest pain and palpitations.  Musculoskeletal:  Positive for back pain.  Neurological:  Negative for light-headedness and  headaches.       Objective:   Vitals:   05/20/24 1321  BP: 126/78  Pulse: 70  Temp: 97.6 F (36.4 C)  SpO2: 100%   BP Readings from Last 3 Encounters:  05/20/24 126/78  01/08/24 110/70  11/20/23 120/86   Wt Readings from Last 3 Encounters:  05/20/24 131 lb (59.4 kg)  01/08/24 133 lb 3.2 oz (60.4 kg)  11/20/23 132 lb 12.8 oz (60.2 kg)   Body mass index is 22.49 kg/m.    Physical Exam Constitutional:      General: She is not in acute distress.    Appearance: Normal appearance.  HENT:     Head: Normocephalic and atraumatic.  Eyes:     Conjunctiva/sclera: Conjunctivae normal.  Cardiovascular:     Rate and Rhythm: Normal rate and regular rhythm.     Heart sounds: Normal heart sounds.  Pulmonary:     Effort: Pulmonary effort is normal. No respiratory distress.     Breath sounds: Normal breath sounds. No wheezing.  Musculoskeletal:        General: Tenderness (just right of upper- mid thoracic spine, no t- spine tenderness) present.     Cervical back: Neck supple.     Right lower leg: No edema.     Left lower leg: No edema.  Lymphadenopathy:     Cervical: No cervical adenopathy.  Skin:    General: Skin is warm and dry.     Findings: No rash.  Neurological:     Mental Status: She is alert. Mental status is at baseline.  Psychiatric:        Mood and Affect: Mood normal.        Behavior: Behavior normal.        Lab Results  Component Value Date   WBC 5.2 11/20/2023   HGB 12.4 11/20/2023   HCT 38.6 11/20/2023   PLT 212.0 11/20/2023   GLUCOSE 87 11/20/2023   CHOL 159 06/20/2023   TRIG 69.0 06/20/2023   HDL 53.60 06/20/2023   LDLCALC 92 06/20/2023   ALT 12 09/04/2023   AST 24 09/04/2023   NA 139 11/20/2023   K 4.6 11/20/2023   CL 104 11/20/2023   CREATININE 0.72 11/20/2023   BUN 16 11/20/2023   CO2 28 11/20/2023   TSH 3.722 09/05/2023   INR 1.7 (H) 09/05/2023   HGBA1C 6.1 06/20/2023     Assessment & Plan:    See Problem List for Assessment  and Plan of chronic medical problems.

## 2024-05-19 NOTE — Patient Instructions (Addendum)
      Blood work was ordered.   A xray of your back was ordered.     Medications changes include :   tramadol  50 mg twice daily as needed - take with tylenol        Return in about 6 months (around 11/20/2024) for follow up.

## 2024-05-20 ENCOUNTER — Ambulatory Visit: Payer: Self-pay | Admitting: Internal Medicine

## 2024-05-20 ENCOUNTER — Ambulatory Visit (INDEPENDENT_AMBULATORY_CARE_PROVIDER_SITE_OTHER)

## 2024-05-20 ENCOUNTER — Ambulatory Visit: Payer: Medicare Other | Admitting: Internal Medicine

## 2024-05-20 VITALS — BP 126/78 | HR 70 | Temp 97.6°F | Ht 64.0 in | Wt 131.0 lb

## 2024-05-20 DIAGNOSIS — I4819 Other persistent atrial fibrillation: Secondary | ICD-10-CM

## 2024-05-20 DIAGNOSIS — M810 Age-related osteoporosis without current pathological fracture: Secondary | ICD-10-CM | POA: Diagnosis not present

## 2024-05-20 DIAGNOSIS — E782 Mixed hyperlipidemia: Secondary | ICD-10-CM

## 2024-05-20 DIAGNOSIS — M546 Pain in thoracic spine: Secondary | ICD-10-CM | POA: Diagnosis not present

## 2024-05-20 DIAGNOSIS — I10 Essential (primary) hypertension: Secondary | ICD-10-CM | POA: Diagnosis not present

## 2024-05-20 DIAGNOSIS — G8929 Other chronic pain: Secondary | ICD-10-CM

## 2024-05-20 DIAGNOSIS — I5032 Chronic diastolic (congestive) heart failure: Secondary | ICD-10-CM | POA: Diagnosis not present

## 2024-05-20 DIAGNOSIS — R7303 Prediabetes: Secondary | ICD-10-CM | POA: Diagnosis not present

## 2024-05-20 DIAGNOSIS — F5101 Primary insomnia: Secondary | ICD-10-CM

## 2024-05-20 DIAGNOSIS — M549 Dorsalgia, unspecified: Secondary | ICD-10-CM | POA: Insufficient documentation

## 2024-05-20 LAB — COMPREHENSIVE METABOLIC PANEL WITH GFR
ALT: 13 U/L (ref 0–35)
AST: 23 U/L (ref 0–37)
Albumin: 4.3 g/dL (ref 3.5–5.2)
Alkaline Phosphatase: 52 U/L (ref 39–117)
BUN: 18 mg/dL (ref 6–23)
CO2: 32 meq/L (ref 19–32)
Calcium: 9.6 mg/dL (ref 8.4–10.5)
Chloride: 103 meq/L (ref 96–112)
Creatinine, Ser: 0.83 mg/dL (ref 0.40–1.20)
GFR: 62.07 mL/min (ref >60.00)
Glucose, Bld: 74 mg/dL (ref 70–99)
Potassium: 4.5 meq/L (ref 3.5–5.1)
Sodium: 139 meq/L (ref 135–145)
Total Bilirubin: 1.1 mg/dL (ref 0.2–1.2)
Total Protein: 7 g/dL (ref 6.0–8.3)

## 2024-05-20 LAB — CBC WITH DIFFERENTIAL/PLATELET
Basophils Absolute: 0.1 K/uL (ref 0.0–0.1)
Basophils Relative: 1.3 % (ref 0.0–3.0)
Eosinophils Absolute: 0.1 K/uL (ref 0.0–0.7)
Eosinophils Relative: 1.3 % (ref 0.0–5.0)
HCT: 41.7 % (ref 36.0–46.0)
Hemoglobin: 13.6 g/dL (ref 12.0–15.0)
Lymphocytes Relative: 28.5 % (ref 12.0–46.0)
Lymphs Abs: 1.3 K/uL (ref 0.7–4.0)
MCHC: 32.6 g/dL (ref 30.0–36.0)
MCV: 89.5 fl (ref 78.0–100.0)
Monocytes Absolute: 0.5 K/uL (ref 0.1–1.0)
Monocytes Relative: 11.5 % (ref 3.0–12.0)
Neutro Abs: 2.6 K/uL (ref 1.4–7.7)
Neutrophils Relative %: 57.4 % (ref 43.0–77.0)
Platelets: 174 K/uL (ref 150.0–400.0)
RBC: 4.65 Mil/uL (ref 3.87–5.11)
RDW: 16.8 % — ABNORMAL HIGH (ref 11.5–15.5)
WBC: 4.6 K/uL (ref 4.0–10.5)

## 2024-05-20 LAB — LIPID PANEL
Cholesterol: 172 mg/dL (ref 0–200)
HDL: 57.5 mg/dL (ref >39.00)
LDL Cholesterol: 98 mg/dL (ref 0–99)
NonHDL: 114.19
Total CHOL/HDL Ratio: 3
Triglycerides: 82 mg/dL (ref 0.0–149.0)
VLDL: 16.4 mg/dL (ref 0.0–40.0)

## 2024-05-20 LAB — HEMOGLOBIN A1C: Hgb A1c MFr Bld: 6.2 % (ref 4.6–6.5)

## 2024-05-20 MED ORDER — TRAMADOL HCL 50 MG PO TABS: 50.0000 mg | ORAL_TABLET | Freq: Two times a day (BID) | ORAL | 0 refills | Status: DC | PRN

## 2024-05-20 NOTE — Assessment & Plan Note (Addendum)
 Chronic Did not tolerate fosamax  - stopped it - ? Made her afib worse Deferred dexa Discussed Reclast and Prolia as choices for treatment and how they work Will check vitamin D  level Will retry fosamax  that she has at [facility]

## 2024-05-20 NOTE — Assessment & Plan Note (Signed)
 Chronic Lab Results  Component Value Date   HGBA1C 6.1 06/20/2023   Check a1c Low sugar / carb diet Stressed regular exercise

## 2024-05-20 NOTE — Assessment & Plan Note (Signed)
Chronic Regular exercise and healthy diet encouraged Check lipid panel  Continue lifestyle controlled

## 2024-05-20 NOTE — Assessment & Plan Note (Addendum)
 Subacute Started several months ago  Will get xray given multiple T compression and OP  - concern for fracture Taking tylenol  - not helping pain enough Has done well with tramadol  in the past - will give her a prescription for tramadol  50 mg bid prn for severe pain -- take with tylenol  -- discussed risk of constipation and memory issues - - advised to only take for severe pain

## 2024-05-20 NOTE — Assessment & Plan Note (Signed)
 Chronic Not weighing herself daily Leg edema controlled Denies leg swelling, no unusual shortness of breath Continue Lasix  to 20 mg daily, potassium 20 mEq ( skips occ) cmp

## 2024-05-20 NOTE — Assessment & Plan Note (Signed)
 Chronic cardioversion multiple times in the past In permanent A-fib Management per cardio-Dr. Fernande On metoprolol , eliquis  CMP, CBC

## 2024-05-20 NOTE — Assessment & Plan Note (Signed)
 Chronic Controlled Typically takes 2 tylenol  Advised taking the medication only as needed and risk of memory issues, increased risk of falls Continue clonazepam  0.25 mg HS prn-she does not take this frequently and will continue to use it only on occasion

## 2024-05-20 NOTE — Assessment & Plan Note (Signed)
 Chronic BP well controlled cmp Continue metoprolol  25 mg daily

## 2024-06-16 ENCOUNTER — Ambulatory Visit (INDEPENDENT_AMBULATORY_CARE_PROVIDER_SITE_OTHER): Payer: Medicare Other

## 2024-06-16 VITALS — Ht 64.0 in | Wt 131.0 lb

## 2024-06-16 DIAGNOSIS — Z0000 Encounter for general adult medical examination without abnormal findings: Secondary | ICD-10-CM

## 2024-06-16 NOTE — Progress Notes (Signed)
 Subjective:   Molly Tucker is a 89 y.o. who presents for a Medicare Wellness preventive visit.  As a reminder, Annual Wellness Visits don't include a physical exam, and some assessments may be limited, especially if this visit is performed virtually. We may recommend an in-person follow-up visit with your provider if needed.  Visit Complete: Virtual I connected with  Molly Tucker on 06/16/24 by a audio enabled telemedicine application and verified that I am speaking with the correct person using two identifiers.  Patient Location: Home  Provider Location: Home Office  I discussed the limitations of evaluation and management by telemedicine. The patient expressed understanding and agreed to proceed.  Vital Signs: Because this visit was a virtual/telehealth visit, some criteria may be missing or patient reported. Any vitals not documented were not able to be obtained and vitals that have been documented are patient reported.  VideoDeclined- This patient declined Librarian, academic. Therefore the visit was completed with audio only.  Persons Participating in Visit: Patient.  AWV Questionnaire: Yes: Patient Medicare AWV questionnaire was completed by the patient on 06/13/2024; I have confirmed that all information answered by patient is correct and no changes since this date.  Cardiac Risk Factors include: advanced age (>52men, >34 women);hypertension;Other (see comment);dyslipidemia, Risk factor comments: A-fib, CHF  NO CHANGES in Alcohol Screen from 05/20/2024-per pt    Objective:    Today's Vitals   06/16/24 1107  Weight: 131 lb (59.4 kg)  Height: 5' 4 (1.626 m)   Body mass index is 22.49 kg/m.     06/16/2024   11:35 AM 09/05/2023    7:00 AM 06/13/2023   11:36 AM 12/15/2020    1:38 PM 09/10/2020    3:55 PM 02/23/2020   10:27 AM 01/14/2020   11:15 AM  Advanced Directives  Does Patient Have a Medical Advance Directive? Yes No Yes Yes Yes Yes  Yes  Type of Estate agent of [place];Living will  Healthcare Power of [place];Living will Living will  Healthcare Power of [place];Living will Healthcare Power of [place];Living will  Does patient want to make changes to medical advance directive?    No - Patient declined No - Patient declined    Copy of Healthcare Power of Attorney in Chart? No - copy requested  No - copy requested   No - copy requested No - copy requested  Would patient like information on creating a medical advance directive?  No - Patient declined         Current Medications (verified) Outpatient Encounter Medications as of 06/16/2024  Medication Sig   acetaminophen  (TYLENOL ) 500 MG tablet Take 500 mg by mouth every 6 (six) hours as needed for moderate pain.   anastrozole  (ARIMIDEX ) 1 MG tablet TAKE 1 TABLET BY MOUTH DAILY   apixaban  (ELIQUIS ) 5 MG TABS tablet TAKE 1 TABLET BY MOUTH TWICE  DAILY   Cholecalciferol  (VITAMIN D ) 50 MCG (2000 UT) tablet Take 2,000 Units by mouth every morning.   clonazePAM  (KLONOPIN ) 0.5 MG tablet TAKE 0.5 TABLETS (0.25 MG TOTAL) BY MOUTH AT BEDTIME AS NEEDED FOR ANXIETY.   furosemide  (LASIX ) 20 MG tablet Take 1 tablet (20 mg total) by mouth daily.   metoprolol  succinate (TOPROL -XL) 25 MG 24 hr tablet TAKE 1 TABLET BY MOUTH ONCE  DAILY   traMADol  (ULTRAM ) 50 MG tablet Take 1 tablet (50 mg total) by mouth every 12 (twelve) hours as needed for severe pain (pain score 7-10) (chronic back pain).  No facility-administered encounter medications on file as of 06/16/2024.    Allergies (verified) Patient has no known allergies.   History: Past Medical History:  Diagnosis Date   Arthritis    Atrial tachycardia (HCC)    Breast cancer (HCC) 11/30/2020   Carotid bruit 2005   right, carotid doppler: <39% occlusion   Complication of anesthesia    Dysrhythmia    Chronic A. Fib   Family history of breast cancer    Family history of GI tract cancer    Family history of  stomach cancer    Hyperlipidemia    LDL goal =<120   Hyperplastic colonic polyp 2006   Dr. Avram, due 2016   Hypertension    Osteoporosis    S/P  biphosphonates x 5 yrs   Persistent atrial fibrillation (HCC)    PONV (postoperative nausea and vomiting)    Past Surgical History:  Procedure Laterality Date   BREAST LUMPECTOMY     BREAST LUMPECTOMY WITH SENTINEL LYMPH NODE BIOPSY Left 12/23/2020   Procedure: LEFT BREAST LUMPECTOMY WITH SENTINEL LYMPH NODE BX;  Surgeon: Aron Shoulders, MD;  Location: [facility] OR;  Service: General;  Laterality: Left;  RNFA, PEC BLOCK; START TIME OF 1030 rm 2 per ivey   CARDIOVERSION N/A 03/31/2015   Procedure: CARDIOVERSION;  Surgeon: Jerel Balding, MD;  Location: [facility] ENDOSCOPY;  Service: Cardiovascular;  Laterality: N/A;   CARDIOVERSION N/A 12/21/2015   Procedure: CARDIOVERSION;  Surgeon: Redell GORMAN Shallow, MD;  Location: [facility] ENDOSCOPY;  Service: Cardiovascular;  Laterality: N/A;   CARDIOVERSION N/A 01/09/2017   Procedure: CARDIOVERSION;  Surgeon: Oneil JAYSON Parchment, MD;  Location: [facility] ENDOSCOPY;  Service: Cardiovascular;  Laterality: N/A;   CARDIOVERSION N/A 07/09/2019   Procedure: CARDIOVERSION;  Surgeon: Balding Jerel, MD;  Location: [facility] ENDOSCOPY;  Service: Cardiovascular;  Laterality: N/A;   CARDIOVERSION N/A 01/14/2020   Procedure: CARDIOVERSION;  Surgeon: Barbaraann Darryle Ned, MD;  Location: [facility] ENDOSCOPY;  Service: Cardiovascular;  Laterality: N/A;   CARDIOVERSION N/A 02/23/2020   Procedure: CARDIOVERSION;  Surgeon: Parchment Oneil JAYSON, MD;  Location: [facility] ENDOSCOPY;  Service: Cardiovascular;  Laterality: N/A;   COLONOSCOPY W/ POLYPECTOMY  10/30/2004   DILATION AND CURETTAGE OF UTERUS     MOUTH SURGERY  2009-2010   implants, Dr. Ysidro Daring   Family History  Problem Relation Age of Onset   Coronary artery disease Father        MI in 69s   Coronary artery disease Sister        stent 2009   Breast cancer Sister 79       bilateral, diagnosed in other breast at age  30   Cancer Paternal Aunt        colon, possible breast cancer, dx 50s/60s   Arthritis Other        aunts   Stomach cancer Maternal Uncle        dx 33s   Intellectual disability Paternal Uncle    Cancer Paternal Aunt        gastrointestinal, dx 50s/60s   Stroke Neg Hx    Social History   Socioeconomic History   Marital status: Widowed    Spouse name: Not on file   Number of children: Not on file   Years of education: Not on file   Highest education level: 12th grade  Occupational History   Occupation: Retired    Associate Professor: [employer]  Tobacco Use   Smoking status: Former    Current packs/day: 0.00    Types: Cigarettes  Quit date: 10/30/1976    Years since quitting: 47.6   Smokeless tobacco: Never   Tobacco comments:    Patient would ONLY smoke occasionally   Vaping Use   Vaping status: Never Used  Substance and Sexual Activity   Alcohol use: No   Drug use: No   Sexual activity: Not on file  Other Topics Concern   Not on file  Social History Narrative   Regular exercise: yes: walking 30 minute once daily   Son lives with her/2025   Social Drivers of Health   Financial Resource Strain: Low Risk  (06/16/2024)   Overall Financial Resource Strain (CARDIA)    Difficulty of Paying Living Expenses: Not hard at all  Food Insecurity: No Food Insecurity (06/16/2024)   Hunger Vital Sign    Worried About Running Out of Food in the Last Year: Never true    Ran Out of Food in the Last Year: Never true  Transportation Needs: No Transportation Needs (06/16/2024)   PRAPARE - Administrator, Civil Service (Medical): No    Lack of Transportation (Non-Medical): No  Physical Activity: Sufficiently Active (06/16/2024)   Exercise Vital Sign    Days of Exercise per Week: 7 days    Minutes of Exercise per Session: 30 min  Stress: No Stress Concern Present (06/16/2024)   [employer] of Occupational Health - Occupational Stress Questionnaire    Feeling of Stress: Only a  little  Social Connections: Moderately Integrated (06/16/2024)   Social Connection and Isolation Panel    Frequency of Communication with Friends and Family: More than three times a week    Frequency of Social Gatherings with Friends and Family: More than three times a week    Attends Religious Services: More than 4 times per year    Active Member of [employer] or Organizations: Yes    Attends Banker Meetings: More than 4 times per year    Marital Status: Widowed    Tobacco Counseling Counseling given: Not Answered Tobacco comments: Patient would ONLY smoke occasionally     Clinical Intake:  Pre-visit preparation completed: Yes  Pain : No/denies pain     BMI - recorded: 22.49 Nutritional Status: BMI of 19-24  Normal Nutritional Risks: None Diabetes: No  Lab Results  Component Value Date   HGBA1C 6.2 05/20/2024   HGBA1C 6.1 06/20/2023   HGBA1C 5.8 12/22/2009     How often do you need to have someone help you when you read instructions, pamphlets, or other written materials from your doctor or pharmacy?: 1 - Never  Interpreter Needed?: No  Information entered by :: Samie Reasons, RMA   Activities of Daily Living      06/13/2024    2:32 PM 09/05/2023    7:00 AM  In your present state of health, do you have any difficulty performing the following activities:  Hearing? 0 0  Vision? 0 0  Difficulty concentrating or making decisions? 0 0  Walking or climbing stairs? 0   Dressing or bathing? 0   Doing errands, shopping? 0 0  Preparing Food and eating ? N   Using the Toilet? N   In the past six months, have you accidently leaked urine? N   Do you have problems with loss of bowel control? N   Managing your Medications? N   Managing your Finances? N   Housekeeping or managing your Housekeeping? N     Patient Care Team: Geofm Glade PARAS, MD as PCP -  General (Internal Medicine) Fernande Elspeth BROCKS, MD as PCP - Cardiology (Cardiology) Fate Morna SAILOR, [facility]  (Inactive) as Pharmacist (Pharmacist) Robinson Mayo, OD as Referring Physician (Optometry) Charmayne Molly, MD as Consulting Physician (Ophthalmology) Glean Stephane BROCKS, RN (Inactive) as Oncology Nurse Navigator Tyree Nanetta SAILOR, RN as Oncology Nurse Navigator   I have updated your Care Teams any recent Medical Services you may have received from other providers in the past year.     Assessment:   This is a routine wellness examination for Kat[place].  Hearing/Vision screen Hearing Screening - Comments:: Denies hearing difficulties   Vision Screening - Comments:: Wears eyeglasses/ Dr. Robinson   Goals Addressed             This Visit's Progress    Patient Stated   On track    My healthcare goal for 2024 is to maintain my current health status by continuing to eat healthy, stay independent, physically and socially active.       Depression Screen      05/20/2024    1:29 PM 05/20/2024    1:24 PM 07/23/2023    3:02 PM 06/19/2023   10:54 AM 06/13/2023   11:37 AM 08/18/2022    3:59 PM 06/27/2022    2:44 PM  PHQ 2/9 Scores  PHQ - 2 Score 0 0 0 0 0 0 0  PHQ- 9 Score 0   0 1 0 1    Fall Risk       06/13/2024    2:32 PM 05/20/2024    1:24 PM 06/19/2023   10:54 AM 06/11/2023   12:15 PM 08/18/2022    3:59 PM  Fall Risk   Falls in the past year? 0 0 0 0 1  Number falls in past yr:  0 0 0 1  Injury with Fall?  0 0 1 0  Risk for fall due to :  No Fall Risks No Fall Risks    Follow up Falls evaluation completed;Falls prevention discussed Falls evaluation completed Falls evaluation completed  Falls evaluation completed      Data saved with a previous flowsheet row definition    MEDICARE RISK AT HOME: Medicare Risk at Home Any stairs in or around the home?: (Patient-Rptd) No If so, are there any without handrails?: (Patient-Rptd) No Home free of loose throw rugs in walkways, pet beds, electrical cords, etc?: (Patient-Rptd) Yes Adequate lighting in your home to reduce risk of falls?:  (Patient-Rptd) Yes Life alert?: (Patient-Rptd) No Use of a cane, walker or w/c?: (Patient-Rptd) No Grab bars in the bathroom?: (Patient-Rptd) Yes Shower chair or bench in shower?: (Patient-Rptd) Yes Elevated toilet seat or a handicapped toilet?: (Patient-Rptd) Yes  TIMED UP AND GO:  Was the test performed?  No  Cognitive Function: Declined/Normal: No cognitive concerns noted by patient or family. Patient alert, oriented, able to answer questions appropriately and recall recent events. No signs of memory loss or confusion.        06/13/2023   11:42 AM 09/10/2020    3:57 PM  6CIT Screen  What Year? 0 points 0 points  What month? 0 points 0 points  What time? 0 points 0 points  Count back from 20 0 points 0 points  Months in reverse 0 points 0 points  Repeat phrase 0 points 0 points  Total Score 0 points 0 points    Immunizations Immunization History  Administered Date(s) Administered   Fluad Quad(high Dose 65+) 08/04/2019, 08/16/2020   Influenza Split 08/31/2011, 11/14/2012  Influenza Whole 09/10/2007   Influenza, High Dose Seasonal PF 11/21/2013, 10/19/2015, 08/29/2017   Influenza,inj,Quad PF,6+ Mos 09/09/2021, 08/18/2022   Influenza-Unspecified 07/30/2014, 08/01/2018   PFIZER(Purple Top)SARS-COV-2 Vaccination 11/19/2019, 12/10/2019   Pneumococcal Conjugate-13 08/04/2019   Tdap 08/22/2011, 06/21/2022    Screening Tests Health Maintenance  Topic Date Due   Zoster Vaccines- Shingrix (1 of 2) Never done   Pneumococcal Vaccine: 50+ Years (2 of 2 - PPSV23, PCV20, or PCV21) 09/29/2019   COVID-19 Vaccine (3 - Pfizer risk series) 01/07/2020   Medicare Annual Wellness (AWV)  06/12/2024   INFLUENZA VACCINE  05/30/2024   DEXA SCAN  09/10/2034 (Originally 12/25/1998)   DTaP/Tdap/Td (3 - Td or Tdap) 06/21/2032   HPV VACCINES  Aged Out   Meningococcal B Vaccine  Aged Out   Pneumococcal Vaccine  Discontinued    Health Maintenance  Health Maintenance Due  Topic Date Due    Zoster Vaccines- Shingrix (1 of 2) Never done   Pneumococcal Vaccine: 50+ Years (2 of 2 - PPSV23, PCV20, or PCV21) 09/29/2019   COVID-19 Vaccine (3 - Pfizer risk series) 01/07/2020   Medicare Annual Wellness (AWV)  06/12/2024   INFLUENZA VACCINE  05/30/2024   Health Maintenance Items Addressed: See Nurse Notes at the end of this note  Additional Screening:  Vision Screening: Recommended annual ophthalmology exams for early detection of glaucoma and other disorders of the eye. Would you like a referral to an eye doctor? No    Dental Screening: Recommended annual dental exams for proper oral hygiene  Community Resource Referral / Chronic Care Management: CRR required this visit?  No   CCM required this visit?  No   Plan:    I have personally reviewed and noted the following in the patient's chart:   Medical and social history Use of alcohol, tobacco or illicit drugs  Current medications and supplements including opioid prescriptions. Patient is currently taking opioid prescriptions. Information provided to patient regarding non-opioid alternatives. Patient advised to discuss non-opioid treatment plan with their provider. Functional ability and status Nutritional status Physical activity Advanced directives List of other physicians Hospitalizations, surgeries, and ER visits in previous 12 months Vitals Screenings to include cognitive, depression, and falls Referrals and appointments  In addition, I have reviewed and discussed with patient certain preventive protocols, quality metrics, and best practice recommendations. A written personalized care plan for preventive services as well as general preventive health recommendations were provided to patient.   Burch Marchuk L Kymiah Araiza, CMA   06/16/2024   After Visit Summary: (MyChart) Due to this being a telephonic visit, the after visit summary with patients personalized plan was offered to patient via MyChart   Notes: Nothing  significant to report at this time.

## 2024-06-16 NOTE — Patient Instructions (Signed)
 Ms. Whetsel , Thank you for taking time out of your busy schedule to complete your Annual Wellness Visit with me. I enjoyed our conversation and look forward to speaking with you again next year. I, as well as your care team,  appreciate your ongoing commitment to your health goals. Please review the following plan we discussed and let me know if I can assist you in the future. Your Game plan/ To Do List     Follow up Visits: We will see or speak with you next year for your Next Medicare AWV with our clinical staff Have you seen your provider in the last 6 months (3 months if uncontrolled diabetes)? Yes.  Last office visit on 05/20/2024.  Clinician Recommendations:  Aim for 30 minutes of exercise or brisk walking, 6-8 glasses of water, and 5 servings of fruits and vegetables each day. Keep up the good work.      This is a list of the screenings recommended for you:  Health Maintenance  Topic Date Due   Zoster (Shingles) Vaccine (1 of 2) Never done   Pneumococcal Vaccine for age over 82 (2 of 2 - PPSV23, PCV20, or PCV21) 09/29/2019   COVID-19 Vaccine (3 - Pfizer risk series) 01/07/2020   Medicare Annual Wellness Visit  06/12/2024   Flu Shot  05/30/2024   DEXA scan (bone density measurement)  09/10/2034*   DTaP/Tdap/Td vaccine (3 - Td or Tdap) 06/21/2032   HPV Vaccine  Aged Out   Meningitis B Vaccine  Aged Out   Pneumococcal Vaccine  Discontinued  *Topic was postponed. The date shown is not the original due date.    Advanced directives: (Copy Requested) Please bring a copy of your health care power of attorney and living will to the office to be added to your chart at your convenience. You can mail to [facility] [address] [place], [place] [address] or email to [email or web address] Advance Care Planning is important because it:  [x]  Makes sure you receive the medical care that is consistent with your values, goals, and preferences  [x]  It provides guidance to  your family and loved ones and reduces their decisional burden about whether or not they are making the right decisions based on your wishes.  Follow the link provided in your after visit summary or read over the paperwork we have mailed to you to help you started getting your Advance Directives in place. If you need assistance in completing these, please reach out to us  so that we can help you!  See attachments for Preventive Care and Fall Prevention Tips.

## 2024-06-20 ENCOUNTER — Other Ambulatory Visit: Payer: Self-pay | Admitting: Internal Medicine

## 2024-06-20 NOTE — Telephone Encounter (Signed)
 Okay for refill?

## 2024-07-23 ENCOUNTER — Other Ambulatory Visit: Payer: Self-pay | Admitting: Hematology and Oncology

## 2024-07-23 NOTE — Telephone Encounter (Signed)
 Per last OV in January 2025 continue Anastrazole.Andrea CHRISTELLA Plunk, RN

## 2024-09-08 ENCOUNTER — Other Ambulatory Visit: Payer: Self-pay | Admitting: Internal Medicine

## 2024-09-22 ENCOUNTER — Other Ambulatory Visit: Payer: Self-pay | Admitting: Internal Medicine

## 2024-10-08 ENCOUNTER — Other Ambulatory Visit: Payer: Self-pay | Admitting: Internal Medicine

## 2024-10-13 ENCOUNTER — Telehealth: Payer: Self-pay | Admitting: Hematology and Oncology

## 2024-10-13 NOTE — Telephone Encounter (Signed)
 Rescheduled patient from 11/03/24 to 11/13/24

## 2024-11-03 ENCOUNTER — Ambulatory Visit: Payer: Medicare Other | Admitting: Hematology and Oncology

## 2024-11-04 ENCOUNTER — Telehealth: Payer: Self-pay | Admitting: Hematology and Oncology

## 2024-11-04 NOTE — Telephone Encounter (Signed)
 I spoke with patient to reschedule appointment from 11/13/2024 to 11/26/2024.

## 2024-11-04 NOTE — Telephone Encounter (Signed)
 I spoke with patient and she is aware to come in on 11/13/2024 at 10:45 am instead of 1:30 pm for MD appointment.

## 2024-11-12 ENCOUNTER — Other Ambulatory Visit: Payer: Self-pay | Admitting: Cardiology

## 2024-11-13 ENCOUNTER — Inpatient Hospital Stay: Admitting: Hematology and Oncology

## 2024-11-13 NOTE — Telephone Encounter (Signed)
 This is a [facility]pt. Pt has not been seen in our office since 2023. Pt saw Dr. Zenaida last. Please address

## 2024-11-26 ENCOUNTER — Other Ambulatory Visit: Payer: Self-pay | Admitting: Internal Medicine

## 2024-11-27 ENCOUNTER — Inpatient Hospital Stay: Attending: Hematology and Oncology | Admitting: Hematology and Oncology

## 2024-11-27 VITALS — BP 142/64 | HR 83 | Temp 97.9°F | Resp 16 | Wt 134.5 lb

## 2024-11-27 DIAGNOSIS — Z170 Estrogen receptor positive status [ER+]: Secondary | ICD-10-CM | POA: Diagnosis not present

## 2024-11-27 DIAGNOSIS — C50312 Malignant neoplasm of lower-inner quadrant of left female breast: Secondary | ICD-10-CM

## 2024-11-27 MED ORDER — ANASTROZOLE 1 MG PO TABS: 1.0000 mg | ORAL_TABLET | Freq: Every day | ORAL | 3 refills | Status: AC

## 2024-11-30 ENCOUNTER — Other Ambulatory Visit: Payer: Self-pay | Admitting: Internal Medicine

## 2024-12-04 ENCOUNTER — Other Ambulatory Visit: Payer: Self-pay | Admitting: Hematology and Oncology

## 2024-12-04 DIAGNOSIS — C50312 Malignant neoplasm of lower-inner quadrant of left female breast: Secondary | ICD-10-CM

## 2024-12-04 DIAGNOSIS — Z1231 Encounter for screening mammogram for malignant neoplasm of breast: Secondary | ICD-10-CM

## 2024-12-05 ENCOUNTER — Telehealth (HOSPITAL_COMMUNITY): Payer: Self-pay

## 2024-12-05 NOTE — Telephone Encounter (Signed)
 Advanced Heart Failure Triage Encounter  Patient Name: Molly Tucker  Date of Call: 12/05/24  Problem: Patient states she has been taking Eliquis  5 mg now the refills says Eliquis  2.5 mg.Patient would like to know why it changed. It is coasting her $50 more for 2.5 mg.    Plan:  Sent to provider for further review   Rolin LOISE Height, CMA

## 2025-01-19 ENCOUNTER — Ambulatory Visit

## 2025-06-17 ENCOUNTER — Ambulatory Visit

## 2025-11-27 ENCOUNTER — Inpatient Hospital Stay: Admitting: Hematology and Oncology
